# Patient Record
Sex: Male | Born: 1955 | Race: White | Hispanic: No | Marital: Married | Smoking: Current every day smoker
Health system: Southern US, Community
[De-identification: ages and names within clinical notes are randomized; demographics above are authoritative.]

## PROBLEM LIST (undated history)

## (undated) DIAGNOSIS — E298 Other testicular dysfunction: Secondary | ICD-10-CM

## (undated) DIAGNOSIS — R911 Solitary pulmonary nodule: Secondary | ICD-10-CM

## (undated) DIAGNOSIS — F32A Depression, unspecified: Secondary | ICD-10-CM

## (undated) DIAGNOSIS — G629 Polyneuropathy, unspecified: Secondary | ICD-10-CM

## (undated) DIAGNOSIS — H9319 Tinnitus, unspecified ear: Secondary | ICD-10-CM

## (undated) DIAGNOSIS — J439 Emphysema, unspecified: Secondary | ICD-10-CM

## (undated) DIAGNOSIS — J432 Centrilobular emphysema: Secondary | ICD-10-CM

## (undated) DIAGNOSIS — R0681 Apnea, not elsewhere classified: Secondary | ICD-10-CM

## (undated) DIAGNOSIS — M1990 Unspecified osteoarthritis, unspecified site: Secondary | ICD-10-CM

## (undated) DIAGNOSIS — R7301 Impaired fasting glucose: Secondary | ICD-10-CM

## (undated) DIAGNOSIS — M40209 Unspecified kyphosis, site unspecified: Secondary | ICD-10-CM

## (undated) DIAGNOSIS — K219 Gastro-esophageal reflux disease without esophagitis: Secondary | ICD-10-CM

## (undated) DIAGNOSIS — F329 Major depressive disorder, single episode, unspecified: Secondary | ICD-10-CM

## (undated) DIAGNOSIS — M419 Scoliosis, unspecified: Secondary | ICD-10-CM

## (undated) DIAGNOSIS — E7800 Pure hypercholesterolemia, unspecified: Secondary | ICD-10-CM

## (undated) DIAGNOSIS — G43909 Migraine, unspecified, not intractable, without status migrainosus: Secondary | ICD-10-CM

## (undated) DIAGNOSIS — C801 Malignant (primary) neoplasm, unspecified: Secondary | ICD-10-CM

## (undated) DIAGNOSIS — I10 Essential (primary) hypertension: Secondary | ICD-10-CM

## (undated) DIAGNOSIS — G4730 Sleep apnea, unspecified: Secondary | ICD-10-CM

## (undated) HISTORY — DX: Polyneuropathy, unspecified: G62.9

## (undated) HISTORY — DX: Migraine, unspecified, not intractable, without status migrainosus: G43.909

## (undated) HISTORY — DX: Unspecified osteoarthritis, unspecified site: M19.90

## (undated) HISTORY — DX: Pure hypercholesterolemia, unspecified: E78.00

## (undated) HISTORY — DX: Centrilobular emphysema: J43.2

## (undated) HISTORY — DX: Unspecified kyphosis, site unspecified: M40.209

## (undated) HISTORY — DX: Malignant (primary) neoplasm, unspecified: C80.1

## (undated) HISTORY — DX: Apnea, not elsewhere classified: R06.81

## (undated) HISTORY — DX: Emphysema, unspecified: J43.9

## (undated) HISTORY — DX: Impaired fasting glucose: R73.01

## (undated) HISTORY — PX: TONSILLECTOMY: SUR1361

## (undated) HISTORY — DX: Tinnitus, unspecified ear: H93.19

## (undated) HISTORY — DX: Sleep apnea, unspecified: G47.30

## (undated) HISTORY — DX: Other testicular dysfunction: E29.8

## (undated) HISTORY — DX: Depression, unspecified: F32.A

## (undated) HISTORY — DX: Solitary pulmonary nodule: R91.1

## (undated) HISTORY — DX: Scoliosis, unspecified: M41.9

## (undated) HISTORY — DX: Essential (primary) hypertension: I10

## (undated) HISTORY — DX: Gastro-esophageal reflux disease without esophagitis: K21.9

---
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
## 2013-10-23 ENCOUNTER — Ambulatory Visit (INDEPENDENT_AMBULATORY_CARE_PROVIDER_SITE_OTHER): Payer: Medicare Other | Admitting: Podiatrist

## 2013-10-23 ENCOUNTER — Encounter: Payer: Self-pay | Admitting: Podiatrist

## 2013-10-23 VITALS — BP 132/89 | HR 82 | Resp 18

## 2013-10-23 DIAGNOSIS — L600 Ingrowing nail: Secondary | ICD-10-CM

## 2013-10-23 DIAGNOSIS — M79609 Pain in unspecified limb: Secondary | ICD-10-CM

## 2013-10-23 DIAGNOSIS — B351 Tinea unguium: Secondary | ICD-10-CM

## 2013-10-23 NOTE — Progress Notes (Signed)
   Subjective:    Patient ID: Jacob Graves, male    DOB: [birth year], 58 y.o.   MRN: [id number]  HPI MY TOENAIL ON THE RIGHT BIG TOE HAS BEEN BOTHERING FOR ABOUT A WEEK AND A HALF NOW AND I USED TOENAIL CLIPPERS AND THERE IS NO BURNING OR THROBBING AND NO NUMBNESS OR TINGLING AND IT IS SORE AND TENDER AND THE NAIL IS THICK  AND I BROKE THAT RIGHT TOE 20 YEARS AGO AND IT FELL OFF AND GREW BACK    Review of Systems  Constitutional:       NIGHT SWEATS  HENT: Positive for hearing loss.   Musculoskeletal:       MUSCLE PAIN   Skin: Positive for rash.  Neurological: Positive for headaches.  All other systems reviewed and are negative.      Objective:   Physical Exam Patient is awake, alert, and oriented x 3.  In no acute distress.  Vascular status is intact with palpable pedal pulses at 2/4 DP and PT bilateral and capillary refill time within normal limits. Neurological sensation is also intact bilaterally via Semmes Weinstein monofilament at 5/5 sites. Light touch, vibratory sensation, Achilles tendon reflex is intact. Dermatological exam reveals skin color, turger and texture as normal. No open lesions present.  Musculature intact with dorsiflexion, plantarflexion, inversion, eversion.  Mild discomfort medial aspect of the right great toenail is present. Thickness, discoloration, dystrophy and laterally deviated hallux nail is present. No active sign of infection is noted. Thick on the right second toe is also present.     Assessment & Plan:  Ingrowing toenail, mycotic toenail  Plan: Recommended debridement with thinning of the toenail at today's visit. Discussed that this is not beneficial we'll consider removal part of the toenail. He'll be seen back as needed for followup or if today's visit did offer relief.

## 2013-10-23 NOTE — Patient Instructions (Signed)
Ingrown Toenail An ingrown toenail occurs when the sharp edge of your toenail grows into the skin. Causes of ingrown toenails include toenails clipped too far back or poorly fitting shoes. Activities involving sudden stops (basketball, tennis) causing "toe jamming" may lead to an ingrown nail. HOME CARE INSTRUCTIONS   Soak the whole foot in warm soapy water for 20 minutes, 3 times per day.  You may lift the edge of the nail away from the sore skin by wedging a small piece of cotton under the corner of the nail. Be careful not to dig (traumatize) and cause more injury to the area.  Wear shoes that fit well. While the ingrown nail is causing problems, sandals may be beneficial.  Trim your toenails regularly and carefully. Cut your toenails straight across, not in a curve. This will prevent injury to the skin at the corners of the toenail.  Keep your feet clean and dry.  Crutches may be helpful early in treatment if walking is painful.  Antibiotics, if prescribed, should be taken as directed.  Return for a wound check in 2 days or as directed.  Only take over-the-counter or prescription medicines for pain, discomfort, or fever as directed by your caregiver. SEEK IMMEDIATE MEDICAL CARE IF:   You have a fever.  You have increasing pain, redness, swelling, or heat at the wound site.  Your toe is not better in 7 days. If conservative treatment is not successful, surgical removal of a portion or all of the nail may be necessary. MAKE SURE YOU:   Understand these instructions.  Will watch your condition.  Will get help right away if you are not doing well or get worse. Document Released: 02/13/2000 Document Revised: 05/10/2011 Document Reviewed: 02/07/2008 Exit[employer] Patient Information 2015 [employer]. This information is not intended to replace advice given to you by your health care provider. Make sure you discuss any questions you have with your health care provider.  

## 2014-03-01 HISTORY — PX: COLONOSCOPY: SHX174

## 2014-05-31 LAB — HM COLONOSCOPY

## 2015-07-08 ENCOUNTER — Encounter (INDEPENDENT_AMBULATORY_CARE_PROVIDER_SITE_OTHER): Payer: Self-pay

## 2015-07-08 ENCOUNTER — Ambulatory Visit (INDEPENDENT_AMBULATORY_CARE_PROVIDER_SITE_OTHER): Payer: Medicare Other | Admitting: Internal Medicine

## 2015-07-08 ENCOUNTER — Encounter: Payer: Self-pay | Admitting: Internal Medicine

## 2015-07-08 VITALS — BP 128/76 | HR 101 | Ht 70.0 in | Wt 161.2 lb

## 2015-07-08 DIAGNOSIS — J432 Centrilobular emphysema: Secondary | ICD-10-CM

## 2015-07-08 DIAGNOSIS — R911 Solitary pulmonary nodule: Secondary | ICD-10-CM

## 2015-07-08 DIAGNOSIS — Z720 Tobacco use: Secondary | ICD-10-CM | POA: Diagnosis not present

## 2015-07-08 DIAGNOSIS — Z87891 Personal history of nicotine dependence: Secondary | ICD-10-CM | POA: Insufficient documentation

## 2015-07-08 HISTORY — DX: Centrilobular emphysema: J43.2

## 2015-07-08 NOTE — Addendum Note (Signed)
Addended by: Osa Craver on: 07/08/2015 04:38 PM   Modules accepted: Orders

## 2015-07-08 NOTE — Progress Notes (Signed)
Subjective:    Patient ID: Jacob Graves, male    DOB: 04/09/1955, 60 y.o.   MRN: [id number] PCP Rochel Brome, MD  HPI   IOV 07/08/2015  Chief Complaint  Patient presents with  . Pulmonary Consult    Pt self referred for left lower lung nodule. Pt c/o sinus drainage, DOE, prod cough with clear mucus. Pt denies CP/tightness.      60 year old 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 He is a chronic smoker. He has sleep apnea and uses CPAP. His chronic cough and mild chronic shortness of breath and he has never paid attention to this. Most recently with the spring season his cough got a little bit worse or excess primary care physician present resulted in a chest x-ray was then led to a CT scan of the chest a few weeks ago he did this was done locally. I personally visualized the film on a CT from. He shows centrilobular emphysema along with a 1 cm smooth left lower lobe nodule. Therefore he is been referred for follow-up. He took a sample of Brio for a few weeks with due to his cough and it did help him but his wife says that he has significant cough that day and night.  He is a smoker. He tried to quit in the past with Chantix. This helped reduce the amount of cigarettes. However he had depression and his primary care physician a month ago put him on Zoloft), and took him off Chantix. He is back up on his cigarettes now. Some point he is interested in quitting smoking.     has a past medical history of Arthritis; Cancer ([place]); Sleep apnea; and Emphysema of lung ([place]).   reports that he has been smoking Cigarettes.  He has a 40 pack-year smoking history. He has never used smokeless tobacco.  Past Surgical History  Procedure Laterality Date  . Tonsillectomy      Allergies  Allergen Reactions  . Codeine   . Penicillins     Immunization History  Administered  Date(s) Administered  . Influenza,inj,Quad PF,36+ Mos 09/30/2014    Family History  Problem Relation Age of Onset  . Heart disease Father   . Stroke Father   . Breast cancer Maternal Grandmother   . Leukemia Mother   . Skin cancer Mother   . Skin cancer Brother      Current outpatient prescriptions:  .  atorvastatin (LIPITOR) 40 MG tablet, Take 40 mg by mouth daily., Disp: , Rfl:  .  baclofen (LIORESAL) 10 MG tablet, Take 10 mg by mouth 3 (three) times daily as needed. , Disp: , Rfl:  .  cetirizine (ZYRTEC) 10 MG tablet, Take 10 mg by mouth daily., Disp: , Rfl:  .  fluticasone furoate-vilanterol (BREO ELLIPTA) 100-25 MCG/INH AEPB, Inhale 1 puff into the lungs daily., Disp: , Rfl:  .  omeprazole (PRILOSEC) 20 MG capsule, Take 20 mg by mouth daily., Disp: , Rfl:  .  oxyCODONE (ROXICODONE) 15 MG immediate release tablet, Take 20 mg by mouth every 4 (four) hours as needed for pain., Disp: , Rfl:  .  Pseudoephedrine HCl, Deter, (NEXAFED) 30 MG TABA, Take by mouth as needed., Disp: , Rfl:  .  sertraline (ZOLOFT) 100 MG tablet, Take 100 mg by mouth daily., Disp: , Rfl:      Review of Systems  Constitutional: Negative for fever and unexpected weight change.  HENT: Positive for congestion. Negative for dental problem, ear pain, nosebleeds, postnasal drip, rhinorrhea, sinus pressure, sneezing, sore throat and trouble swallowing.   Eyes: Negative for redness and itching.  Respiratory: Positive for cough. Negative for chest tightness, shortness of breath and wheezing.   Cardiovascular: Negative for palpitations and leg swelling.  Gastrointestinal: Negative for nausea and vomiting.  Genitourinary: Negative for dysuria.  Musculoskeletal: Negative for joint swelling.  Skin: Negative for rash.  Neurological: Negative for headaches.  Hematological: Does not bruise/bleed easily.  Psychiatric/Behavioral: Negative for dysphoric mood. The patient is not nervous/anxious.        Objective:    Physical Exam  Constitutional: He is oriented to person, place, and time. He appears well-developed and well-nourished. No distress.  HENT:  Head: Normocephalic and atraumatic.  Right Ear: External ear normal.  Left Ear: External ear normal.  Mouth/Throat: Oropharynx is clear and moist. No oropharyngeal exudate.  Eyes: Conjunctivae and EOM are normal. Pupils are equal, round, and reactive to light. Right eye exhibits no discharge. Left eye exhibits no discharge. No scleral icterus.  Neck: Normal range of motion. Neck supple. No JVD present. No tracheal deviation present. No thyromegaly present.  Cardiovascular: Normal rate, regular rhythm and intact distal pulses.  Exam reveals no gallop and no friction rub.   No murmur heard. Pulmonary/Chest: Effort normal and breath sounds normal. No respiratory distress. He has no wheezes. He has no rales. He exhibits no tenderness.  Coarse BS   Abdominal: Soft. Bowel sounds are normal. He exhibits no distension and no mass. There is no tenderness. There is no rebound and no guarding.  Musculoskeletal: Normal range of motion. He exhibits no edema or tenderness.  Mildly kyphotic  Lymphadenopathy:    He has no cervical adenopathy.  Neurological: He is alert and oriented to person, place, and time. He has normal reflexes. No cranial nerve deficit. Coordination normal.  Skin: Skin is warm and dry. No rash noted. He is not diaphoretic. No erythema. No pallor.  Psychiatric: He has a normal mood and affect. His behavior is normal. Judgment and thought content normal.  flast affect  Nursing note and vitals reviewed.   Filed Vitals:   07/08/15 1523  BP: 128/76  Pulse: 101  Height: 5\' 10"  (1.778 m)  Weight: 161 lb 3.2 oz (73.12 kg)  SpO2: 95%         Assessment & Plan:     ICD-9-CM ICD-10-CM   1. Nodule of left lung 793.11 R91.1   2. Smoking history V15.82 Z72.0   3. Centrilobular emphysema (HCC) 492.8 J43.2     #lung nodule  = age and smoking  history make the nodule concerning for early stage lung cancer - however, smooth shape and small size make it less concerning for lung cancer  - still we cannot be absolutely certain if this is lung cancer or not - do PET scan to help [place] with probability assessment  #Smoking history annd cough and emphysema seen on CT  - do full PFT - at [place] if you want - will discuss quit smoking at followup - consider alpha 1 at followp  #Followup =- return to see me or one my nurse practitioners next 10 days but after completing above    Dr. Brand Males, M.D., [facility].C.P Pulmonary and Critical Care Medicine Staff Physician [place] [facility] Pager: [phone], If no answer or between  15:00h -  7:00h: call [phone]  07/08/2015 4:03 PM

## 2015-07-08 NOTE — Patient Instructions (Addendum)
ICD-9-CM ICD-10-CM   1. Nodule of left lung 793.11 R91.1   2. Smoking history V15.82 Z72.0   3. Centrilobular emphysema (HCC) 492.8 J43.2    #lung nodule  = age and smoking history make the nodule concerning for early stage lung cancer - however, smooth shape and small size make it less concerning for lung cancer  - still we cannot be absolutely certain if this is lung cancer or not - do PET scan to help [place] with probability assessment  #Smoking history annd cough and emphysema seen on CT  - do full PFT - at [place] if you want will discuss quit smoking at followup - consider alpha 1 at followp    #Followup =- return to see me or one my nurse practitioners next 10 days but after completing above

## 2015-07-15 ENCOUNTER — Ambulatory Visit (HOSPITAL_COMMUNITY)
Admission: RE | Admit: 2015-07-15 | Discharge: 2015-07-15 | Disposition: A | Discharge status: Home / Self Care | Payer: Medicare Other | Source: Ambulatory Visit | Attending: Internal Medicine | Admitting: Internal Medicine

## 2015-07-15 DIAGNOSIS — Z720 Tobacco use: Secondary | ICD-10-CM | POA: Insufficient documentation

## 2015-07-15 DIAGNOSIS — J432 Centrilobular emphysema: Secondary | ICD-10-CM

## 2015-07-15 DIAGNOSIS — Z87891 Personal history of nicotine dependence: Secondary | ICD-10-CM

## 2015-07-15 DIAGNOSIS — R911 Solitary pulmonary nodule: Secondary | ICD-10-CM | POA: Diagnosis not present

## 2015-07-15 DIAGNOSIS — J449 Chronic obstructive pulmonary disease, unspecified: Secondary | ICD-10-CM | POA: Insufficient documentation

## 2015-07-15 LAB — PULMONARY FUNCTION TEST
DL/VA % pred: 112 %
DL/VA: 5.21 ml/min/mmHg/L
DLCO unc % pred: 71 %
DLCO unc: 22.97 ml/min/mmHg
FEF 25-75 Post: 0.95 L/sec
FEF 25-75 Pre: 1.28 L/sec
FEF2575-%Change-Post: -25 %
FEF2575-%Pred-Post: 30 %
FEF2575-%Pred-Pre: 41 %
FEV1-%CHANGE-POST: -20 %
FEV1-%PRED-POST: 48 %
FEV1-%PRED-PRE: 61 %
FEV1-PRE: 2.25 L
FEV1-Post: 1.78 L
FEV1FVC-%Change-Post: -16 %
FEV1FVC-%Pred-Pre: 84 %
FEV6-%Change-Post: -4 %
FEV6-%PRED-PRE: 74 %
FEV6-%Pred-Post: 71 %
FEV6-POST: 3.31 L
FEV6-Pre: 3.45 L
FEV6FVC-%Change-Post: 1 %
FEV6FVC-%PRED-POST: 103 %
FEV6FVC-%Pred-Pre: 102 %
FVC-%Change-Post: -5 %
FVC-%PRED-PRE: 72 %
FVC-%Pred-Post: 68 %
FVC-POST: 3.33 L
FVC-PRE: 3.51 L
POST FEV6/FVC RATIO: 99 %
PRE FEV1/FVC RATIO: 64 %
PRE FEV6/FVC RATIO: 98 %
Post FEV1/FVC ratio: 54 %
RV % pred: 218 %
RV: 4.81 L
TLC % PRED: 115 %
TLC: 8.04 L

## 2015-07-15 LAB — GLUCOSE, CAPILLARY: Glucose-Capillary: 131 mg/dL — ABNORMAL HIGH (ref 65–99)

## 2015-07-15 MED ORDER — FLUDEOXYGLUCOSE F - 18 (FDG) INJECTION
7.7000 | Freq: Once | INTRAVENOUS | Status: AC | PRN
  Administered 2015-07-15: 7.7 via INTRAVENOUS

## 2015-07-15 MED ORDER — ALBUTEROL SULFATE (2.5 MG/3ML) 0.083% IN NEBU
2.5000 mg | INHALATION_SOLUTION | Freq: Once | RESPIRATORY_TRACT | Status: AC
  Administered 2015-07-15: 2.5 mg via RESPIRATORY_TRACT

## 2015-07-16 ENCOUNTER — Telehealth: Payer: Self-pay | Admitting: Internal Medicine

## 2015-07-16 NOTE — Telephone Encounter (Signed)
Spoke with pt and gave results and recommendations. MR happened to have cancellation opening on schedule for tomorrow so pt is coming in 07/17/15 @ 9:30am.  Routed to MR as FYI.

## 2015-07-16 NOTE — Telephone Encounter (Addendum)
  Triage: Let Trude Mcburney know that PET scan has low probability (but not zero) for cancer. Also, I might have him see [place] or myself for fu to decide on watching v doing ENB. This is instead of TP 07/22/15. When is my first avail? When is RB first avail?   Also PFT - fev1 61% - modereate copd.   Nm Pet Image Initial (pi) Skull Base To Thigh  07/16/2015  CLINICAL DATA:  Initial initial treatment strategy for pulmonary nodule. EXAM: NUCLEAR MEDICINE PET SKULL BASE TO THIGH TECHNIQUE: 7.7 mCi F-18 FDG was injected intravenously. Full-ring PET imaging was performed from the skull base to thigh after the radiotracer. CT data was obtained and used for attenuation correction and anatomic localization. FASTING BLOOD GLUCOSE:  Value: 131 mg/dl COMPARISON:  CT 06/26/2015 FINDINGS: NECK No hypermetabolic lymph nodes in the neck. CHEST The nodule of concern in the LEFT lower lobe measures 10 mm (image 55, series 6) and has faint associated metabolic activity (SUV max equals 0.9). No additional suspicious pulmonary nodules. No hypermetabolic mediastinal lymph nodes. ABDOMEN/PELVIS No abnormal hypermetabolic activity within the liver, pancreas, adrenal glands, or spleen. No hypermetabolic lymph nodes in the abdomen or pelvis. SKELETON No focal hypermetabolic activity to suggest skeletal metastasis. Round sclerotic lesion in the RIGHT iliac bone measures 7 mm. This does not have associated metabolic activity and therefore benign. IMPRESSION: 1. Very low metabolic activity in the LEFT lower lobe pulmonary nodule suggest benign etiology. As some low-grade neoplasms can have low metabolic activity, recommend follow-up CT in 3 months to demonstrate volume stability. 2. No evidence of metastatic disease. Electronically Signed   By: Suzy Bouchard M.D.   On: 07/16/2015 10:39

## 2015-07-16 NOTE — Telephone Encounter (Signed)
Thanks

## 2015-07-17 ENCOUNTER — Other Ambulatory Visit: Payer: Medicare Other

## 2015-07-17 ENCOUNTER — Ambulatory Visit (INDEPENDENT_AMBULATORY_CARE_PROVIDER_SITE_OTHER): Payer: Medicare Other | Admitting: Internal Medicine

## 2015-07-17 ENCOUNTER — Encounter: Payer: Self-pay | Admitting: Internal Medicine

## 2015-07-17 VITALS — BP 128/78 | HR 91 | Ht 70.0 in | Wt 162.2 lb

## 2015-07-17 DIAGNOSIS — R911 Solitary pulmonary nodule: Secondary | ICD-10-CM | POA: Diagnosis not present

## 2015-07-17 DIAGNOSIS — Z720 Tobacco use: Secondary | ICD-10-CM

## 2015-07-17 DIAGNOSIS — Z23 Encounter for immunization: Secondary | ICD-10-CM | POA: Diagnosis not present

## 2015-07-17 DIAGNOSIS — Z87891 Personal history of nicotine dependence: Secondary | ICD-10-CM

## 2015-07-17 DIAGNOSIS — J449 Chronic obstructive pulmonary disease, unspecified: Secondary | ICD-10-CM

## 2015-07-17 NOTE — Progress Notes (Signed)
PCP Rochel Brome, MD  HPI   IOV 07/08/2015  Chief Complaint  Patient presents with  . Pulmonary Consult    Pt self referred for left lower lung nodule. Pt c/o sinus drainage, DOE, prod cough with clear mucus. Pt denies CP/tightness.      60 year old male [place]. 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 He is a chronic smoker. He has sleep apnea and uses CPAP. His chronic cough and mild chronic shortness of breath and he has never paid attention to this. Most recently with the spring season his cough got a little bit worse or excess primary care physician present resulted in a chest x-ray was then led to a CT scan of the chest a few weeks ago he did this was done locally. I personally visualized the film on a CT from. He shows centrilobular emphysema along with a 1 cm smooth left lower lobe nodule. Therefore he is been referred for follow-up. He took a sample of Brio for a few weeks with due to his cough and it did help him but his wife says that he has significant cough that day and night.  He is a smoker. He tried to quit in the past with Chantix. This helped reduce the amount of cigarettes. However he had depression and his primary care physician a month ago put him on Zoloft), and took him off Chantix. He is back up on his cigarettes now. Some point he is interested in quitting smoking.   OV 07/17/2015  Chief Complaint  Patient presents with  . Follow-up    PET scan review. Pt c/o occasional cough and sinus drainage, some SOB with exertion. Pt denies wheeze/CP/tightness. Pt has had continued weight loss >83mos.     Presents with his wife to review results and to discuss smoking  #Smoking: He continues to smoke. It is very difficult for him to quit. He is not taking Chantix because his primary care physician put him on Zoloft and did not want to add Chantix to this. He has tried  Wellbutrin in the past. He is willing to wean down to the extent possible his smoking and review this in 3 months at follow-up. He has no suicidal or homicidal behavior  #COPD: PFTs reviewed shows moderate COPD with low DLCO of 70%. He is only on albuterol as needed, He has not had an alpha 1 check  Immunization History  Administered Date(s) Administered  . Influenza,inj,Quad PF,36+ Mos 09/30/2014    #Lung nodule = PET scan as follows   Nm Pet Image Initial (pi) Skull Base To Thigh  07/16/2015  CLINICAL DATA:  Initial initial treatment strategy for pulmonary nodule. EXAM: NUCLEAR MEDICINE PET SKULL BASE TO THIGH TECHNIQUE: 7.7 mCi F-18 FDG was injected intravenously. Full-ring PET imaging was performed from the skull base to thigh after the radiotracer. CT data was obtained and used for attenuation correction and anatomic localization. FASTING BLOOD GLUCOSE:  Value: 131 mg/dl COMPARISON:  CT 06/26/2015 FINDINGS: NECK No hypermetabolic lymph nodes in the neck. CHEST The nodule of concern in the LEFT lower lobe measures 10 mm (image 55, series 6) and has faint associated metabolic activity (SUV max equals 0.9). No additional suspicious pulmonary nodules. No hypermetabolic mediastinal lymph nodes. ABDOMEN/PELVIS No abnormal hypermetabolic activity within the liver, pancreas, adrenal glands, or spleen. No hypermetabolic lymph nodes in the abdomen or pelvis. SKELETON No focal  hypermetabolic activity to suggest skeletal metastasis. Round sclerotic lesion in the RIGHT iliac bone measures 7 mm. This does not have associated metabolic activity and therefore benign. IMPRESSION: 1. Very low metabolic activity in the LEFT lower lobe pulmonary nodule suggest benign etiology. As some low-grade neoplasms can have low metabolic activity, recommend follow-up CT in 3 months to demonstrate volume stability. 2. No evidence of metastatic disease. Electronically Signed   By: Suzy Bouchard M.D.   On: 07/16/2015 10:39         Current outpatient prescriptions:  .  atorvastatin (LIPITOR) 40 MG tablet, Take 40 mg by mouth daily., Disp: , Rfl:  .  baclofen (LIORESAL) 10 MG tablet, Take 10 mg by mouth 3 (three) times daily as needed. , Disp: , Rfl:  .  cetirizine (ZYRTEC) 10 MG tablet, Take 10 mg by mouth daily., Disp: , Rfl:  .  fluticasone furoate-vilanterol (BREO ELLIPTA) 100-25 MCG/INH AEPB, Inhale 1 puff into the lungs daily., Disp: , Rfl:  .  levofloxacin (LEVAQUIN) 750 MG tablet, Take 750 mg by mouth daily., Disp: , Rfl:  .  omeprazole (PRILOSEC) 20 MG capsule, Take 20 mg by mouth daily., Disp: , Rfl:  .  Oxycodone HCl 20 MG TABS, Take 20 mg by mouth every 4 (four) hours as needed., Disp: , Rfl:  .  predniSONE (STERAPRED UNI-PAK 21 TAB) 10 MG (21) TBPK tablet, Take 10 mg by mouth daily., Disp: , Rfl:  .  Pseudoephedrine HCl, Deter, (NEXAFED) 30 MG TABA, Take by mouth as needed., Disp: , Rfl:  .  sertraline (ZOLOFT) 100 MG tablet, Take 100 mg by mouth daily., Disp: , Rfl:     Allergies  Allergen Reactions  . Codeine   . Penicillins       OVJECTIVE Filed Vitals:   07/17/15 0943  BP: 128/78  Pulse: 91  Height: 5\' 10"  (1.778 m)  Weight: 162 lb 3.2 oz (73.573 kg)  SpO2: 99%   Discussion only visit   ASSSESSMENT/PLAN    ICD-9-CM ICD-10-CM   1. Nodule of left lung 793.11 R91.1   2. Smoking history V15.82 Z72.0   3. COPD, moderate (HCC) 496 J44.9       #Left lung nodule - Based on PET scan low probability that this is cancer but we need to keep an eye - Repeat CT scan of chest tumor dimension protocol without contrast in 3 months  #Smoking history - Try cutting down on cigarettes by yourself - We will discuss more on this including adding Chantix to . Zoloft in 3 months  #Moderate COPD - Glad recent flareup is resolving - Continue albuterol as needed -Start aNORO daily - check alpha 1 - take prevnar vaccine 07/17/2015    #Follow-up - CT chest without contrast of her  dimension protocol in 3 months - Return to see me or my nurse practitioner in 3 months  - COPD cat score at follow-up   > 50% of this > 25 min visit spent in face to face counseling or coordination of care    Dr. Brand Males, M.D., [facility].C.P Pulmonary and Critical Care Medicine Staff Physician [place] [facility] Pager: [phone], If no answer or between  15:00h - 7:00h: call [phone]  07/17/2015 10:16 AM

## 2015-07-17 NOTE — Patient Instructions (Addendum)
ICD-9-CM ICD-10-CM   1. Nodule of left lung 793.11 R91.1   2. Smoking history V15.82 Z72.0   3. COPD, moderate (HCC) 496 J44.9     #Left lung nodule - Based on PET scan low probability that this is cancer but we need to keep an eye - Repeat CT scan of chest tumor dimension protocol without contrast in 3 months  #Smoking history - Try cutting down on cigarettes by yourself - We will discuss more on this including adding Chantix to . Zoloft in 3 months  #Moderate COPD - Glad recent flareup is resolving - Continue albuterol as needed -Start aNORO daily - 6Check alpha 1 - Prevnar vaccine today  #Follow-up - CT chest without contrast of her dimension protocol in 3 months - Return to see me or my nurse practitioner in 3 months  - COPD cat score at follow-up

## 2015-07-17 NOTE — Progress Notes (Signed)
Patient seen in the office today and instructed on use of Anoro.  Patient expressed understanding and demonstrated technique. 

## 2015-07-17 NOTE — Addendum Note (Signed)
Addended by: Wynn Banker H on: 07/17/2015 11:31 AM   Modules accepted: Orders

## 2015-07-22 ENCOUNTER — Ambulatory Visit: Payer: Medicare Other | Admitting: Adult Health

## 2015-07-22 LAB — ALPHA-1 ANTITRYPSIN PHENOTYPE: A-1 Antitrypsin: 137 mg/dL (ref 83–199)

## 2015-10-17 ENCOUNTER — Other Ambulatory Visit: Payer: Medicare Other

## 2015-10-20 ENCOUNTER — Ambulatory Visit: Payer: Medicare Other | Admitting: Internal Medicine

## 2016-01-29 DIAGNOSIS — M48061 Spinal stenosis, lumbar region without neurogenic claudication: Secondary | ICD-10-CM | POA: Diagnosis not present

## 2016-01-29 DIAGNOSIS — F1120 Opioid dependence, uncomplicated: Secondary | ICD-10-CM | POA: Diagnosis not present

## 2016-01-29 DIAGNOSIS — M5416 Radiculopathy, lumbar region: Secondary | ICD-10-CM | POA: Diagnosis not present

## 2016-01-29 DIAGNOSIS — M5136 Other intervertebral disc degeneration, lumbar region: Secondary | ICD-10-CM | POA: Diagnosis not present

## 2016-01-29 DIAGNOSIS — Z720 Tobacco use: Secondary | ICD-10-CM | POA: Diagnosis not present

## 2016-01-29 DIAGNOSIS — G894 Chronic pain syndrome: Secondary | ICD-10-CM | POA: Diagnosis not present

## 2016-03-04 DIAGNOSIS — R7301 Impaired fasting glucose: Secondary | ICD-10-CM | POA: Diagnosis not present

## 2016-03-04 DIAGNOSIS — F321 Major depressive disorder, single episode, moderate: Secondary | ICD-10-CM | POA: Diagnosis not present

## 2016-03-04 DIAGNOSIS — J410 Simple chronic bronchitis: Secondary | ICD-10-CM | POA: Diagnosis not present

## 2016-03-04 DIAGNOSIS — G4733 Obstructive sleep apnea (adult) (pediatric): Secondary | ICD-10-CM | POA: Diagnosis not present

## 2016-03-04 DIAGNOSIS — I10 Essential (primary) hypertension: Secondary | ICD-10-CM | POA: Diagnosis not present

## 2016-03-04 DIAGNOSIS — E782 Mixed hyperlipidemia: Secondary | ICD-10-CM | POA: Diagnosis not present

## 2016-03-04 DIAGNOSIS — M545 Low back pain: Secondary | ICD-10-CM | POA: Diagnosis not present

## 2016-03-04 DIAGNOSIS — R5383 Other fatigue: Secondary | ICD-10-CM | POA: Diagnosis not present

## 2016-03-15 DIAGNOSIS — M48061 Spinal stenosis, lumbar region without neurogenic claudication: Secondary | ICD-10-CM | POA: Diagnosis not present

## 2016-03-15 DIAGNOSIS — M5136 Other intervertebral disc degeneration, lumbar region: Secondary | ICD-10-CM | POA: Diagnosis not present

## 2016-03-15 DIAGNOSIS — Z720 Tobacco use: Secondary | ICD-10-CM | POA: Diagnosis not present

## 2016-03-15 DIAGNOSIS — M5416 Radiculopathy, lumbar region: Secondary | ICD-10-CM | POA: Diagnosis not present

## 2016-03-15 DIAGNOSIS — G894 Chronic pain syndrome: Secondary | ICD-10-CM | POA: Diagnosis not present

## 2016-03-15 DIAGNOSIS — F1120 Opioid dependence, uncomplicated: Secondary | ICD-10-CM | POA: Diagnosis not present

## 2016-04-12 DIAGNOSIS — Z720 Tobacco use: Secondary | ICD-10-CM | POA: Diagnosis not present

## 2016-04-12 DIAGNOSIS — F1120 Opioid dependence, uncomplicated: Secondary | ICD-10-CM | POA: Diagnosis not present

## 2016-04-12 DIAGNOSIS — M5136 Other intervertebral disc degeneration, lumbar region: Secondary | ICD-10-CM | POA: Diagnosis not present

## 2016-04-12 DIAGNOSIS — M5416 Radiculopathy, lumbar region: Secondary | ICD-10-CM | POA: Diagnosis not present

## 2016-04-12 DIAGNOSIS — G894 Chronic pain syndrome: Secondary | ICD-10-CM | POA: Diagnosis not present

## 2016-05-10 DIAGNOSIS — M5416 Radiculopathy, lumbar region: Secondary | ICD-10-CM | POA: Diagnosis not present

## 2016-05-10 DIAGNOSIS — F1120 Opioid dependence, uncomplicated: Secondary | ICD-10-CM | POA: Diagnosis not present

## 2016-05-10 DIAGNOSIS — Z720 Tobacco use: Secondary | ICD-10-CM | POA: Diagnosis not present

## 2016-05-10 DIAGNOSIS — G894 Chronic pain syndrome: Secondary | ICD-10-CM | POA: Diagnosis not present

## 2016-05-10 DIAGNOSIS — M5136 Other intervertebral disc degeneration, lumbar region: Secondary | ICD-10-CM | POA: Diagnosis not present

## 2016-06-07 DIAGNOSIS — Z720 Tobacco use: Secondary | ICD-10-CM | POA: Diagnosis not present

## 2016-06-07 DIAGNOSIS — M5136 Other intervertebral disc degeneration, lumbar region: Secondary | ICD-10-CM | POA: Diagnosis not present

## 2016-06-07 DIAGNOSIS — G894 Chronic pain syndrome: Secondary | ICD-10-CM | POA: Diagnosis not present

## 2016-06-07 DIAGNOSIS — F1120 Opioid dependence, uncomplicated: Secondary | ICD-10-CM | POA: Diagnosis not present

## 2016-06-07 DIAGNOSIS — M5416 Radiculopathy, lumbar region: Secondary | ICD-10-CM | POA: Diagnosis not present

## 2016-06-21 DIAGNOSIS — I10 Essential (primary) hypertension: Secondary | ICD-10-CM | POA: Diagnosis not present

## 2016-06-21 DIAGNOSIS — E782 Mixed hyperlipidemia: Secondary | ICD-10-CM | POA: Diagnosis not present

## 2016-06-21 DIAGNOSIS — R7301 Impaired fasting glucose: Secondary | ICD-10-CM | POA: Diagnosis not present

## 2016-07-05 DIAGNOSIS — F1120 Opioid dependence, uncomplicated: Secondary | ICD-10-CM | POA: Diagnosis not present

## 2016-07-05 DIAGNOSIS — Z79891 Long term (current) use of opiate analgesic: Secondary | ICD-10-CM | POA: Diagnosis not present

## 2016-07-05 DIAGNOSIS — M5416 Radiculopathy, lumbar region: Secondary | ICD-10-CM | POA: Diagnosis not present

## 2016-07-05 DIAGNOSIS — Z720 Tobacco use: Secondary | ICD-10-CM | POA: Diagnosis not present

## 2016-07-05 DIAGNOSIS — G894 Chronic pain syndrome: Secondary | ICD-10-CM | POA: Diagnosis not present

## 2016-07-05 DIAGNOSIS — M5136 Other intervertebral disc degeneration, lumbar region: Secondary | ICD-10-CM | POA: Diagnosis not present

## 2016-07-07 DIAGNOSIS — F17200 Nicotine dependence, unspecified, uncomplicated: Secondary | ICD-10-CM | POA: Diagnosis not present

## 2016-07-07 DIAGNOSIS — Z0000 Encounter for general adult medical examination without abnormal findings: Secondary | ICD-10-CM | POA: Diagnosis not present

## 2016-07-23 ENCOUNTER — Other Ambulatory Visit (HOSPITAL_COMMUNITY): Payer: Self-pay | Admitting: Otolaryngology

## 2016-07-23 DIAGNOSIS — H903 Sensorineural hearing loss, bilateral: Secondary | ICD-10-CM | POA: Diagnosis not present

## 2016-07-23 DIAGNOSIS — H9312 Tinnitus, left ear: Secondary | ICD-10-CM

## 2016-08-02 DIAGNOSIS — M5136 Other intervertebral disc degeneration, lumbar region: Secondary | ICD-10-CM | POA: Diagnosis not present

## 2016-08-02 DIAGNOSIS — Z720 Tobacco use: Secondary | ICD-10-CM | POA: Diagnosis not present

## 2016-08-02 DIAGNOSIS — Z79891 Long term (current) use of opiate analgesic: Secondary | ICD-10-CM | POA: Diagnosis not present

## 2016-08-02 DIAGNOSIS — F1120 Opioid dependence, uncomplicated: Secondary | ICD-10-CM | POA: Diagnosis not present

## 2016-08-02 DIAGNOSIS — M5416 Radiculopathy, lumbar region: Secondary | ICD-10-CM | POA: Diagnosis not present

## 2016-08-02 DIAGNOSIS — G894 Chronic pain syndrome: Secondary | ICD-10-CM | POA: Diagnosis not present

## 2016-08-04 ENCOUNTER — Ambulatory Visit (HOSPITAL_COMMUNITY)
Admission: RE | Admit: 2016-08-04 | Discharge: 2016-08-04 | Disposition: A | Discharge status: Home / Self Care | Payer: Medicare HMO | Source: Ambulatory Visit | Attending: Otolaryngology | Admitting: Otolaryngology

## 2016-08-04 DIAGNOSIS — H903 Sensorineural hearing loss, bilateral: Secondary | ICD-10-CM | POA: Diagnosis not present

## 2016-08-04 DIAGNOSIS — H9312 Tinnitus, left ear: Secondary | ICD-10-CM

## 2016-08-04 LAB — POCT I-STAT CREATININE: Creatinine, Ser: 1 mg/dL (ref 0.61–1.24)

## 2016-08-04 MED ORDER — GADOBENATE DIMEGLUMINE 529 MG/ML IV SOLN
20.0000 mL | Freq: Once | INTRAVENOUS | Status: AC
  Administered 2016-08-04: 15 mL via INTRAVENOUS

## 2016-08-05 ENCOUNTER — Other Ambulatory Visit: Payer: Medicare Other

## 2016-08-09 DIAGNOSIS — H9312 Tinnitus, left ear: Secondary | ICD-10-CM | POA: Diagnosis not present

## 2016-08-09 DIAGNOSIS — H903 Sensorineural hearing loss, bilateral: Secondary | ICD-10-CM | POA: Diagnosis not present

## 2016-08-18 ENCOUNTER — Encounter: Payer: Self-pay | Admitting: *Deleted

## 2016-08-20 ENCOUNTER — Encounter: Payer: Self-pay | Admitting: Neurology

## 2016-08-20 ENCOUNTER — Ambulatory Visit (INDEPENDENT_AMBULATORY_CARE_PROVIDER_SITE_OTHER): Payer: Medicare HMO | Admitting: Neurology

## 2016-08-20 VITALS — Ht 70.0 in | Wt 171.6 lb

## 2016-08-20 DIAGNOSIS — Z9114 Patient's other noncompliance with medication regimen: Secondary | ICD-10-CM

## 2016-08-20 DIAGNOSIS — H9313 Tinnitus, bilateral: Secondary | ICD-10-CM | POA: Diagnosis not present

## 2016-08-20 DIAGNOSIS — H933X2 Disorders of left acoustic nerve: Secondary | ICD-10-CM | POA: Diagnosis not present

## 2016-08-20 NOTE — Progress Notes (Signed)
[facility]    Provider:  Dr Jaynee Eagles Referring Provider: Rochel Brome, MD Primary Care Physician:  Rochel Brome, MD (Inactive)  CC:  Tinnitus  HPI:  Jacob Graves is a 61 y.o. male here as a referral from Dr. Tobie Poet for Tinnitus. PMHx tinnitus, sleep apnea, neuropathy, migraine, HTN. He has had the Tinnitus as long as he can remember. The tinnitus is in both ears and started 20 years ago and he listened to a lot of loud music led zeppelin and had some hearing changes and that is when the ringing started with hearing loss. Listened to very loud music since a teenager and concerts. It is a high pitched continuous tone. The left started getting worse about 2 months ago, progressively worsened. Used to live with it but now difficult to ignore. Affecting his hearing, he can;t hear. The hearing aids do not help. If wife sneezes loud it bothers husband. Nothing makes it worse or better. He has dizziness. He has a history of migraines and he gets then 2x a month bt he takes 2-3 goody powders a day. He takes 1000-1500mg  aspirin a day. Wife provides much information.  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 There appears to be some displacement of the cochlear nerve, but it is unclear if this contributes to tinnitus. Correlate clinically.  Normal for age cerebral volume with mild chronic microvascular ischemic change. No abnormal enhancement of the brain or meninges.  Review of Systems: Patient complains of symptoms per HPI as well as the following symptoms: hearing loss, tinnitus. Pertinent negatives and positives per HPI. All others negative.   Social History   Social History  . Marital status: Married    Spouse name: Baker Janus  . Number of children: 2  . Years of education: 12    Occupational History  . disabled    Social History Main Topics  . Smoking status: Current Every Day Smoker    Packs/day: 1.00    Years: 40.00    Types: Cigarettes  . Smokeless tobacco: Never Used     Comment: 08/20/16 10 cigs daily  . Alcohol use No  . Drug use: No  . Sexual activity: Not on file   Other Topics Concern  . Not on file   Social History Narrative   Lives with wife   Caffeine- coffee, 3 cups daily    Family History  Problem Relation Age of Onset  . Heart disease Father   . Stroke Father   . Breast cancer Maternal Grandmother   . Leukemia Mother   . Skin cancer Mother   . Diabetes Mother   . Stroke Mother   . Hypertension Mother   . Skin cancer Brother     Past Medical History:  Diagnosis Date  . Arthritis   . Arthritis    spinal arthritis  . Cancer (HCC)    melanoma, basal cell  . Centrilobular emphysema ([place]) 07/08/2015  . Emphysema of lung (HCC)    COPD  . Hypercholesterolemia   . Hypertension   . Kyphosis    mild  . Migraine   . Neuropathy   . Scoliosis   . Sleep apnea   . Tinnitus     Past Surgical History:  Procedure Laterality Date  . TONSILLECTOMY      Current Outpatient  Prescriptions  Medication Sig Dispense Refill  . atorvastatin (LIPITOR) 40 MG tablet Take 40 mg by mouth daily.    . cetirizine (ZYRTEC) 10 MG tablet Take 10 mg by mouth daily.    . Diphenhydramine-APAP, sleep, (GOODYS PM PO) Take by mouth. Two daily    . fluticasone furoate-vilanterol (BREO ELLIPTA) 100-25 MCG/INH AEPB Inhale 1 puff into the lungs daily.    Marland Kitchen omeprazole (PRILOSEC) 20 MG capsule Take 20 mg by mouth daily.    Marland Kitchen XTAMPZA ER 27 MG C12A 27 mg 2 (two) times daily.    . sertraline (ZOLOFT) 100 MG tablet Take 100 mg by mouth daily.     No current facility-administered medications for this visit.     Allergies as of 08/20/2016 - Review Complete 08/20/2016  Allergen Reaction Noted  . Codeine Nausea Only 10/23/2013  . Penicillins  10/23/2013     Vitals: Ht 5\' 10"  (1.778 m)   Wt 171 lb 9.6 oz (77.8 kg)   BMI 24.62 kg/m  Last Weight:  Wt Readings from Last 1 Encounters:  08/20/16 171 lb 9.6 oz (77.8 kg)   Last Height:   Ht Readings from Last 1 Encounters:  08/20/16 5\' 10"  (1.778 m)    Physical exam: Exam: Gen: NAD, conversant, well nourised, obese, well groomed                     CV: RRR, no MRG. No Carotid Bruits. No peripheral edema, warm, nontender Eyes: Conjunctivae clear without exudates or hemorrhage  Neuro: Detailed Neurologic Exam  Speech:    Speech is normal; fluent and spontaneous with normal comprehension.  Cognition:    The patient is oriented to person, place, and time;     recent and remote memory intact;     language fluent;     normal attention, concentration,     fund of knowledge Cranial Nerves:    The pupils are equal, round, and reactive to light. Attempted fundoscopic exam coul dnot visualize due to small pupils.  Visual fields are full to finger confrontation. Extraocular movements are intact. Trigeminal sensation is intact and the muscles of mastication are normal. The face is symmetric. The palate elevates in the midline. Hearing intact. Voice is normal. Shoulder shrug is normal. The tongue has normal motion without fasciculations.   Coordination:    No dysmetria   Gait:    Not ataxic  Motor Observation:    No asymmetry, no atrophy, and no involuntary movements noted. Tone:    Normal muscle tone.    Posture:    Posture is normal. normal erect    Strength:    Strength is V/V in the upper and lower limbs.      Sensation: intact to LT     Reflex Exam:  DTR's:    Deep tendon reflexes in the upper and lower extremities are symmetrical bilaterally.   Toes:    The toes are equivocal bilaterally.   Clonus:    Clonus is absent.      Assessment/Plan:  61 year old with chronic bilateral hearing loss and Tinnitus. Recent worsening in the left ear. 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  - He take 2-3 goody powders a day for years. I reviewed the ingredients which includes 500mg  ASA, patient has been taking 1000-1500mg  ASA daily for years which can cause ototoxicity, tinnitus,  hearing loss. Had a long discussion with patient and his wife regarding this and advised him to followup with their pain doctor.  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 It is uncertain if this is playing any role and he should stop his excessive use of ASA first.. They would still like a referral to neurosurgery.  Cc: dr Ernesto Rutherford  Sarina Ill, MD  [facility] [address] [place], [place] [address]  Phone [phone] Fax [phone]

## 2016-08-20 NOTE — Patient Instructions (Addendum)
Remember to drink plenty of fluid, eat healthy meals and do not skip any meals. Try to eat protein with a every meal and eat a healthy snack such as fruit or nuts in between meals. Try to keep a regular sleep-wake schedule and try to exercise daily, particularly in the form of walking, 20-30 minutes a day, if you can.   As far as your medications are concerned, I would like to suggest: Discussed risks of daily goody powders  As far as diagnostic testing: Will send to [place], [facility] at [facility]  I would like to see you back as needed, sooner if we need to. Please call us with any interim questions, concerns, problems, updates or refill requests.   Our phone number is [phone]. We also have an after hours call service for urgent matters and there is a physician on-call for urgent questions. For any emergencies you know to call 911 or go to the nearest emergency room  Tinnitus Tinnitus refers to hearing a sound when there is no actual source for that sound. This is often described as ringing in the ears. However, people with this condition may hear a variety of noises. A person may hear the sound in one ear or in both ears. The sounds of tinnitus can be soft, loud, or somewhere in between. Tinnitus can last for a few seconds or can be constant for days. It may go away without treatment and come back at various times. When tinnitus is constant or happens often, it can lead to other problems, such as trouble sleeping and trouble concentrating. Almost everyone experiences tinnitus at some point. Tinnitus that is long-lasting (chronic) or comes back often is a problem that may require medical attention. What are the causes? The cause of tinnitus is often not known. In some cases, it can result from other problems or conditions, including:  Exposure to loud noises from machinery, music, or other sources.  Hearing loss.  Ear or sinus infections.  Earwax buildup.  A foreign object in the  ear.  Use of certain medicines.  Use of alcohol and caffeine.  High blood pressure.  Heart diseases.  Anemia.  Allergies.  Meniere disease.  Thyroid problems.  Tumors.  An enlarged part of a weakened blood vessel (aneurysm).  What are the signs or symptoms? The main symptom of tinnitus is hearing a sound when there is no source for that sound. It may sound like:  Buzzing.  Roaring.  Ringing.  Blowing air, similar to the sound heard when you listen to a seashell.  Hissing.  Whistling.  Sizzling.  Humming.  Running water.  A sustained musical note.  How is this diagnosed? Tinnitus is diagnosed based on your symptoms. Your health care provider will do a physical exam. A comprehensive hearing exam (audiologic exam) will be done if your tinnitus:  Affects only one ear (unilateral).  Causes hearing difficulties.  Lasts 6 months or longer.  You may also need to see a health care provider who specializes in hearing disorders (audiologist). You may be asked to complete a questionnaire to determine the severity of your tinnitus. Tests may be done to help determine the cause and to rule out other conditions. These can include:  Imaging studies of your head and brain, such as: ? A CT scan. ? An MRI.  An imaging study of your blood vessels (angiogram).  How is this treated? Treating an underlying medical condition can sometimes make tinnitus go away. If your tinnitus continues, other  treatments may include:  Medicines, such as certain antidepressants or sleeping aids.  Sound generators to mask the tinnitus. These include: ? Tabletop sound machines that play relaxing sounds to help you fall asleep. ? Wearable devices that fit in your ear and play sounds or music. ? A small device that uses headphones to deliver a signal embedded in music (acoustic neural stimulation). In time, this may change the pathways of your brain and make you less sensitive to tinnitus.  This device is used for very severe cases when no other treatment is working.  Therapy and counseling to help you manage the stress of living with tinnitus.  Using hearing aids or cochlear implants, if your tinnitus is related to hearing loss.  Follow these instructions at home:  When possible, avoid being in loud places and being exposed to loud sounds.  Wear hearing protection, such as earplugs, when you are exposed to loud noises.  Do not take stimulants, such as nicotine, alcohol, or caffeine.  Practice techniques for reducing stress, such as meditation, yoga, or deep breathing.  Use a Ferrell noise machine, a humidifier, or other devices to mask the sound of tinnitus.  Sleep with your head slightly raised. This may reduce the impact of tinnitus.  Try to get plenty of rest each night. Contact a health care provider if:  You have tinnitus in just one ear.  Your tinnitus continues for 3 weeks or longer without stopping.  Home care measures are not helping.  You have tinnitus after a head injury.  You have tinnitus along with any of the following: ? Dizziness. ? Loss of balance. ? Nausea and vomiting. This information is not intended to replace advice given to you by your health care provider. Make sure you discuss any questions you have with your health care provider. Document Released: 02/15/2005 Document Revised: 10/19/2015 Document Reviewed: 07/18/2013 Elsevier Interactive Patient Education  2018 [employer].

## 2016-08-23 ENCOUNTER — Telehealth: Payer: Self-pay | Admitting: Neurology

## 2016-08-23 DIAGNOSIS — H9319 Tinnitus, unspecified ear: Secondary | ICD-10-CM | POA: Insufficient documentation

## 2016-08-23 NOTE — Telephone Encounter (Signed)
Jacob Graves, patient would like to be referred to the [facility] Tinnitus clinic but I am not sure how to order. Can you help? thanks

## 2016-08-24 NOTE — Addendum Note (Signed)
Addended by: Monte Fantasia on: 08/24/2016 02:33 PM   Modules accepted: Orders

## 2016-08-24 NOTE — Telephone Encounter (Signed)
Anderson Malta will you place a order for Dr. Jaynee Eagles please For [facility] . Telephone [phone] - fax [phone] . Thanks Hinton Dyer

## 2016-08-24 NOTE — Telephone Encounter (Signed)
Order for audiology referral placed.

## 2016-08-24 NOTE — Telephone Encounter (Signed)
Thanks [facility] referral faxed.

## 2016-08-25 ENCOUNTER — Encounter: Payer: Self-pay | Admitting: Neurology

## 2016-08-26 ENCOUNTER — Encounter: Payer: Self-pay | Admitting: Neurology

## 2016-08-27 ENCOUNTER — Encounter: Payer: Self-pay | Admitting: Neurology

## 2016-08-30 DIAGNOSIS — Z720 Tobacco use: Secondary | ICD-10-CM | POA: Diagnosis not present

## 2016-08-30 DIAGNOSIS — Z79891 Long term (current) use of opiate analgesic: Secondary | ICD-10-CM | POA: Diagnosis not present

## 2016-08-30 DIAGNOSIS — M5136 Other intervertebral disc degeneration, lumbar region: Secondary | ICD-10-CM | POA: Diagnosis not present

## 2016-08-30 DIAGNOSIS — M5416 Radiculopathy, lumbar region: Secondary | ICD-10-CM | POA: Diagnosis not present

## 2016-08-30 DIAGNOSIS — F1120 Opioid dependence, uncomplicated: Secondary | ICD-10-CM | POA: Diagnosis not present

## 2016-08-30 DIAGNOSIS — G894 Chronic pain syndrome: Secondary | ICD-10-CM | POA: Diagnosis not present

## 2016-08-31 DIAGNOSIS — L570 Actinic keratosis: Secondary | ICD-10-CM | POA: Diagnosis not present

## 2016-08-31 DIAGNOSIS — D485 Neoplasm of uncertain behavior of skin: Secondary | ICD-10-CM | POA: Diagnosis not present

## 2016-08-31 DIAGNOSIS — L300 Nummular dermatitis: Secondary | ICD-10-CM | POA: Diagnosis not present

## 2016-09-03 DIAGNOSIS — H9313 Tinnitus, bilateral: Secondary | ICD-10-CM | POA: Diagnosis not present

## 2016-09-06 ENCOUNTER — Other Ambulatory Visit: Payer: Self-pay | Admitting: Neurology

## 2016-09-06 DIAGNOSIS — Q283 Other malformations of cerebral vessels: Secondary | ICD-10-CM

## 2016-09-07 ENCOUNTER — Encounter: Payer: Self-pay | Admitting: Neurology

## 2016-09-07 ENCOUNTER — Telehealth: Payer: Self-pay | Admitting: Neurology

## 2016-09-07 NOTE — Telephone Encounter (Signed)
Patient is going to see Dr. Leone Payor Neurosurgery . Referral has been faxed.

## 2016-09-07 NOTE — Telephone Encounter (Signed)
Caryl Pina with Dr. Ronnald Ramp' office is calling regarding a referral.

## 2016-09-14 ENCOUNTER — Encounter: Payer: Self-pay | Admitting: Neurology

## 2016-09-17 DIAGNOSIS — Z720 Tobacco use: Secondary | ICD-10-CM | POA: Diagnosis not present

## 2016-09-17 DIAGNOSIS — F1120 Opioid dependence, uncomplicated: Secondary | ICD-10-CM | POA: Diagnosis not present

## 2016-09-17 DIAGNOSIS — M5416 Radiculopathy, lumbar region: Secondary | ICD-10-CM | POA: Diagnosis not present

## 2016-09-17 DIAGNOSIS — Z79891 Long term (current) use of opiate analgesic: Secondary | ICD-10-CM | POA: Diagnosis not present

## 2016-09-17 DIAGNOSIS — G894 Chronic pain syndrome: Secondary | ICD-10-CM | POA: Diagnosis not present

## 2016-09-17 DIAGNOSIS — M5136 Other intervertebral disc degeneration, lumbar region: Secondary | ICD-10-CM | POA: Diagnosis not present

## 2016-09-22 DIAGNOSIS — R930 Abnormal findings on diagnostic imaging of skull and head, not elsewhere classified: Secondary | ICD-10-CM | POA: Diagnosis not present

## 2016-09-22 DIAGNOSIS — H9313 Tinnitus, bilateral: Secondary | ICD-10-CM | POA: Diagnosis not present

## 2016-09-24 DIAGNOSIS — I10 Essential (primary) hypertension: Secondary | ICD-10-CM | POA: Diagnosis not present

## 2016-09-24 DIAGNOSIS — R7301 Impaired fasting glucose: Secondary | ICD-10-CM | POA: Diagnosis not present

## 2016-09-24 DIAGNOSIS — G894 Chronic pain syndrome: Secondary | ICD-10-CM | POA: Diagnosis not present

## 2016-09-24 DIAGNOSIS — H9312 Tinnitus, left ear: Secondary | ICD-10-CM | POA: Diagnosis not present

## 2016-09-24 DIAGNOSIS — K5903 Drug induced constipation: Secondary | ICD-10-CM | POA: Diagnosis not present

## 2016-09-24 DIAGNOSIS — J410 Simple chronic bronchitis: Secondary | ICD-10-CM | POA: Diagnosis not present

## 2016-09-24 DIAGNOSIS — R3911 Hesitancy of micturition: Secondary | ICD-10-CM | POA: Diagnosis not present

## 2016-09-24 DIAGNOSIS — E782 Mixed hyperlipidemia: Secondary | ICD-10-CM | POA: Diagnosis not present

## 2016-09-28 ENCOUNTER — Other Ambulatory Visit: Payer: Self-pay | Admitting: Neurology

## 2016-09-28 ENCOUNTER — Telehealth: Payer: Self-pay | Admitting: Neurology

## 2016-09-28 ENCOUNTER — Encounter: Payer: Self-pay | Admitting: Neurology

## 2016-09-28 DIAGNOSIS — H9319 Tinnitus, unspecified ear: Secondary | ICD-10-CM

## 2016-09-28 DIAGNOSIS — H933X9 Disorders of unspecified acoustic nerve: Secondary | ICD-10-CM

## 2016-09-28 NOTE — Telephone Encounter (Signed)
Jacob Graves, patient wants to be referred to the following place, can you take care of this? I am not sure how to order a consult for a neuro-otologist? Dr. Fenton Malling at [facility] who specializes in [place], [place] (Otolaryngology) and Otology & Neurotology.Let me know, thanks

## 2016-09-29 NOTE — Addendum Note (Signed)
Addended by: Monte Fantasia on: 09/29/2016 03:46 PM   Modules accepted: Orders

## 2016-09-29 NOTE — Telephone Encounter (Signed)
Anderson Malta , Will you order for patient and I will call and get him scheduled.

## 2016-09-29 NOTE — Telephone Encounter (Signed)
Referral order entered

## 2016-09-30 NOTE — Telephone Encounter (Signed)
Called Dr. Waynard Edwards office and per there request  I need to fax hearing test done buy Dr. Ernesto Rutherford to attention [facility] Caffey telephone [phone] - fax [phone] . I have spoke to patient' s  wife and she will e-mail results today and I will send to [facility] for Dr. Waynard Edwards review.

## 2016-09-30 NOTE — Telephone Encounter (Signed)
Thanks! He was also seen at [facility] who recommended seeing this doctor, we can fax those notes if they are in [place]? Otherwise tell patient to get copies and bring with her

## 2016-10-04 NOTE — Telephone Encounter (Signed)
Auto test has been sent.

## 2016-10-14 DIAGNOSIS — M5416 Radiculopathy, lumbar region: Secondary | ICD-10-CM | POA: Diagnosis not present

## 2016-10-14 DIAGNOSIS — M5136 Other intervertebral disc degeneration, lumbar region: Secondary | ICD-10-CM | POA: Diagnosis not present

## 2016-10-14 DIAGNOSIS — G894 Chronic pain syndrome: Secondary | ICD-10-CM | POA: Diagnosis not present

## 2016-10-14 DIAGNOSIS — F1120 Opioid dependence, uncomplicated: Secondary | ICD-10-CM | POA: Diagnosis not present

## 2016-10-14 DIAGNOSIS — Z720 Tobacco use: Secondary | ICD-10-CM | POA: Diagnosis not present

## 2016-10-14 DIAGNOSIS — Z79891 Long term (current) use of opiate analgesic: Secondary | ICD-10-CM | POA: Diagnosis not present

## 2016-11-09 DIAGNOSIS — F1120 Opioid dependence, uncomplicated: Secondary | ICD-10-CM | POA: Diagnosis not present

## 2016-11-09 DIAGNOSIS — M5416 Radiculopathy, lumbar region: Secondary | ICD-10-CM | POA: Diagnosis not present

## 2016-11-09 DIAGNOSIS — Z79891 Long term (current) use of opiate analgesic: Secondary | ICD-10-CM | POA: Diagnosis not present

## 2016-11-09 DIAGNOSIS — Z720 Tobacco use: Secondary | ICD-10-CM | POA: Diagnosis not present

## 2016-11-09 DIAGNOSIS — M5136 Other intervertebral disc degeneration, lumbar region: Secondary | ICD-10-CM | POA: Diagnosis not present

## 2016-11-09 DIAGNOSIS — G894 Chronic pain syndrome: Secondary | ICD-10-CM | POA: Diagnosis not present

## 2016-12-07 DIAGNOSIS — F1120 Opioid dependence, uncomplicated: Secondary | ICD-10-CM | POA: Diagnosis not present

## 2016-12-07 DIAGNOSIS — G894 Chronic pain syndrome: Secondary | ICD-10-CM | POA: Diagnosis not present

## 2016-12-07 DIAGNOSIS — M5416 Radiculopathy, lumbar region: Secondary | ICD-10-CM | POA: Diagnosis not present

## 2016-12-07 DIAGNOSIS — Z79891 Long term (current) use of opiate analgesic: Secondary | ICD-10-CM | POA: Diagnosis not present

## 2016-12-07 DIAGNOSIS — Z720 Tobacco use: Secondary | ICD-10-CM | POA: Diagnosis not present

## 2016-12-07 DIAGNOSIS — M5136 Other intervertebral disc degeneration, lumbar region: Secondary | ICD-10-CM | POA: Diagnosis not present

## 2017-01-04 DIAGNOSIS — M5416 Radiculopathy, lumbar region: Secondary | ICD-10-CM | POA: Diagnosis not present

## 2017-01-04 DIAGNOSIS — G894 Chronic pain syndrome: Secondary | ICD-10-CM | POA: Diagnosis not present

## 2017-01-04 DIAGNOSIS — F1120 Opioid dependence, uncomplicated: Secondary | ICD-10-CM | POA: Diagnosis not present

## 2017-01-04 DIAGNOSIS — Z79891 Long term (current) use of opiate analgesic: Secondary | ICD-10-CM | POA: Diagnosis not present

## 2017-01-04 DIAGNOSIS — M5136 Other intervertebral disc degeneration, lumbar region: Secondary | ICD-10-CM | POA: Diagnosis not present

## 2017-01-04 DIAGNOSIS — Z720 Tobacco use: Secondary | ICD-10-CM | POA: Diagnosis not present

## 2017-01-05 DIAGNOSIS — Z85820 Personal history of malignant melanoma of skin: Secondary | ICD-10-CM | POA: Diagnosis not present

## 2017-01-05 DIAGNOSIS — D485 Neoplasm of uncertain behavior of skin: Secondary | ICD-10-CM | POA: Diagnosis not present

## 2017-01-05 DIAGNOSIS — K130 Diseases of lips: Secondary | ICD-10-CM | POA: Diagnosis not present

## 2017-01-05 DIAGNOSIS — L918 Other hypertrophic disorders of the skin: Secondary | ICD-10-CM | POA: Diagnosis not present

## 2017-01-10 DIAGNOSIS — M5136 Other intervertebral disc degeneration, lumbar region: Secondary | ICD-10-CM | POA: Diagnosis not present

## 2017-01-10 DIAGNOSIS — M545 Low back pain: Secondary | ICD-10-CM | POA: Diagnosis not present

## 2017-01-10 DIAGNOSIS — M62561 Muscle wasting and atrophy, not elsewhere classified, right lower leg: Secondary | ICD-10-CM | POA: Diagnosis not present

## 2017-01-10 DIAGNOSIS — R2689 Other abnormalities of gait and mobility: Secondary | ICD-10-CM | POA: Diagnosis not present

## 2017-01-14 DIAGNOSIS — M545 Low back pain: Secondary | ICD-10-CM | POA: Diagnosis not present

## 2017-01-14 DIAGNOSIS — R2689 Other abnormalities of gait and mobility: Secondary | ICD-10-CM | POA: Diagnosis not present

## 2017-01-14 DIAGNOSIS — M62561 Muscle wasting and atrophy, not elsewhere classified, right lower leg: Secondary | ICD-10-CM | POA: Diagnosis not present

## 2017-01-14 DIAGNOSIS — M5136 Other intervertebral disc degeneration, lumbar region: Secondary | ICD-10-CM | POA: Diagnosis not present

## 2017-01-25 DIAGNOSIS — R2689 Other abnormalities of gait and mobility: Secondary | ICD-10-CM | POA: Diagnosis not present

## 2017-01-25 DIAGNOSIS — M545 Low back pain: Secondary | ICD-10-CM | POA: Diagnosis not present

## 2017-01-25 DIAGNOSIS — M5136 Other intervertebral disc degeneration, lumbar region: Secondary | ICD-10-CM | POA: Diagnosis not present

## 2017-01-25 DIAGNOSIS — M62561 Muscle wasting and atrophy, not elsewhere classified, right lower leg: Secondary | ICD-10-CM | POA: Diagnosis not present

## 2017-01-27 DIAGNOSIS — M62561 Muscle wasting and atrophy, not elsewhere classified, right lower leg: Secondary | ICD-10-CM | POA: Diagnosis not present

## 2017-01-27 DIAGNOSIS — R2689 Other abnormalities of gait and mobility: Secondary | ICD-10-CM | POA: Diagnosis not present

## 2017-01-27 DIAGNOSIS — M545 Low back pain: Secondary | ICD-10-CM | POA: Diagnosis not present

## 2017-01-27 DIAGNOSIS — M5136 Other intervertebral disc degeneration, lumbar region: Secondary | ICD-10-CM | POA: Diagnosis not present

## 2017-01-31 DIAGNOSIS — M62561 Muscle wasting and atrophy, not elsewhere classified, right lower leg: Secondary | ICD-10-CM | POA: Diagnosis not present

## 2017-01-31 DIAGNOSIS — R2689 Other abnormalities of gait and mobility: Secondary | ICD-10-CM | POA: Diagnosis not present

## 2017-01-31 DIAGNOSIS — M5136 Other intervertebral disc degeneration, lumbar region: Secondary | ICD-10-CM | POA: Diagnosis not present

## 2017-01-31 DIAGNOSIS — M545 Low back pain: Secondary | ICD-10-CM | POA: Diagnosis not present

## 2017-02-01 DIAGNOSIS — M5416 Radiculopathy, lumbar region: Secondary | ICD-10-CM | POA: Diagnosis not present

## 2017-02-01 DIAGNOSIS — M5136 Other intervertebral disc degeneration, lumbar region: Secondary | ICD-10-CM | POA: Diagnosis not present

## 2017-02-01 DIAGNOSIS — F1120 Opioid dependence, uncomplicated: Secondary | ICD-10-CM | POA: Diagnosis not present

## 2017-02-01 DIAGNOSIS — Z79891 Long term (current) use of opiate analgesic: Secondary | ICD-10-CM | POA: Diagnosis not present

## 2017-02-01 DIAGNOSIS — Z720 Tobacco use: Secondary | ICD-10-CM | POA: Diagnosis not present

## 2017-02-01 DIAGNOSIS — G894 Chronic pain syndrome: Secondary | ICD-10-CM | POA: Diagnosis not present

## 2017-06-10 ENCOUNTER — Encounter: Payer: Self-pay | Admitting: Gastroenterology

## 2017-07-15 ENCOUNTER — Ambulatory Visit (INDEPENDENT_AMBULATORY_CARE_PROVIDER_SITE_OTHER): Payer: Medicare PPO | Admitting: Gastroenterology

## 2017-07-15 ENCOUNTER — Other Ambulatory Visit (INDEPENDENT_AMBULATORY_CARE_PROVIDER_SITE_OTHER): Payer: Medicare PPO

## 2017-07-15 ENCOUNTER — Encounter: Payer: Self-pay | Admitting: Gastroenterology

## 2017-07-15 VITALS — BP 122/82 | HR 66 | Ht 70.0 in | Wt 173.5 lb

## 2017-07-15 DIAGNOSIS — Z8719 Personal history of other diseases of the digestive system: Secondary | ICD-10-CM

## 2017-07-15 DIAGNOSIS — K5900 Constipation, unspecified: Secondary | ICD-10-CM

## 2017-07-15 DIAGNOSIS — Z86010 Personal history of colonic polyps: Secondary | ICD-10-CM

## 2017-07-15 LAB — COMPREHENSIVE METABOLIC PANEL
ALBUMIN: 4.1 g/dL (ref 3.5–5.2)
ALK PHOS: 108 U/L (ref 39–117)
ALT: 14 U/L (ref 0–53)
AST: 7 U/L (ref 0–37)
BUN: 14 mg/dL (ref 6–23)
CALCIUM: 8.7 mg/dL (ref 8.4–10.5)
CHLORIDE: 102 meq/L (ref 96–112)
CO2: 32 mEq/L (ref 19–32)
Creatinine, Ser: 0.87 mg/dL (ref 0.40–1.50)
GFR: 95.22 mL/min (ref >60.00)
Glucose, Bld: 89 mg/dL (ref 70–99)
POTASSIUM: 4.7 meq/L (ref 3.5–5.1)
Sodium: 140 mEq/L (ref 135–145)
TOTAL PROTEIN: 6.5 g/dL (ref 6.0–8.3)
Total Bilirubin: 0.4 mg/dL (ref 0.2–1.2)

## 2017-07-15 LAB — CBC WITH DIFFERENTIAL/PLATELET
Basophils Absolute: 0.1 10*3/uL (ref 0.0–0.1)
Basophils Relative: 1.3 % (ref 0.0–3.0)
EOS ABS: 0.3 10*3/uL (ref 0.0–0.7)
EOS PCT: 4 % (ref 0.0–5.0)
HCT: 41.7 % (ref 39.0–52.0)
HEMOGLOBIN: 14.3 g/dL (ref 13.0–17.0)
LYMPHS ABS: 3 10*3/uL (ref 0.7–4.0)
LYMPHS PCT: 44.7 % (ref 12.0–46.0)
MCHC: 34.4 g/dL (ref 30.0–36.0)
MCV: 91.1 fl (ref 78.0–100.0)
MONO ABS: 0.5 10*3/uL (ref 0.1–1.0)
Monocytes Relative: 7.4 % (ref 3.0–12.0)
NEUTROS PCT: 42.6 % — AB (ref 43.0–77.0)
Neutro Abs: 2.8 10*3/uL (ref 1.4–7.7)
Platelets: 199 10*3/uL (ref 150.0–400.0)
RBC: 4.57 Mil/uL (ref 4.22–5.81)
RDW: 13 % (ref 11.5–15.5)
WBC: 6.6 10*3/uL (ref 4.0–10.5)

## 2017-07-15 MED ORDER — SOD PICOSULFATE-MAG OX-CIT ACD 10-3.5-12 MG-GM -GM/160ML PO SOLN
1.0000 | Freq: Once | ORAL | 0 refills | Status: AC
Start: 2017-07-15 — End: 2017-07-15

## 2017-07-15 NOTE — Patient Instructions (Signed)
If you are age 60 or older, your body mass index should be between 23-30. Your Body mass index is 24.89 kg/m. If this is out of the aforementioned range listed, please consider follow up with your Primary Care Provider.  If you are age 76 or younger, your body mass index should be between 19-25. Your Body mass index is 24.89 kg/m. If this is out of the aformentioned range listed, please consider follow up with your Primary Care Provider.   You have been scheduled for a colonoscopy. Please follow written instructions given to you at your visit today.  Please pick up your prep supplies at the pharmacy within the next 1-3 days. If you use inhalers (even only as needed), please bring them with you on the day of your procedure. Your physician has requested that you go to www.startemmi.com and enter the access code given to you at your visit today. This web site gives a general overview about your procedure. However, you should still follow specific instructions given to you by our office regarding your preparation for the procedure.  We have sent the following medications to your pharmacy for you to pick up at your convenience: Clenpiq  Two days before your procedure: Mix 3 packs (or capfuls) of Miralax in 48 ounces of clear liquid and drink at 6pm.  Please stop at the lab before you leave the office today.   Thank you,  Dr. Jackquline Denmark

## 2017-07-15 NOTE — Progress Notes (Signed)
Chief Complaint: constipation  Referring Provider:  Rochel Brome, MD      ASSESSMENT AND PLAN;   #1. H/O advanced polyps - colon 05/2014-colonic polyps (tubular adenomas), mild sigmoid diverticulosis, highly redundant colon. #2. H/O Melena (intermittently) - CBC, CMP today. - EGD at the time of colonoscopy. - Proceed with colonoscopy for further evaluation.  He is due for repeat colonoscopy anyway. He will be given a 2-day preparation. - Continue nexium 40mg  po qd. - Stop NSAIds including goody powders. #3. Chronic constipation, pt on Xtampza (oxycodone). - Continue miralax 17g TIW. - Minimize pain medications - No previous office visits available (await de-conversion)   HPI:    NIHAR KLUS is a 62 y.o. male  With chronic constipation, better with MiraLAX 3 times a week. Had questionable dark stools Has been on Nexium but also has been taking Goody powders occasionally. No dizziness or any significant abdominal pain Dark stools have resolved completely -he only had 2-3 episodes over the last 6 months. Had colonoscopy with colonic polyps and is due for repeat colonoscopy. Has been on chronic pain medications No weight loss No nausea vomiting heartburn (as long as he takes Nexium).   Past Medical History:  Diagnosis Date  . Arthritis   . Arthritis    spinal arthritis  . Cancer (HCC)    melanoma, basal cell  . Centrilobular emphysema ([place]) 07/08/2015  . Emphysema of lung (HCC)    COPD  . Hypercholesterolemia   . Hypertension   . Kyphosis    mild  . Migraine   . Neuropathy   . Scoliosis   . Sleep apnea   . Tinnitus     Past Surgical History:  Procedure Laterality Date  . COLONOSCOPY  2016   Colon polyps. Tubular Adenoma.  . TONSILLECTOMY      Family History  Problem Relation Age of Onset  . Heart disease Father   . Stroke Father   . Breast cancer Maternal Grandmother   . Leukemia Mother   . Skin cancer Mother   . Diabetes Mother   . Stroke Mother    . Hypertension Mother   . Skin cancer Brother   . Colon cancer Neg Hx     Social History   Tobacco Use  . Smoking status: Current Every Day Smoker    Packs/day: 0.75    Years: 40.00    Pack years: 30.00    Types: Cigarettes  . Smokeless tobacco: Never Used  . Tobacco comment: 08/20/16 10 cigs daily  Substance Use Topics  . Alcohol use: No    Alcohol/week: 0.0 oz  . Drug use: No    Current Outpatient Medications  Medication Sig Dispense Refill  . albuterol (PROVENTIL HFA;VENTOLIN HFA) 108 (90 Base) MCG/ACT inhaler Inhale 2 puffs into the lungs every 6 (six) hours as needed for wheezing or shortness of breath.    . Aspirin-Acetaminophen-Caffeine (GOODYS EXTRA STRENGTH) 500-325-65 MG PACK Take 325 mg by mouth 2 (two) times daily as needed.    Marland Kitchen atorvastatin (LIPITOR) 40 MG tablet Take 40 mg by mouth daily.    Marland Kitchen esomeprazole (NEXIUM) 40 MG capsule Take 40 mg by mouth daily at 12 noon.    . loratadine (CLARITIN) 10 MG tablet Take 10 mg by mouth daily.    . propranolol (INDERAL) 60 MG tablet Take 60 mg by mouth 2 (two) times daily.    . tamsulosin (FLOMAX) 0.4 MG CAPS capsule Take 0.4 mg by mouth daily after breakfast.    .  XTAMPZA ER 27 MG C12A 27 mg 2 (two) times daily.     No current facility-administered medications for this visit.     Allergies  Allergen Reactions  . Codeine Nausea Only  . Penicillins     As child    Review of Systems:  Constitutional: Denies fever, chills, diaphoresis, appetite change and fatigue.  HEENT: Denies photophobia, eye pain, redness, hearing loss, ear pain, congestion, sore throat, rhinorrhea, sneezing, mouth sores, neck pain, neck stiffness and tinnitus.   Respiratory: Has COPD with occasional shortness of breath. Cardiovascular: Denies chest pain, palpitations and leg swelling.  Genitourinary: Denies dysuria, urgency, frequency, hematuria, flank pain and difficulty urinating.  Musculoskeletal: Has myalgias, back pain, joint swelling,  arthralgias and gait problem.  Skin: No rash. Has chronic back pain. Neurological: Denies dizziness, seizures, syncope, weakness, light-headedness, numbness and headaches.  Hematological: Denies adenopathy. Easy bruising, personal or family bleeding history  Psychiatric/Behavioral: No anxiety or depression     Physical Exam:    BP 122/82   Pulse 66   Ht 5\' 10"  (1.778 m)   Wt 173 lb 8 oz (78.7 kg)   BMI 24.89 kg/m  Filed Weights   07/15/17 1008  Weight: 173 lb 8 oz (78.7 kg)   Constitutional:  Well-developed, in no acute distress. Psychiatric: Normal mood and affect. Behavior is normal. HEENT: Pupils normal.  Conjunctivae are normal. No scleral icterus. Neck supple.  Cardiovascular: Bilateral decreased breath sounds. Pulmonary/chest: Effort normal and breath sounds normal. No wheezing, rales or rhonchi. Abdominal: Soft, nondistended. Nontender. Bowel sounds active throughout. There are no masses palpable. No hepatomegaly. Rectal:  defered Neurological: Alert and oriented to person place and time. Skin: Skin is warm and dry. No rashes noted.  No flowsheet data found.  CMP Latest Ref Rng & Units 08/04/2016  Creatinine 0.61 - 1.24 mg/dL 1.00      Carmell Austria, MD 07/15/2017, 10:23 AM  Cc: Rochel Brome, MD

## 2017-07-19 ENCOUNTER — Telehealth: Payer: Self-pay

## 2017-07-19 NOTE — Telephone Encounter (Signed)
I called to give him the lab results and spoke with his wife.  She relates that she talked with their insurance company and at this time they cannot afford the [place].  She will call to reschedule when they have the funds for the procedure.

## 2017-07-25 NOTE — Telephone Encounter (Signed)
Please inform the PCP

## 2017-07-26 NOTE — Telephone Encounter (Signed)
I sent a message to his PCP.

## 2017-08-10 ENCOUNTER — Encounter: Payer: Medicare PPO | Admitting: Gastroenterology

## 2017-09-06 ENCOUNTER — Encounter: Payer: Self-pay | Admitting: Podiatry

## 2017-09-06 ENCOUNTER — Ambulatory Visit: Payer: Medicare PPO | Admitting: Podiatry

## 2017-09-06 DIAGNOSIS — L600 Ingrowing nail: Secondary | ICD-10-CM

## 2017-09-06 NOTE — Progress Notes (Signed)
Subjective:  Patient ID: Jacob Graves, male    DOB: Feb 13, 1956,  MRN: [id number]  Chief Complaint  Patient presents with  . Ingrown Toenail    possible ingrown toe nail on right great toe medial side    62 y.o. male presents with and ingrown toenail to the right toe. History of trauma from dropping a marble slab on the toe. Reports pain in the toe. No drainage. Past Medical History:  Diagnosis Date  . Arthritis   . Arthritis    spinal arthritis  . Cancer (HCC)    melanoma, basal cell  . Centrilobular emphysema ([place]) 07/08/2015  . Emphysema of lung (HCC)    COPD  . Hypercholesterolemia   . Hypertension   . Kyphosis    mild  . Migraine   . Neuropathy   . Scoliosis   . Sleep apnea   . Tinnitus    Past Surgical History:  Procedure Laterality Date  . COLONOSCOPY  2016   Colon polyps. Tubular Adenoma.  . TONSILLECTOMY      Current Outpatient Medications:  .  albuterol (PROVENTIL HFA;VENTOLIN HFA) 108 (90 Base) MCG/ACT inhaler, Inhale 2 puffs into the lungs every 6 (six) hours as needed for wheezing or shortness of breath., Disp: , Rfl:  .  Aspirin-Acetaminophen-Caffeine (GOODYS EXTRA STRENGTH) 500-325-65 MG PACK, Take 325 mg by mouth 2 (two) times daily as needed., Disp: , Rfl:  .  atorvastatin (LIPITOR) 40 MG tablet, Take 40 mg by mouth daily., Disp: , Rfl:  .  esomeprazole (NEXIUM) 40 MG capsule, Take 40 mg by mouth daily at 12 noon., Disp: , Rfl:  .  loratadine (CLARITIN) 10 MG tablet, Take 10 mg by mouth daily., Disp: , Rfl:  .  oxyCODONE (ROXICODONE) 15 MG immediate release tablet, , Disp: , Rfl:  .  propranolol (INDERAL) 60 MG tablet, Take 60 mg by mouth 2 (two) times daily., Disp: , Rfl:  .  tamsulosin (FLOMAX) 0.4 MG CAPS capsule, Take 0.4 mg by mouth daily after breakfast., Disp: , Rfl:  .  XTAMPZA ER 27 MG C12A, 27 mg 2 (two) times daily., Disp: , Rfl:   Allergies  Allergen Reactions  . Codeine Nausea Only and Nausea And Vomiting  . Penicillins Itching and Other  (See Comments)    As child    Review of Systems Objective:   Vitals:   09/06/17 1459  BP: 128/84  Pulse: 75   General AA&O x3. Normal mood and affect.  Vascular Dorsalis pedis and posterior tibial pulses  present 2+ bilaterally  Capillary refill normal to all digits. Pedal hair growth normal.  Neurologic Epicritic sensation grossly present.  Dermatologic No open lesions. Interspaces clear of maceration. Nails well groomed and normal in appearance. Painful ingrowing nail at medial nail borders of the hallux nail right.  Orthopedic: MMT 5/5 in dorsiflexion, plantarflexion, inversion, and eversion. Normal joint ROM without pain or crepitus. Pain to palpation about the ingrown nail.   Assessment & Plan:  Patient was evaluated and treated and all questions answered.  Ingrown Nail, right -Patient elects to proceed with ingrown toenail removal today -Ingrown nail excised. See procedure note. -Educated on post-procedure care including soaking. Written instructions provided. -Patient to follow up in 2 weeks for nail check.  Procedure: Excision of Ingrown Toenail Location: Right 1st toe medial nail borders. Anesthesia: Lidocaine 1% plain; 1.5 mL and Marcaine 0.5% plain; 1.5 mL, digital block. Skin Prep: Betadine. Dressing: Silvadene; telfa; dry, sterile, compression dressing. Technique: Following skin prep, the  toe was exsanguinated and a tourniquet was secured at the base of the toe. The affected nail border was freed, split with a nail splitter, and excised. Chemical matrixectomy was then performed with phenol and irrigated out with alcohol. The tourniquet was then removed and sterile dressing applied. Disposition: Patient tolerated procedure well. Patient to return in 2 weeks for follow-up.   Return in about 2 weeks (around 09/20/2017) for Nail Check.

## 2017-09-06 NOTE — Patient Instructions (Signed)
Soak Instructions    THE DAY AFTER THE PROCEDURE  Place 1/4 cup of epsom salts in a quart of warm tap water.  Submerge your foot or feet with outer bandage intact for the initial soak; this will allow the bandage to become moist and wet for easy lift off.  Once you remove your bandage, continue to soak in the solution for 20 minutes.  This soak should be done twice a day.  Next, remove your foot or feet from solution, blot dry the affected area and cover.  You may use a band aid large enough to cover the area or use gauze and tape.  Apply other medications to the area as directed by the doctor such as polysporin neosporin.  IF YOUR SKIN BECOMES IRRITATED WHILE USING THESE INSTRUCTIONS, IT IS OKAY TO SWITCH TO  [place] VINEGAR AND WATER. Or you may use antibacterial soap and water to keep the toe clean  Monitor for any signs/symptoms of infection. Call the office immediately if any occur or go directly to the emergency room. Call with any questions/concerns.    Long Term Care Instructions-Post Nail Surgery  You have had your ingrown toenail and root treated with a chemical.  This chemical causes a burn that will drain and ooze like a blister.  This can drain for 6-8 weeks or longer.  It is important to keep this area clean, covered, and follow the soaking instructions dispensed at the time of your surgery.  This area will eventually dry and form a scab.  Once the scab forms you no longer need to soak or apply a dressing.  If at any time you experience an increase in pain, redness, swelling, or drainage, you should contact the office as soon as possible.  

## 2017-09-09 ENCOUNTER — Telehealth: Payer: Self-pay | Admitting: Podiatry

## 2017-09-09 NOTE — Telephone Encounter (Signed)
Dr. March Rummage states okay, must keep dressed and perform after care immediately after mowing.

## 2017-09-09 NOTE — Telephone Encounter (Signed)
Pt was wondering if he would be able to cut the grass.

## 2017-09-16 ENCOUNTER — Ambulatory Visit: Payer: Medicare PPO | Admitting: Sports Medicine

## 2017-09-19 ENCOUNTER — Ambulatory Visit: Payer: Medicare PPO | Admitting: Podiatry

## 2017-09-20 ENCOUNTER — Ambulatory Visit: Payer: Medicare PPO | Admitting: Podiatry

## 2018-03-24 DIAGNOSIS — I10 Essential (primary) hypertension: Secondary | ICD-10-CM | POA: Diagnosis not present

## 2018-03-24 DIAGNOSIS — E782 Mixed hyperlipidemia: Secondary | ICD-10-CM | POA: Diagnosis not present

## 2018-03-24 DIAGNOSIS — J410 Simple chronic bronchitis: Secondary | ICD-10-CM | POA: Diagnosis not present

## 2018-03-24 DIAGNOSIS — G894 Chronic pain syndrome: Secondary | ICD-10-CM | POA: Diagnosis not present

## 2018-03-24 DIAGNOSIS — R7301 Impaired fasting glucose: Secondary | ICD-10-CM | POA: Diagnosis not present

## 2018-03-28 DIAGNOSIS — G894 Chronic pain syndrome: Secondary | ICD-10-CM | POA: Diagnosis not present

## 2018-03-28 DIAGNOSIS — M47816 Spondylosis without myelopathy or radiculopathy, lumbar region: Secondary | ICD-10-CM | POA: Diagnosis not present

## 2018-03-28 DIAGNOSIS — M4316 Spondylolisthesis, lumbar region: Secondary | ICD-10-CM | POA: Diagnosis not present

## 2018-03-28 DIAGNOSIS — M5136 Other intervertebral disc degeneration, lumbar region: Secondary | ICD-10-CM | POA: Diagnosis not present

## 2018-04-04 DIAGNOSIS — M542 Cervicalgia: Secondary | ICD-10-CM | POA: Diagnosis not present

## 2018-04-04 DIAGNOSIS — R51 Headache: Secondary | ICD-10-CM | POA: Diagnosis not present

## 2018-04-24 DIAGNOSIS — M47816 Spondylosis without myelopathy or radiculopathy, lumbar region: Secondary | ICD-10-CM | POA: Diagnosis not present

## 2018-04-24 DIAGNOSIS — M4316 Spondylolisthesis, lumbar region: Secondary | ICD-10-CM | POA: Diagnosis not present

## 2018-04-24 DIAGNOSIS — M5136 Other intervertebral disc degeneration, lumbar region: Secondary | ICD-10-CM | POA: Diagnosis not present

## 2018-04-25 DIAGNOSIS — M47816 Spondylosis without myelopathy or radiculopathy, lumbar region: Secondary | ICD-10-CM | POA: Diagnosis not present

## 2018-04-25 DIAGNOSIS — M419 Scoliosis, unspecified: Secondary | ICD-10-CM | POA: Diagnosis not present

## 2018-04-25 DIAGNOSIS — M545 Low back pain: Secondary | ICD-10-CM | POA: Diagnosis not present

## 2018-04-25 DIAGNOSIS — M4316 Spondylolisthesis, lumbar region: Secondary | ICD-10-CM | POA: Diagnosis not present

## 2018-05-04 DIAGNOSIS — R6889 Other general symptoms and signs: Secondary | ICD-10-CM | POA: Diagnosis not present

## 2018-05-18 DIAGNOSIS — G894 Chronic pain syndrome: Secondary | ICD-10-CM | POA: Diagnosis not present

## 2018-05-18 DIAGNOSIS — Z79891 Long term (current) use of opiate analgesic: Secondary | ICD-10-CM | POA: Diagnosis not present

## 2018-05-18 DIAGNOSIS — M419 Scoliosis, unspecified: Secondary | ICD-10-CM | POA: Diagnosis not present

## 2018-05-18 DIAGNOSIS — M47816 Spondylosis without myelopathy or radiculopathy, lumbar region: Secondary | ICD-10-CM | POA: Diagnosis not present

## 2018-05-18 DIAGNOSIS — M4316 Spondylolisthesis, lumbar region: Secondary | ICD-10-CM | POA: Diagnosis not present

## 2018-06-01 DIAGNOSIS — M47896 Other spondylosis, lumbar region: Secondary | ICD-10-CM | POA: Diagnosis not present

## 2018-06-01 DIAGNOSIS — M419 Scoliosis, unspecified: Secondary | ICD-10-CM | POA: Diagnosis not present

## 2018-06-01 DIAGNOSIS — M5136 Other intervertebral disc degeneration, lumbar region: Secondary | ICD-10-CM | POA: Diagnosis not present

## 2018-06-13 DIAGNOSIS — M545 Low back pain: Secondary | ICD-10-CM | POA: Diagnosis not present

## 2018-06-13 DIAGNOSIS — M48061 Spinal stenosis, lumbar region without neurogenic claudication: Secondary | ICD-10-CM | POA: Diagnosis not present

## 2018-06-15 DIAGNOSIS — M4316 Spondylolisthesis, lumbar region: Secondary | ICD-10-CM | POA: Diagnosis not present

## 2018-06-15 DIAGNOSIS — M9983 Other biomechanical lesions of lumbar region: Secondary | ICD-10-CM | POA: Diagnosis not present

## 2018-06-15 DIAGNOSIS — G894 Chronic pain syndrome: Secondary | ICD-10-CM | POA: Diagnosis not present

## 2018-06-29 DIAGNOSIS — M5136 Other intervertebral disc degeneration, lumbar region: Secondary | ICD-10-CM | POA: Diagnosis not present

## 2018-06-29 DIAGNOSIS — M542 Cervicalgia: Secondary | ICD-10-CM | POA: Diagnosis not present

## 2018-06-29 DIAGNOSIS — M419 Scoliosis, unspecified: Secondary | ICD-10-CM | POA: Diagnosis not present

## 2018-06-29 DIAGNOSIS — I10 Essential (primary) hypertension: Secondary | ICD-10-CM | POA: Diagnosis not present

## 2018-07-04 DIAGNOSIS — M5416 Radiculopathy, lumbar region: Secondary | ICD-10-CM | POA: Diagnosis not present

## 2018-07-11 DIAGNOSIS — M9983 Other biomechanical lesions of lumbar region: Secondary | ICD-10-CM | POA: Diagnosis not present

## 2018-07-11 DIAGNOSIS — M419 Scoliosis, unspecified: Secondary | ICD-10-CM | POA: Diagnosis not present

## 2018-07-11 DIAGNOSIS — M48061 Spinal stenosis, lumbar region without neurogenic claudication: Secondary | ICD-10-CM | POA: Diagnosis not present

## 2018-07-11 DIAGNOSIS — M4316 Spondylolisthesis, lumbar region: Secondary | ICD-10-CM | POA: Diagnosis not present

## 2018-07-18 DIAGNOSIS — M5136 Other intervertebral disc degeneration, lumbar region: Secondary | ICD-10-CM | POA: Diagnosis not present

## 2018-07-18 DIAGNOSIS — M5030 Other cervical disc degeneration, unspecified cervical region: Secondary | ICD-10-CM | POA: Diagnosis not present

## 2018-07-18 DIAGNOSIS — M542 Cervicalgia: Secondary | ICD-10-CM | POA: Diagnosis not present

## 2018-07-25 DIAGNOSIS — M542 Cervicalgia: Secondary | ICD-10-CM | POA: Diagnosis not present

## 2018-07-27 DIAGNOSIS — M5030 Other cervical disc degeneration, unspecified cervical region: Secondary | ICD-10-CM | POA: Diagnosis not present

## 2018-07-27 DIAGNOSIS — M47892 Other spondylosis, cervical region: Secondary | ICD-10-CM | POA: Diagnosis not present

## 2018-07-27 DIAGNOSIS — M5136 Other intervertebral disc degeneration, lumbar region: Secondary | ICD-10-CM | POA: Diagnosis not present

## 2018-07-27 DIAGNOSIS — M419 Scoliosis, unspecified: Secondary | ICD-10-CM | POA: Diagnosis not present

## 2018-07-31 DIAGNOSIS — M542 Cervicalgia: Secondary | ICD-10-CM | POA: Diagnosis not present

## 2018-08-08 DIAGNOSIS — M47816 Spondylosis without myelopathy or radiculopathy, lumbar region: Secondary | ICD-10-CM | POA: Diagnosis not present

## 2018-08-08 DIAGNOSIS — M48061 Spinal stenosis, lumbar region without neurogenic claudication: Secondary | ICD-10-CM | POA: Diagnosis not present

## 2018-08-08 DIAGNOSIS — G894 Chronic pain syndrome: Secondary | ICD-10-CM | POA: Diagnosis not present

## 2018-08-08 DIAGNOSIS — M419 Scoliosis, unspecified: Secondary | ICD-10-CM | POA: Diagnosis not present

## 2018-08-15 DIAGNOSIS — M419 Scoliosis, unspecified: Secondary | ICD-10-CM | POA: Diagnosis not present

## 2018-08-15 DIAGNOSIS — M5136 Other intervertebral disc degeneration, lumbar region: Secondary | ICD-10-CM | POA: Diagnosis not present

## 2018-08-15 DIAGNOSIS — M47892 Other spondylosis, cervical region: Secondary | ICD-10-CM | POA: Diagnosis not present

## 2018-08-30 IMAGING — MR MR HEAD WO/W CM
10 of 13 series · 24 of 48 positions shown · IV contrast (multihance)
Comparison: None.

CLINICAL DATA: Ringing in ears for 10 years. Recent increase in
severity in the LEFT ear.

EXAM:
MRI HEAD WITHOUT AND WITH CONTRAST
TECHNIQUE: Multiplanar, multiecho pulse sequences of the brain and surrounding
structures were obtained without and with intravenous contrast.
CONTRAST:  15mL MULTIHANCE GADOBENATE DIMEGLUMINE 529 MG/ML IV SOLN

[Series 3: T1 · sagittal · 5.0mm · 0.47mm/px · 4 of 25 slices shown (1 of 3)]
[im 1/25]
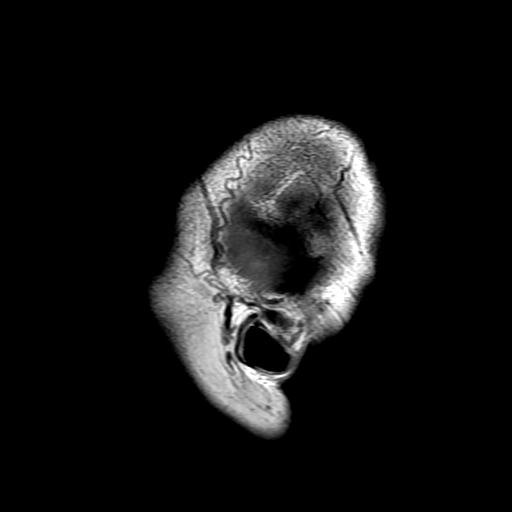
[im 9/25]
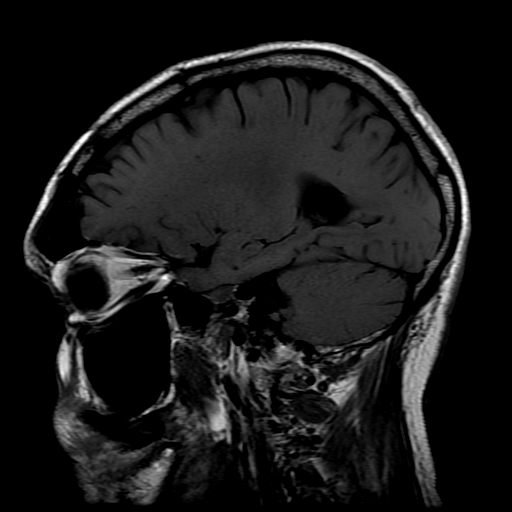
[im 17/25]
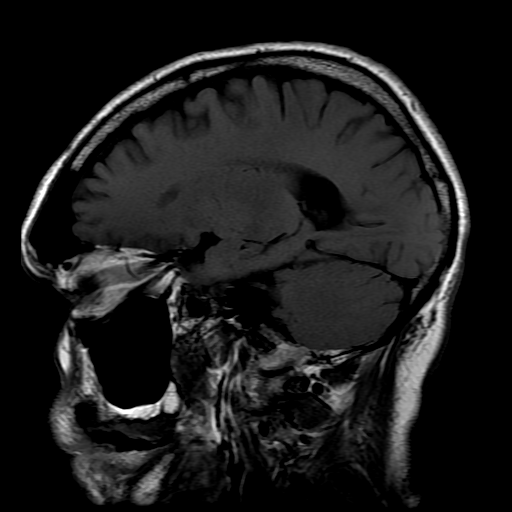
[im 25/25]
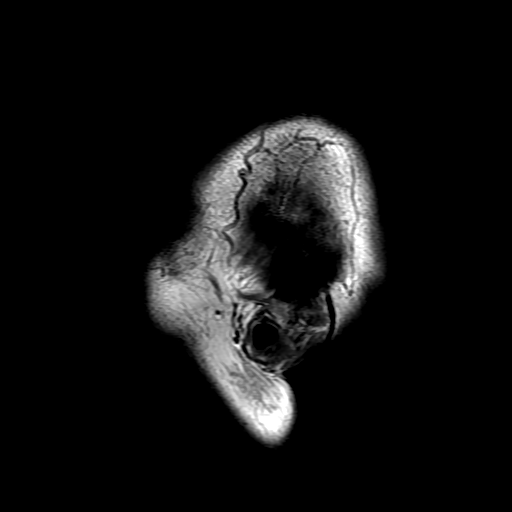

[Series 4: DWI · axial · 5.0mm · 1.09mm/px · z∈[-77,+71]mm · 7 of 62 slices shown]
[im 1/62]
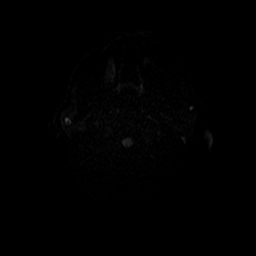
[im 11/62]
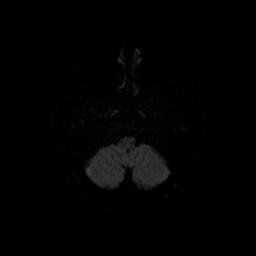
[im 21/62]
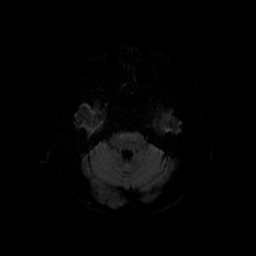
[im 31/62]
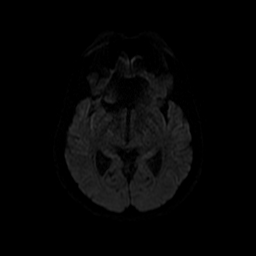
[im 41/62]
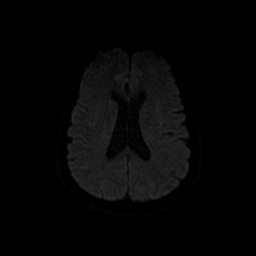
[im 51/62]
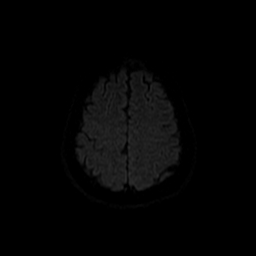
[im 62/62]
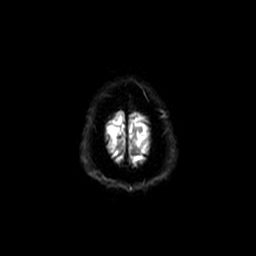

[Series 5: T2 · axial · 5.0mm · 0.47mm/px · z∈[-82,+77]mm · 2 of 24 slices shown (1 of 2)]
[im 1/24]
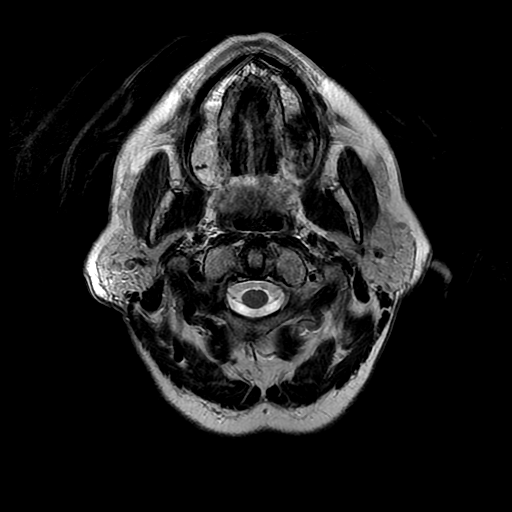
[im 24/24]
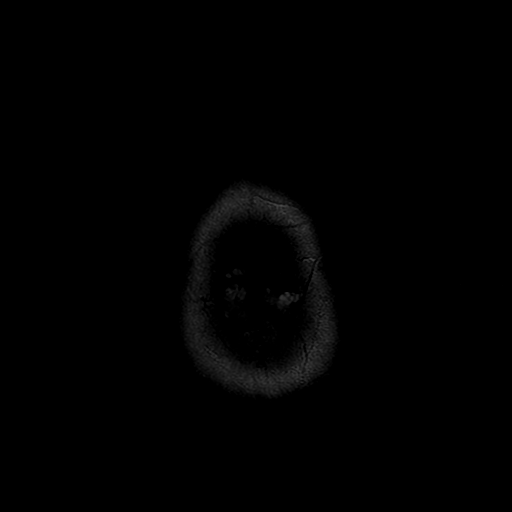

[Series 6: FLAIR · axial · 5.0mm · 0.47mm/px · z∈[-82,+77]mm · 2 of 24 slices shown]
[im 1/24]
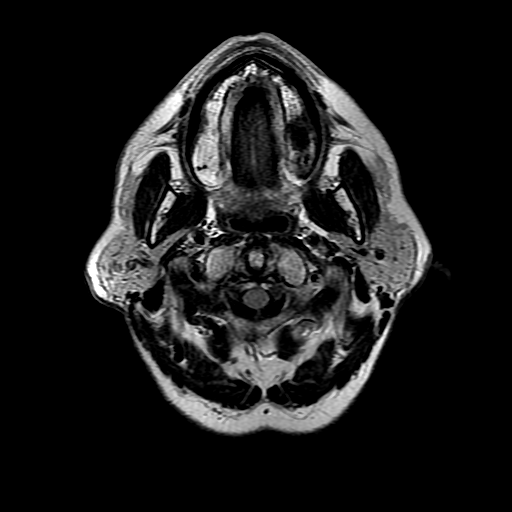
[im 24/24]
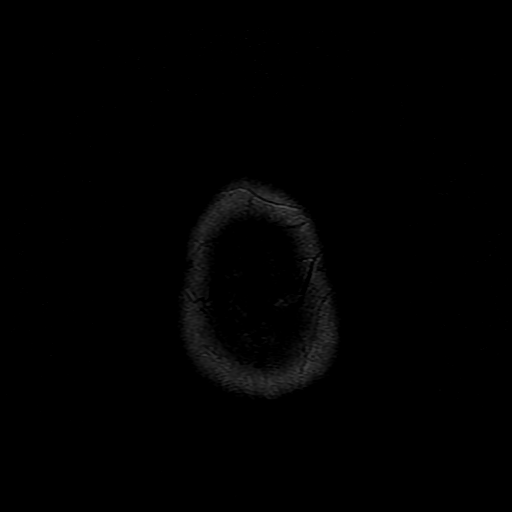

[Series 7: T2 · coronal · 5.0mm · 0.43mm/px · 3 of 28 slices shown (2 of 2)]
[im 1/28]
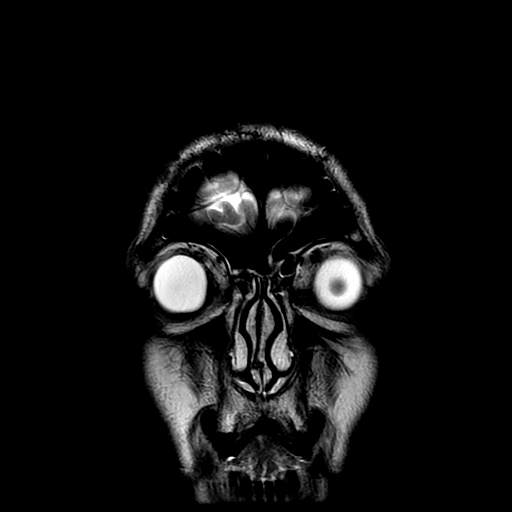
[im 14/28]
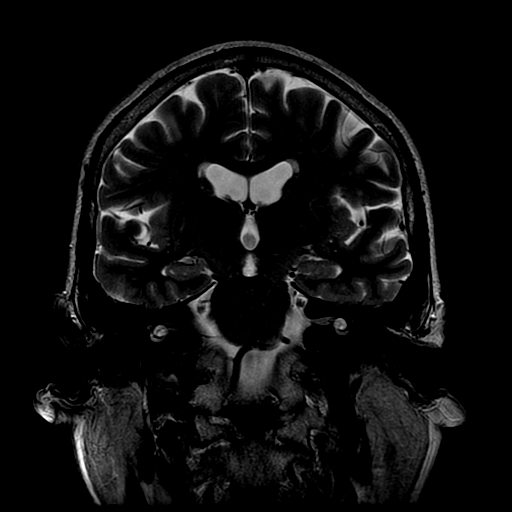
[im 28/28]
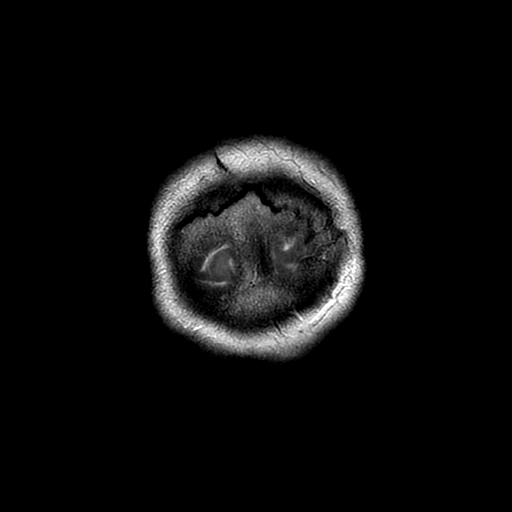

[Series 10: ax mpgr · axial · 5.0mm · 0.47mm/px · z∈[-81,+78]mm · 2 of 24 slices shown]
[im 1/24]
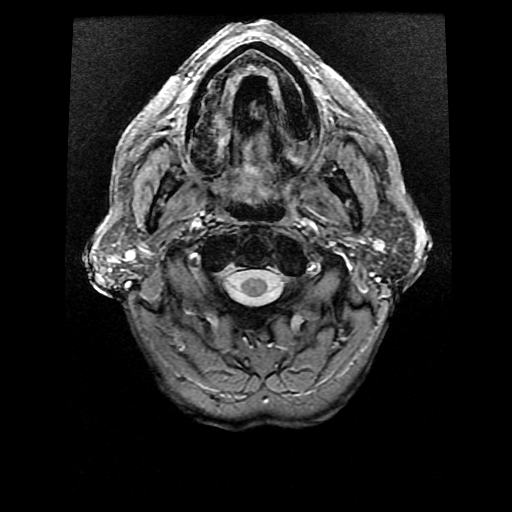
[im 24/24]
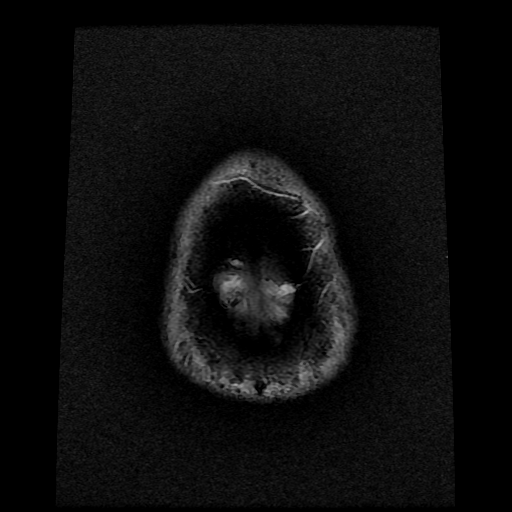

[Series 12: T1 · axial · 3.0mm · 0.35mm/px · 1 of 12 slices shown (2 of 3)]
[im 1/12]
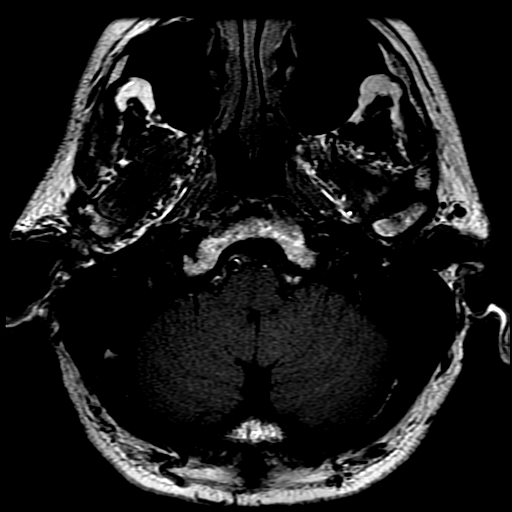

[Series 13: T1 · coronal · 3.0mm · 0.35mm/px · 1 of 13 slices shown (3 of 3)]
[im 1/13]
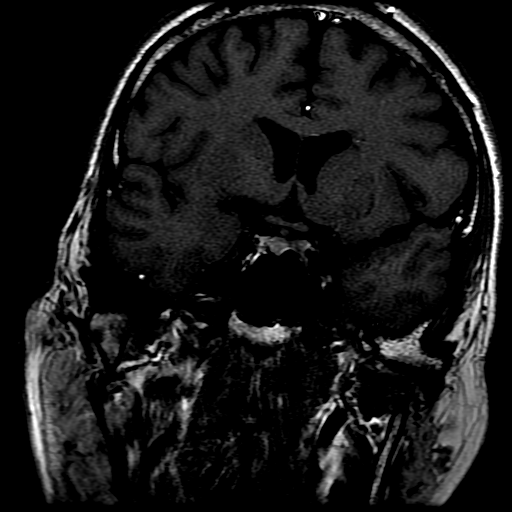

[Series 15: T1 post-contrast · coronal · 3.0mm · 0.35mm/px · 1 of 13 slices shown (1 of 2)]
[im 1/13]
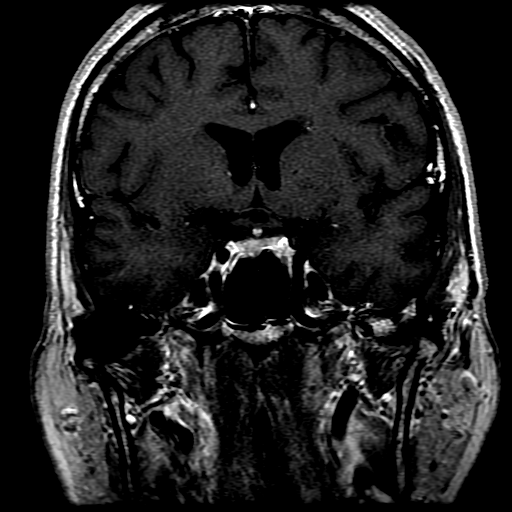

[Series 16: T1 post-contrast · axial · 3.0mm · 0.35mm/px · 1 of 12 slices shown (2 of 2)]
[im 1/12]
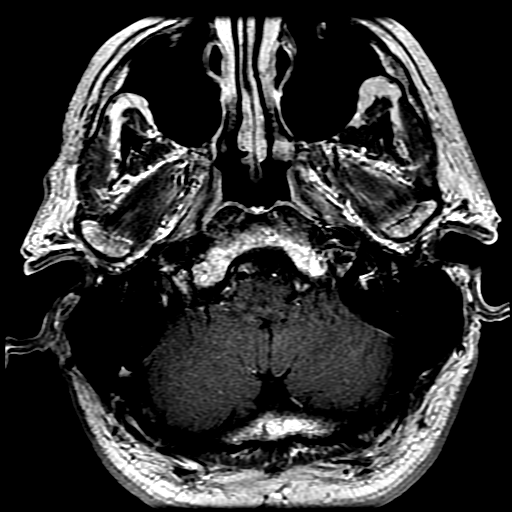

[24 of 48 positions shown; findings below may reference images not displayed]

FINDINGS: Brain: No evidence for acute infarction, hemorrhage, mass lesion,
hydrocephalus, or extra-axial fluid. Normal for age cerebral volume.
Mild subcortical and periventricular T2 and FLAIR hyperintensities,
likely chronic microvascular ischemic change.

Thin-section imaging through the posterior fossa was performed pre
and postcontrast. No vestibular schwannoma or posterior fossa mass.
No temporal bone inflammatory process or effusion.

A prominent loop of the LEFT anterior inferior cerebellar artery
projects into the IAC. There appears to be some displacement of the
cochlear nerve posteriorly. Significance uncertain.

Post infusion, no abnormal enhancement of the brain or meninges. No
labyrinthine enhancement is seen.

Vascular: Normal flow voids.

Skull and upper cervical spine: Normal marrow signal.

Sinuses/Orbits: Negative.

Other: None.
IMPRESSION: A prominent loop of the LEFT anterior inferior cerebellar artery
projects into the LEFT IAC. There appears to be some displacement of
the cochlear nerve, but it is unclear if this contributes to
tinnitus. Correlate clinically.

Normal for age cerebral volume with mild chronic microvascular
ischemic change. No abnormal enhancement of the brain or meninges.

## 2018-09-04 DIAGNOSIS — M47816 Spondylosis without myelopathy or radiculopathy, lumbar region: Secondary | ICD-10-CM | POA: Diagnosis not present

## 2018-09-05 DIAGNOSIS — Z1389 Encounter for screening for other disorder: Secondary | ICD-10-CM | POA: Diagnosis not present

## 2018-09-05 DIAGNOSIS — M5136 Other intervertebral disc degeneration, lumbar region: Secondary | ICD-10-CM | POA: Diagnosis not present

## 2018-09-05 DIAGNOSIS — G894 Chronic pain syndrome: Secondary | ICD-10-CM | POA: Diagnosis not present

## 2018-09-05 DIAGNOSIS — M47816 Spondylosis without myelopathy or radiculopathy, lumbar region: Secondary | ICD-10-CM | POA: Diagnosis not present

## 2018-09-05 DIAGNOSIS — M4316 Spondylolisthesis, lumbar region: Secondary | ICD-10-CM | POA: Diagnosis not present

## 2018-09-05 DIAGNOSIS — M48061 Spinal stenosis, lumbar region without neurogenic claudication: Secondary | ICD-10-CM | POA: Diagnosis not present

## 2018-09-05 DIAGNOSIS — M419 Scoliosis, unspecified: Secondary | ICD-10-CM | POA: Diagnosis not present

## 2018-09-11 DIAGNOSIS — E782 Mixed hyperlipidemia: Secondary | ICD-10-CM | POA: Diagnosis not present

## 2018-09-11 DIAGNOSIS — R7301 Impaired fasting glucose: Secondary | ICD-10-CM | POA: Diagnosis not present

## 2018-09-11 DIAGNOSIS — I10 Essential (primary) hypertension: Secondary | ICD-10-CM | POA: Diagnosis not present

## 2018-09-25 DIAGNOSIS — M47816 Spondylosis without myelopathy or radiculopathy, lumbar region: Secondary | ICD-10-CM | POA: Diagnosis not present

## 2018-09-26 DIAGNOSIS — M5136 Other intervertebral disc degeneration, lumbar region: Secondary | ICD-10-CM | POA: Diagnosis not present

## 2018-09-26 DIAGNOSIS — M419 Scoliosis, unspecified: Secondary | ICD-10-CM | POA: Diagnosis not present

## 2018-09-26 DIAGNOSIS — M47816 Spondylosis without myelopathy or radiculopathy, lumbar region: Secondary | ICD-10-CM | POA: Diagnosis not present

## 2018-10-03 DIAGNOSIS — M47816 Spondylosis without myelopathy or radiculopathy, lumbar region: Secondary | ICD-10-CM | POA: Diagnosis not present

## 2018-10-31 DIAGNOSIS — M47816 Spondylosis without myelopathy or radiculopathy, lumbar region: Secondary | ICD-10-CM | POA: Diagnosis not present

## 2018-10-31 DIAGNOSIS — I10 Essential (primary) hypertension: Secondary | ICD-10-CM | POA: Diagnosis not present

## 2018-11-09 DIAGNOSIS — Z0000 Encounter for general adult medical examination without abnormal findings: Secondary | ICD-10-CM | POA: Diagnosis not present

## 2018-11-14 DIAGNOSIS — M545 Low back pain: Secondary | ICD-10-CM | POA: Diagnosis not present

## 2018-11-14 DIAGNOSIS — Z79899 Other long term (current) drug therapy: Secondary | ICD-10-CM | POA: Diagnosis not present

## 2018-11-14 DIAGNOSIS — M546 Pain in thoracic spine: Secondary | ICD-10-CM | POA: Diagnosis not present

## 2018-11-15 DIAGNOSIS — K5903 Drug induced constipation: Secondary | ICD-10-CM | POA: Diagnosis not present

## 2018-12-04 DIAGNOSIS — M545 Low back pain: Secondary | ICD-10-CM | POA: Diagnosis not present

## 2018-12-04 DIAGNOSIS — Z1159 Encounter for screening for other viral diseases: Secondary | ICD-10-CM | POA: Diagnosis not present

## 2018-12-04 DIAGNOSIS — Z79899 Other long term (current) drug therapy: Secondary | ICD-10-CM | POA: Diagnosis not present

## 2018-12-04 DIAGNOSIS — M546 Pain in thoracic spine: Secondary | ICD-10-CM | POA: Diagnosis not present

## 2018-12-04 DIAGNOSIS — R0602 Shortness of breath: Secondary | ICD-10-CM | POA: Diagnosis not present

## 2018-12-09 DIAGNOSIS — Z79899 Other long term (current) drug therapy: Secondary | ICD-10-CM | POA: Diagnosis not present

## 2018-12-09 DIAGNOSIS — R0602 Shortness of breath: Secondary | ICD-10-CM | POA: Diagnosis not present

## 2018-12-09 DIAGNOSIS — Z7189 Other specified counseling: Secondary | ICD-10-CM | POA: Diagnosis not present

## 2018-12-13 DIAGNOSIS — M546 Pain in thoracic spine: Secondary | ICD-10-CM | POA: Diagnosis not present

## 2018-12-13 DIAGNOSIS — M545 Low back pain: Secondary | ICD-10-CM | POA: Diagnosis not present

## 2018-12-13 DIAGNOSIS — R0602 Shortness of breath: Secondary | ICD-10-CM | POA: Diagnosis not present

## 2018-12-13 DIAGNOSIS — Z79899 Other long term (current) drug therapy: Secondary | ICD-10-CM | POA: Diagnosis not present

## 2018-12-15 DIAGNOSIS — M545 Low back pain: Secondary | ICD-10-CM | POA: Diagnosis not present

## 2018-12-15 DIAGNOSIS — K5903 Drug induced constipation: Secondary | ICD-10-CM | POA: Diagnosis not present

## 2018-12-18 DIAGNOSIS — R0602 Shortness of breath: Secondary | ICD-10-CM | POA: Diagnosis not present

## 2018-12-18 DIAGNOSIS — Z79899 Other long term (current) drug therapy: Secondary | ICD-10-CM | POA: Diagnosis not present

## 2018-12-18 DIAGNOSIS — M545 Low back pain: Secondary | ICD-10-CM | POA: Diagnosis not present

## 2018-12-18 DIAGNOSIS — M546 Pain in thoracic spine: Secondary | ICD-10-CM | POA: Diagnosis not present

## 2018-12-18 DIAGNOSIS — Z1159 Encounter for screening for other viral diseases: Secondary | ICD-10-CM | POA: Diagnosis not present

## 2018-12-23 DIAGNOSIS — Z79891 Long term (current) use of opiate analgesic: Secondary | ICD-10-CM | POA: Diagnosis not present

## 2018-12-23 DIAGNOSIS — Z79899 Other long term (current) drug therapy: Secondary | ICD-10-CM | POA: Diagnosis not present

## 2018-12-23 DIAGNOSIS — Z1159 Encounter for screening for other viral diseases: Secondary | ICD-10-CM | POA: Diagnosis not present

## 2018-12-28 DIAGNOSIS — H9193 Unspecified hearing loss, bilateral: Secondary | ICD-10-CM | POA: Diagnosis not present

## 2018-12-29 DIAGNOSIS — Z87891 Personal history of nicotine dependence: Secondary | ICD-10-CM | POA: Diagnosis not present

## 2018-12-29 DIAGNOSIS — M545 Low back pain: Secondary | ICD-10-CM | POA: Diagnosis not present

## 2018-12-29 DIAGNOSIS — Z122 Encounter for screening for malignant neoplasm of respiratory organs: Secondary | ICD-10-CM | POA: Diagnosis not present

## 2018-12-29 DIAGNOSIS — Z79899 Other long term (current) drug therapy: Secondary | ICD-10-CM | POA: Diagnosis not present

## 2018-12-29 DIAGNOSIS — R0602 Shortness of breath: Secondary | ICD-10-CM | POA: Diagnosis not present

## 2018-12-29 DIAGNOSIS — M546 Pain in thoracic spine: Secondary | ICD-10-CM | POA: Diagnosis not present

## 2018-12-29 DIAGNOSIS — Z1159 Encounter for screening for other viral diseases: Secondary | ICD-10-CM | POA: Diagnosis not present

## 2019-01-12 DIAGNOSIS — Z79899 Other long term (current) drug therapy: Secondary | ICD-10-CM | POA: Diagnosis not present

## 2019-01-12 DIAGNOSIS — M546 Pain in thoracic spine: Secondary | ICD-10-CM | POA: Diagnosis not present

## 2019-01-12 DIAGNOSIS — Z1159 Encounter for screening for other viral diseases: Secondary | ICD-10-CM | POA: Diagnosis not present

## 2019-01-12 DIAGNOSIS — M545 Low back pain: Secondary | ICD-10-CM | POA: Diagnosis not present

## 2019-01-12 DIAGNOSIS — R0602 Shortness of breath: Secondary | ICD-10-CM | POA: Diagnosis not present

## 2019-01-19 DIAGNOSIS — R918 Other nonspecific abnormal finding of lung field: Secondary | ICD-10-CM | POA: Diagnosis not present

## 2019-02-03 DIAGNOSIS — M545 Low back pain: Secondary | ICD-10-CM | POA: Diagnosis not present

## 2019-02-03 DIAGNOSIS — M546 Pain in thoracic spine: Secondary | ICD-10-CM | POA: Diagnosis not present

## 2019-02-03 DIAGNOSIS — R0602 Shortness of breath: Secondary | ICD-10-CM | POA: Diagnosis not present

## 2019-02-03 DIAGNOSIS — Z79899 Other long term (current) drug therapy: Secondary | ICD-10-CM | POA: Diagnosis not present

## 2019-03-05 DIAGNOSIS — Z1159 Encounter for screening for other viral diseases: Secondary | ICD-10-CM | POA: Diagnosis not present

## 2019-03-05 DIAGNOSIS — R0602 Shortness of breath: Secondary | ICD-10-CM | POA: Diagnosis not present

## 2019-03-05 DIAGNOSIS — M546 Pain in thoracic spine: Secondary | ICD-10-CM | POA: Diagnosis not present

## 2019-03-05 DIAGNOSIS — Z79899 Other long term (current) drug therapy: Secondary | ICD-10-CM | POA: Diagnosis not present

## 2019-03-05 DIAGNOSIS — M545 Low back pain: Secondary | ICD-10-CM | POA: Diagnosis not present

## 2019-03-05 DIAGNOSIS — F17210 Nicotine dependence, cigarettes, uncomplicated: Secondary | ICD-10-CM | POA: Diagnosis not present

## 2019-03-07 ENCOUNTER — Institutional Professional Consult (permissible substitution): Payer: Medicare PPO | Admitting: Internal Medicine

## 2019-04-02 ENCOUNTER — Ambulatory Visit (INDEPENDENT_AMBULATORY_CARE_PROVIDER_SITE_OTHER): Payer: Medicare Other | Admitting: Internal Medicine

## 2019-04-02 ENCOUNTER — Other Ambulatory Visit: Payer: Self-pay | Admitting: *Deleted

## 2019-04-02 ENCOUNTER — Encounter: Payer: Self-pay | Admitting: Internal Medicine

## 2019-04-02 ENCOUNTER — Other Ambulatory Visit: Payer: Self-pay

## 2019-04-02 VITALS — BP 122/64 | HR 77 | Ht 70.0 in | Wt 179.2 lb

## 2019-04-02 DIAGNOSIS — R918 Other nonspecific abnormal finding of lung field: Secondary | ICD-10-CM | POA: Diagnosis not present

## 2019-04-02 DIAGNOSIS — R911 Solitary pulmonary nodule: Secondary | ICD-10-CM

## 2019-04-02 DIAGNOSIS — Q782 Osteopetrosis: Secondary | ICD-10-CM | POA: Diagnosis not present

## 2019-04-02 DIAGNOSIS — J449 Chronic obstructive pulmonary disease, unspecified: Secondary | ICD-10-CM

## 2019-04-02 DIAGNOSIS — Z87891 Personal history of nicotine dependence: Secondary | ICD-10-CM

## 2019-04-02 MED ORDER — SPIRIVA RESPIMAT 2.5 MCG/ACT IN AERS: 2.0000 | INHALATION_SPRAY | Freq: Every day | RESPIRATORY_TRACT | 5 refills | Status: DC

## 2019-04-02 NOTE — Progress Notes (Signed)
IOV 07/08/2015  Chief Complaint  Patient presents with  . Pulmonary Consult    Pt self referred for left lower lung nodule. Pt c/o sinus drainage, DOE, prod cough with clear mucus. Pt denies CP/tightness.      64 year old male 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 He is a chronic smoker. He has sleep apnea and uses CPAP. His chronic cough and mild chronic shortness of breath and he has never paid attention to this. Most recently with the spring season his cough got a little bit worse or excess primary care physician present resulted in a chest x-ray was then led to a CT scan of the chest a few weeks ago he did this was done locally. I personally visualized the film on a CT from. He shows centrilobular emphysema along with a 1 cm smooth left lower lobe nodule. Therefore he is been referred for follow-up. He took a sample of Brio for a few weeks with due to his cough and it did help him but his wife says that he has significant cough that day and night.  He is a smoker. He tried to quit in the past with Chantix. This helped reduce the amount of cigarettes. However he had depression and his primary care physician a month ago put him on Zoloft), and took him off Chantix. He is back up on his cigarettes now. Some point he is interested in quitting smoking.   OV 07/17/2015  Chief Complaint  Patient presents with  . Follow-up    PET scan review. Pt c/o occasional cough and sinus drainage, some SOB with exertion. Pt denies wheeze/CP/tightness. Pt has had continued weight loss >33mos.     Presents with his wife to review results and to discuss smoking  #Smoking: He continues to smoke. It is very difficult for him to quit. He is not taking Chantix because his primary care physician put him on Zoloft and did not want to add Chantix to this. He has tried Wellbutrin in the past.  He is willing to wean down to the extent possible his smoking and review this in 3 months at follow-up. He has no suicidal or homicidal behavior  #COPD: PFTs reviewed shows moderate COPD with low DLCO of 70%. He is only on albuterol as needed, He has not had an alpha 1 check  Immunization History  Administered Date(s) Administered  . Influenza,inj,Quad PF,36+ Mos 09/30/2014    #Lung nodule = PET scan as follows   Nm Pet Image Initial (pi) Skull Base To Thigh  07/16/2015  CLINICAL DATA:  Initial initial treatment strategy for pulmonary nodule. EXAM: NUCLEAR MEDICINE PET SKULL BASE TO THIGH TECHNIQUE: 7.7 mCi F-18 FDG was injected intravenously. Full-ring PET imaging was performed from the skull base to thigh after the radiotracer. CT data was obtained and used for attenuation correction and anatomic localization. FASTING BLOOD GLUCOSE:  Value: 131 mg/dl COMPARISON:  CT 06/26/2015 FINDINGS: NECK No hypermetabolic lymph nodes in the neck. CHEST The nodule of concern in the LEFT lower lobe measures 10 mm (image 55, series 6) and has faint associated metabolic activity (SUV max equals 0.9). No additional suspicious pulmonary nodules. No hypermetabolic mediastinal lymph nodes. ABDOMEN/PELVIS No abnormal hypermetabolic activity within the liver, pancreas, adrenal glands, or spleen. No hypermetabolic lymph nodes in the abdomen or pelvis. SKELETON No focal hypermetabolic activity to suggest  skeletal metastasis. Round sclerotic lesion in the RIGHT iliac bone measures 7 mm. This does not have associated metabolic activity and therefore benign. IMPRESSION: 1. Very low metabolic activity in the LEFT lower lobe pulmonary nodule suggest benign etiology. As some low-grade neoplasms can have low metabolic activity, recommend follow-up CT in 3 months to demonstrate volume stability. 2. No evidence of metastatic disease. Electronically Signed   By: Suzy Bouchard M.D.   On: 07/16/2015 10:39  OV 04/02/2019  Subjective:    Patient ID: Jacob Graves, male , DOB: [birth year] , age 47 y.o. , MRN: [id number] , ADDRESS: [address] [facility] [id number]   04/02/2019 -   Chief Complaint  Patient presents with  . Consult    Last seen 07/17/15.  Pt states he has problems with SOB that can happen at any time.   New consult because it has been more than 3 years almost 4 years since I saw the patient.  Background issues were #Moderate COPD alpha-1 MM #Left lower lobe lung nodule 1 cm smooth with negative PET scan/low-grade uptake PET scan in May 2017 associated with right iliac bone sclerotic lesion -felt to follow-up #Ongoing smoking history #Baseline sleep apnea on CPAP    HPI SAAHIL KUREK 64 y.o. -presents with his wife.  He has baseline sleep apnea.  I personally not seen him in coming up for years.  In the interim he continues to be disabled.  He continues to use CPAP.  He has been seeing a physician at [facility] for his chronic pain.  He is now on Suboxone and he says this worked the best for him.  In terms of his COPD he is only on ProAir.  Is not taking any other inhalers.  He is not sure why.  In terms of his symptoms COPD CAT score is 20 and stable.  He feels overall he stable in the last 4 years.  In terms of his lung nodule: His primary physician who takes care of his pain did a CT scan of the chest.  I only have the report with me.  I do not have the images.  Based on the description I think it is old left lower lobe nodule that is still present it is smooth in contour.  No new changes reported.  There is no mention about the right iliac crest sclerotic lesion that was seen in the PET scan.  In terms of his smoking: He continues to smoke.  In terms of overall preventative health: He says he does not believe in the Covid vaccine and is does not want to take it.  I reviewed the old chart to get this data.  I also reviewed the referring records.  He says he had a spirometry but I do not have those  results.   CAT COPD Symptom & Quality of Life Score (GSK trademark) 0 is no burden. 5 is highest burden 04/02/2019   Never Cough -> Cough all the time 3  No phlegm in chest -> Chest is full of phlegm 3  No chest tightness -> Chest feels very tight 1  No dyspnea for 1 flight stairs/hill -> Very dyspneic for 1 flight of stairs 3  No limitations for ADL at home -> Very limited with ADL at home 3  Confident leaving home -> Not at all confident leaving home 1  Sleep soundly -> Do not sleep soundly because of lung condition 2  Lots of Energy -> No energy  at all 4  TOTAL Score (max 40)  20      ROS - per HPI     has a past medical history of Arthritis, Arthritis, Cancer ([place]), Centrilobular emphysema ([place]) (07/08/2015), Emphysema of lung ([place]), Hypercholesterolemia, Hypertension, Kyphosis, Migraine, Neuropathy, Scoliosis, Sleep apnea, and Tinnitus.   reports that he has been smoking cigarettes. He started smoking about 47 years ago. He has been smoking about 1.50 packs per day. He has never used smokeless tobacco.  Past Surgical History:  Procedure Laterality Date  . COLONOSCOPY  2016   Colon polyps. Tubular Adenoma.  . TONSILLECTOMY      Allergies  Allergen Reactions  . Codeine Nausea Only and Nausea And Vomiting  . Penicillins Itching and Other (See Comments)    As child     Immunization History  Administered Date(s) Administered  . Influenza,inj,Quad PF,6+ Mos 09/30/2014  . Pneumococcal Conjugate-13 07/17/2015    Family History  Problem Relation Age of Onset  . Heart disease Father   . Stroke Father   . Breast cancer Maternal Grandmother   . Leukemia Mother   . Skin cancer Mother   . Diabetes Mother   . Stroke Mother   . Hypertension Mother   . Skin cancer Brother   . Colon cancer Neg Hx      Current Outpatient Medications:  .  albuterol (PROVENTIL HFA;VENTOLIN HFA) 108 (90 Base) MCG/ACT inhaler, Inhale 2 puffs into the lungs every 6 (six) hours as needed for  wheezing or shortness of breath., Disp: , Rfl:  .  Aspirin-Acetaminophen-Caffeine (GOODYS EXTRA STRENGTH) 500-325-65 MG PACK, Take 325 mg by mouth 2 (two) times daily as needed., Disp: , Rfl:  .  atorvastatin (LIPITOR) 40 MG tablet, Take 40 mg by mouth daily., Disp: , Rfl:  .  Buprenorphine HCl-Naloxone HCl 8-2 MG FILM, , Disp: , Rfl:  .  loratadine (CLARITIN) 10 MG tablet, Take 10 mg by mouth daily., Disp: , Rfl:  .  omeprazole (PRILOSEC) 20 MG capsule, Take by mouth., Disp: , Rfl:  .  propranolol (INDERAL) 60 MG tablet, Take 60 mg by mouth 2 (two) times daily., Disp: , Rfl:  .  tamsulosin (FLOMAX) 0.4 MG CAPS capsule, Take 0.4 mg by mouth daily after breakfast., Disp: , Rfl:  .  gabapentin (NEURONTIN) 300 MG capsule, Take 300 mg by mouth 3 (three) times daily., Disp: , Rfl:       Objective:   Vitals:   04/02/19 1140  BP: 122/64  Pulse: 77  SpO2: 93%  Weight: 179 lb 3.2 oz (81.3 kg)  Height: 5\' 10"  (1.778 m)    Estimated body mass index is 25.71 kg/m as calculated from the following:   Height as of this encounter: 5\' 10"  (1.778 m).   Weight as of this encounter: 179 lb 3.2 oz (81.3 kg).  @WEIGHTCHANGE @  [employer]   04/02/19 1140  Weight: 179 lb 3.2 oz (81.3 kg)     Physical Exam  General Appearance:    Alert, cooperative, no distress, appears stated age - yes , Deconditioned looking - no , OBESE  - no, Sitting on Wheelchair -  no  Head:    Normocephalic, without obvious abnormality, atraumatic  Eyes:    PERRL, conjunctiva/corneas clear,  Ears:    Normal TM's and external ear canals, both ears  Nose:   Nares normal, septum midline, mucosa normal, no drainage    or sinus tenderness. OXYGEN ON  - no . Patient is @ ra  Throat:   Lips, mucosa, and tongue normal; teeth and gums normal. Cyanosis on lips - no  Neck:   Supple, symmetrical, trachea midline, no adenopathy;    thyroid:  no enlargement/tenderness/nodules; no carotid   bruit or JVD  Back:     Symmetric, no  curvature, ROM normal, no CVA tenderness  Lungs:     Distress - no , Wheeze no, Barrell Chest - no, Purse lip breathing - no, Crackles - no   Chest Wall:    No tenderness or deformity.    Heart:    Regular rate and rhythm, S1 and S2 normal, no rub   or gallop, Murmur - no  Breast Exam:    NOT DONE  Abdomen:     Soft, non-tender, bowel sounds active all four quadrants,    no masses, no organomegaly. Visceral obesity - no  Genitalia:   NOT DONE  Rectal:   NOT DONE  Extremities:   Extremities - normal, Has Cane - no, Clubbing - no, Edema - no  Pulses:   2+ and symmetric all extremities  Skin:   Stigmata of Connective Tissue Disease - no  Lymph nodes:   Cervical, supraclavicular, and axillary nodes normal  Psychiatric:  Neurologic:   Pleasant - yes, Anxious - no, Flat affect - yes  CAm-ICU - neg, Alert and Oriented x 3 - yes, Moves all 4s - yes, Speech - normal, Cognition - intact           Assessment:       ICD-10-CM   1. Nodule of left lung  R91.1 NM PET Image Initial (PI) Skull Base To Thigh  2. Iliac crest osteosclerosis  Q78.2   3. COPD, moderate ([place])  J44.9 Pulmonary function test  4. Smoking history  Z87.891 NM PET Image Initial (PI) Skull Base To Thigh  5. Abnormal findings on diagnostic imaging of lung  R91.8 NM PET Image Initial (PI) Skull Base To Thigh       Plan:     Patient Instructions     ICD-9-CM ICD-10-CM   1. Nodule of left lung 793.11 R91.1   2. Smoking history V15.82 Z72.0   3. COPD, moderate ([place]) 496 J44.9     #Left lung nodule - 1cm smooth  With low pet activity in May 2017. And repeat CT Nov [facility] - similar findings. THe nodule is likely benign though smoking history  #Rt iliac bone sclerotic lesion  plan -Do PET scan for above issues  #Smoking history - Try cutting down on cigarettes by yourself  #Moderate COPD   Stable diseae  plan - do spirometry pre and bost BD first available - Continue albuterol as  needed -Start spiriva respimat  Daily scheduled   - price it and expensive let us know - respect decision to refuse covid vaccine  #Follow-up - 4 weeks with APP to review PET scan result and symptom response to spiriva  - no need to wait for PFT     SIGNATURE    Dr. Brand Males, M.D., F.C.C.P,  Pulmonary and Critical Care Medicine Staff Physician, [place] Director - Interstitial Lung Disease  Program  Pulmonary [place] at [place], [place], [address]  Pager: [phone], If no answer or between  15:00h - 7:00h: call [phone] Telephone: [phone]  12:26 PM 04/02/2019

## 2019-04-02 NOTE — Patient Instructions (Addendum)
ICD-9-CM ICD-10-CM   1. Nodule of left lung 793.11 R91.1   2. Smoking history V15.82 Z72.0   3. COPD, moderate ([place]) 496 J44.9     #Left lung nodule - 1cm smooth  With low pet activity in May 2017. And repeat CT Nov 2020 at [facility] - similar findings. THe nodule is likely benign though smoking history  #Rt iliac bone sclerotic lesion  plan -Do PET scan for above issues  #Smoking history - Try cutting down on cigarettes by yourself  #Moderate COPD   Stable diseae  plan - do spirometry pre and bost BD first available - Continue albuterol as needed -Start spiriva respimat  Daily scheduled   - price it and expensive let us know - respect decision to refuse covid vaccine  #Follow-up - 4 weeks with APP to review PET scan result and symptom response to spiriva  - no need to wait for PFT

## 2019-04-04 DIAGNOSIS — M545 Low back pain: Secondary | ICD-10-CM | POA: Diagnosis not present

## 2019-04-04 DIAGNOSIS — F17210 Nicotine dependence, cigarettes, uncomplicated: Secondary | ICD-10-CM | POA: Diagnosis not present

## 2019-04-04 DIAGNOSIS — M546 Pain in thoracic spine: Secondary | ICD-10-CM | POA: Diagnosis not present

## 2019-04-04 DIAGNOSIS — Z79899 Other long term (current) drug therapy: Secondary | ICD-10-CM | POA: Diagnosis not present

## 2019-04-04 DIAGNOSIS — Z79891 Long term (current) use of opiate analgesic: Secondary | ICD-10-CM | POA: Diagnosis not present

## 2019-04-09 ENCOUNTER — Encounter: Payer: Self-pay | Admitting: Family Medicine

## 2019-04-09 ENCOUNTER — Telehealth (INDEPENDENT_AMBULATORY_CARE_PROVIDER_SITE_OTHER): Payer: Medicare Other | Admitting: Family Medicine

## 2019-04-09 VITALS — BP 162/98 | HR 78 | Temp 98.7°F | Wt 178.0 lb

## 2019-04-09 DIAGNOSIS — J018 Other acute sinusitis: Secondary | ICD-10-CM | POA: Diagnosis not present

## 2019-04-09 DIAGNOSIS — R911 Solitary pulmonary nodule: Secondary | ICD-10-CM

## 2019-04-09 DIAGNOSIS — J019 Acute sinusitis, unspecified: Secondary | ICD-10-CM | POA: Insufficient documentation

## 2019-04-09 DIAGNOSIS — I10 Essential (primary) hypertension: Secondary | ICD-10-CM | POA: Insufficient documentation

## 2019-04-09 MED ORDER — AZITHROMYCIN 250 MG PO TABS: ORAL_TABLET | ORAL | 0 refills | Status: DC

## 2019-04-09 NOTE — Progress Notes (Signed)
Virtual Visit via Telephone Note   This visit type was conducted due to national recommendations for restrictions regarding the COVID-19 Pandemic (e.g. social distancing) in an effort to limit this patient's exposure and mitigate transmission in our community.  Due to his co-morbid illnesses, this patient is at least at moderate risk for complications without adequate follow up.  This format is felt to be most appropriate for this patient at this time.  The patient did not have access to video technology/had technical difficulties with video requiring transitioning to audio format only (telephone).  All issues noted in this document were discussed and addressed.  No physical exam could be performed with this format.  Patient verbally consented to a telehealth visit.   Date:  04/09/2019   ID:  Jacob Graves, DOB 1955-04-22, MRN [id number]  Patient Location: Home Provider Location: Office  PCP:  Rochel Brome, MD   Evaluation Performed:  Acute visit  Chief Complaint:  Sinus congestion  History of Present Illness:    Jacob Graves is a 64 y.o. male with sinus congestion.  The patient denies fever, chills, sweats or coughing. He does have nasal congestion and some shortness of breath. Pulmonary started pt on spiriva 2 inhalation daily and he has been using ventolin 2 puffs twice a day. He has had increasing symptoms for 3 weeks. His neighbor has covid 41, but he has not been around him.    Past Medical History:  Diagnosis Date  . Apnea   . Arthritis   . Arthritis    spinal arthritis  . Cancer (HCC)    melanoma, basal cell  . Centrilobular emphysema ([place]) 07/08/2015  . Depression   . Emphysema of lung (HCC)    COPD  . GERD (gastroesophageal reflux disease)   . Hypercholesterolemia   . Hypertension   . Impaired fasting glucose   . Kyphosis    mild  . Migraine   . Neuropathy   . Osteoarthritis   . Other testicular dysfunction   . Scoliosis   . Sleep apnea   . Solitary pulmonary  nodule   . Tinnitus    Past Surgical History:  Procedure Laterality Date  . COLONOSCOPY  2016   Colon polyps. Tubular Adenoma.  . TONSILLECTOMY       No outpatient medications have been marked as taking for the 04/09/19 encounter (Video Visit) with Rochel Brome, MD.     Allergies:   Codeine, Duloxetine, Fentanyl, Fluoxetine, Ipratropium-albuterol, Penicillins, and Zoloft [sertraline]   Social History   Tobacco Use  . Smoking status: Current Every Day Smoker    Packs/day: 1.50    Types: Cigarettes    Start date: 29  . Smokeless tobacco: Never Used  . Tobacco comment: currently smoking 1ppd  Substance Use Topics  . Alcohol use: No    Alcohol/week: 0.0 standard drinks  . Drug use: No     Family Hx: The patient's family history includes Breast cancer in his maternal grandmother; Cancer in his brother; Diabetes in his mother; Heart disease in his father; Hypertension in his brother and mother; Leukemia in his mother; Skin cancer in his brother and mother; Stroke in his father and mother. There is no history of Colon cancer.  ROS:  Review of Systems - ENT ROS: positive for - headaches, nasal congestion, sinus pain and sneezing   Labs/Other Tests and Data Reviewed:    Recent Labs: No results found for requested labs within last 8760 hours.   Recent Lipid Panel  No results found for: CHOL, TRIG, HDL, CHOLHDL, LDLCALC, LDLDIRECT  Wt Readings from Last 3 Encounters:  04/09/19 178 lb (80.7 kg)  04/02/19 179 lb 3.2 oz (81.3 kg)  07/15/17 173 lb 8 oz (78.7 kg)     Objective:    Vital Signs:  BP (!) 162/98   Pulse 78   Temp 98.7 F (37.1 C)   Wt 178 lb (80.7 kg)   SpO2 95%   BMI 25.54 kg/m    VITAL SIGNS:  reviewed GEN:  no acute distress  ASSESSMENT & PLAN:    1. Other acute sinusitis - zpack sent to pharmacy. Continue ventolin 2 puffs qid prn and continue spiriva.  2. Hypertension - monitor bp daily and if persistently elevated. Call back. Consider change in his  medicines.  3. Pulmonary nodule  - Pulmonary has ordered a PET scan to evaluate nodule and "spot on hip" per patient report.   COVID-19 Education: The signs and symptoms of COVID-19 were discussed with the patient and how to seek care for testing (follow up with PCP or arrange E-visit). The importance of social distancing was discussed today.  Time:   Today, I have spent 2-10 minutes with the patient with telehealth technology discussing the above problems.     Medication Adjustments/Labs and Tests Ordered: Current medicines are reviewed at length with the patient today.  Concerns regarding medicines are outlined above.   Tests Ordered: Orders Placed This Encounter  Procedures  . HM COLONOSCOPY    Medication Changes: Meds ordered this encounter  Medications  . azithromycin (ZITHROMAX) 250 MG tablet    Sig: 2 on day 1, then one daily x 4 days.    Dispense:  6 tablet    Refill:  0    Follow Up:  Either In Person or Virtual prn  Signed, Rochel Brome, MD  04/09/2019 2:27 PM    [place]

## 2019-04-11 ENCOUNTER — Other Ambulatory Visit: Payer: Self-pay | Admitting: Family Medicine

## 2019-04-13 ENCOUNTER — Other Ambulatory Visit: Payer: Self-pay

## 2019-04-13 ENCOUNTER — Encounter (HOSPITAL_COMMUNITY)
Admission: RE | Admit: 2019-04-13 | Discharge: 2019-04-13 | Disposition: A | Discharge status: Home / Self Care | Payer: Medicare Other | Source: Ambulatory Visit | Attending: Internal Medicine | Admitting: Internal Medicine

## 2019-04-13 ENCOUNTER — Telehealth: Payer: Self-pay | Admitting: Internal Medicine

## 2019-04-13 DIAGNOSIS — Z79899 Other long term (current) drug therapy: Secondary | ICD-10-CM | POA: Diagnosis not present

## 2019-04-13 DIAGNOSIS — Z87891 Personal history of nicotine dependence: Secondary | ICD-10-CM

## 2019-04-13 DIAGNOSIS — J449 Chronic obstructive pulmonary disease, unspecified: Secondary | ICD-10-CM | POA: Insufficient documentation

## 2019-04-13 DIAGNOSIS — R918 Other nonspecific abnormal finding of lung field: Secondary | ICD-10-CM

## 2019-04-13 DIAGNOSIS — R911 Solitary pulmonary nodule: Secondary | ICD-10-CM | POA: Diagnosis not present

## 2019-04-13 LAB — GLUCOSE, CAPILLARY: Glucose-Capillary: 91 mg/dL (ref 70–99)

## 2019-04-13 MED ORDER — FLUDEOXYGLUCOSE F - 18 (FDG) INJECTION
8.8000 | Freq: Once | INTRAVENOUS | Status: AC
  Administered 2019-04-13: 10:00:00 8.8 via INTRAVENOUS

## 2019-04-13 NOTE — Telephone Encounter (Signed)
Received call report from Opal Sidles with [facility] on patient's PET scan done on 04/13/19. Beth please review the result/impression copied below:  IMPRESSION: 1. Stable well-circumscribed benign-appearing nodule in the left lower lobe. 2. Interval development of a tubular nodular appearing area in the lingula perhaps inspissated material within lingular bronchial. 3. Focal area of hypermetabolic change in the left upper lobe with non nodular morphology, more bandlike and associated with scattered ground-glass, findings may represent sequela of ongoing infection. Suggest 3 month follow-up chest CT for follow-up of this finding and the finding in impression 2, given the degraded nature of current lung evaluation. 4. Stable nodular focus in the right middle lobe likely post infectious. 5. Images from the outside facility are not available for review.  These results will be called to the ordering clinician or representative by the Radiologist Assistant, and communication documented in the PACS or zVision Dashboard.   Please advise, thank you.

## 2019-04-13 NOTE — Telephone Encounter (Signed)
Called patient to review PET scan results. No answer. Stable nodule left lower lobe, considered benign appearance. Focal area that was hypermetabolic (took up dye) in left upper lobe may represent ongoing infection. Needs 3 month follow-up CT chest wo contrast. Please order.

## 2019-04-13 NOTE — Telephone Encounter (Signed)
Order has been placed for the CT. Will keep encounter open until pt has been made aware of the PET scan results.

## 2019-04-16 NOTE — Telephone Encounter (Signed)
MR - please advise on PET scan results.

## 2019-04-17 ENCOUNTER — Telehealth: Payer: Self-pay

## 2019-04-17 ENCOUNTER — Other Ambulatory Visit: Payer: Self-pay | Admitting: Family Medicine

## 2019-04-17 MED ORDER — AZITHROMYCIN 250 MG PO TABS: ORAL_TABLET | ORAL | 0 refills | Status: DC

## 2019-04-17 NOTE — Telephone Encounter (Signed)
Degan called to report that he completed his antibiotic and he continues to have cough and some wheezing.  She has improved a great deal but has increased symptoms with increased activity.  Refill on z-pak sent to pharmacy per Dr. Tobie Poet.  He was instructed to recheck in the office if symptoms do not completely resolve after completion of the medication

## 2019-04-18 NOTE — Telephone Encounter (Signed)
Dr. Chase Caller,  Can you please break down the results in a simplified format the patent will be able to understand please? Thank you.

## 2019-04-18 NOTE — Telephone Encounter (Signed)
      IMPRESSION: 1. Stable well-circumscribed benign-appearing nodule in the left lower lobe. - this was the nodule on CT and why we got PET -> benign o PET  2. Interval development of a tubular nodular appearing area in the lingula perhaps inspissated material within lingular bronchial. - new  3. Focal area of hypermetabolic change in the left upper lobe with non nodular morphology, more bandlike and associated with scattered ground-glass, findings may represent sequela of ongoing infection.- new  PLAN   3 month follow-up chest CT (can do this without contrast) for follow-up of this finding and the finding in impression 2, given the degraded nature of current lung evaluation.

## 2019-04-19 NOTE — Telephone Encounter (Signed)
pls make video or telephone visit with me or APP next week. Is easier that way - to go over results

## 2019-04-23 ENCOUNTER — Telehealth (INDEPENDENT_AMBULATORY_CARE_PROVIDER_SITE_OTHER): Payer: Medicare Other | Admitting: Adult Health

## 2019-04-23 ENCOUNTER — Encounter: Payer: Self-pay | Admitting: Adult Health

## 2019-04-23 DIAGNOSIS — R918 Other nonspecific abnormal finding of lung field: Secondary | ICD-10-CM | POA: Diagnosis not present

## 2019-04-23 DIAGNOSIS — Z87891 Personal history of nicotine dependence: Secondary | ICD-10-CM

## 2019-04-23 DIAGNOSIS — R911 Solitary pulmonary nodule: Secondary | ICD-10-CM

## 2019-04-23 DIAGNOSIS — J449 Chronic obstructive pulmonary disease, unspecified: Secondary | ICD-10-CM

## 2019-04-23 MED ORDER — ALBUTEROL SULFATE HFA 108 (90 BASE) MCG/ACT IN AERS: 2.0000 | INHALATION_SPRAY | Freq: Four times a day (QID) | RESPIRATORY_TRACT | 5 refills | Status: DC | PRN

## 2019-04-23 MED ORDER — SPIRIVA RESPIMAT 2.5 MCG/ACT IN AERS: 2.0000 | INHALATION_SPRAY | Freq: Every day | RESPIRATORY_TRACT | 5 refills | Status: DC

## 2019-04-23 NOTE — Telephone Encounter (Signed)
Spoke with the pt and scheduled video visit with TP today at 11 am

## 2019-04-23 NOTE — Progress Notes (Signed)
Virtual Visit via Video Note  I connected with Jacob Graves on 04/23/19 at  2:30 PM EST by a video enabled telemedicine application and verified that I am speaking with the correct person using two identifiers.  Location: Patient: Home  Provider: Office    I discussed the limitations of evaluation and management by telemedicine and the availability of in person appointments. The patient expressed understanding and agreed to proceed.  History of Present Illness: 64 year old male active smoker followed for moderate COPD, left lower lung nodule, obstructive sleep apnea on nocturnal CPAP  Medical history significant for chronic pain  Today's video visit is a 3-week follow-up for COPD and left lower lung nodule.  Patient has a known 1 cm left lower lung nodule noted since 2017 with low PET activity in May 2017.  CT chest November 2020 done at [facility] showed similar findings.  Also noted a right iliac bone sclerotic lesion.  Patient underwent a PET scan on 6 April 13, 2019 that showed unchanged left lower lobe nodule 10 x 7.8 mm nodule (SUV max 0.3) interval development of a tubular nodule in the lingula perhaps material within the lingular nodule with recommendations for follow-up CT chest in 3 months.  Focal area of hypermetabolic changes in the left upper lobe with nonnodular morphology, more bandlike and associated with scattered groundglass possible sequela of infection stable nodular focus in the right middle lobe likely postinfectious.  No focal hypermetabolic activities to suggest skeletal metastasis. We reviewed his PET scan results.  Patient has underlying COPD.  He is on Spiriva daily.  Says overall breathing is doing better. Was treated for sinus infection last couple of weeks. Took ZPack last week. Feeling better with less cough and congestion.  Not very active. . Still smoking , discussed smoking cessation.        Observations/Objective: 04/23/2019 -speaks in full  sentences with no obvious distress.  PET 04/13/19 - Stable well-circumscribed benign-appearing nodule in the left lower lobe. 2. Interval development of a tubular nodular appearing area in the lingula perhaps inspissated material within lingular bronchial. 3. Focal area of hypermetabolic change in the left upper lobe with non nodular morphology, more bandlike and associated with scattered ground-glass, findings may represent sequela of ongoing infection. Suggest 3 month follow-up chest CT for follow-up of this finding and the finding in impression 2, given the degraded nature of current lung evaluation. 4. Stable nodular focus in the right middle lobe likely post infectious. 5. Images from the outside facility are not available for review.    Assessment and Plan: 1 COPD-recent flare with sinusitis now resolving after antibiotics. Encouraged on smoking cessation. Check PFTs on return  2. Left lower lobe lung nodule, abnormal CT chest and PET scan. Recent PET showed no hypermetabolic activity in the left lower lobe.  However did show some new areas of possible concern in the lingula and left upper lobe.  A follow-up CT chest has been planned for 3 months.  Discussed in detail with patient and wife his PET scan.  And plan for close follow-up with serial CT   3. Smoking abuse -smoking cessation discussed  Plan  Patient Instructions  Continue on Spiriva daily Activity as tolerated Work on not smoking CT chest without contrast in 3 months Follow-up in 1 month for PFT and office visit as planned.  Please contact office for sooner follow up if symptoms do not improve or worsen or seek emergency care    Copy of request PET  scan to [facility] Dr. Hubbard Robinson.      Follow Up Instructions:  Follow-up in 1 month and as needed    I discussed the assessment and treatment plan with the patient. The patient was provided an opportunity to ask questions and all were answered. The  patient agreed with the plan and demonstrated an understanding of the instructions.   The patient was advised to call back or seek an in-person evaluation if the symptoms worsen or if the condition fails to improve as anticipated.  I provided 31 minutes of non-face-to-face time during this encounter.   Rexene Edison, NP

## 2019-04-23 NOTE — Patient Instructions (Addendum)
Continue on Spiriva daily Activity as tolerated Work on not smoking CT chest without contrast in 3 months Follow-up in 1 month for PFT and office visit as planned.  Please contact office for sooner follow up if symptoms do not improve or worsen or seek emergency care    Copy of request PET scan to [facility] Dr. Hubbard Robinson.

## 2019-04-23 NOTE — Addendum Note (Signed)
Addended by: Parke Poisson E on: 04/23/2019 04:43 PM   Modules accepted: Orders

## 2019-04-26 ENCOUNTER — Other Ambulatory Visit: Payer: Self-pay | Admitting: Family Medicine

## 2019-04-26 ENCOUNTER — Ambulatory Visit (INDEPENDENT_AMBULATORY_CARE_PROVIDER_SITE_OTHER): Payer: Medicare Other | Admitting: Family Medicine

## 2019-04-26 ENCOUNTER — Other Ambulatory Visit: Payer: Self-pay

## 2019-04-26 ENCOUNTER — Encounter: Payer: Self-pay | Admitting: Family Medicine

## 2019-04-26 VITALS — BP 130/78 | HR 76 | Temp 98.4°F | Resp 18 | Ht 68.0 in | Wt 174.8 lb

## 2019-04-26 DIAGNOSIS — J301 Allergic rhinitis due to pollen: Secondary | ICD-10-CM | POA: Diagnosis not present

## 2019-04-26 MED ORDER — MONTELUKAST SODIUM 10 MG PO TABS: 10.0000 mg | ORAL_TABLET | Freq: Every day | ORAL | 0 refills | Status: DC

## 2019-04-26 NOTE — Progress Notes (Signed)
Acute Office Visit  Subjective:    Patient ID: AUSITN MANY, male    DOB: 1955/11/15, 64 y.o.   MRN: [id number]  Chief Complaint  Patient presents with  . Sinus Problem    HPI Patient is in today for persistent sinus symptoms.  Patient was seen via televisit on April 09, 2019.  At the time he was treated with a Z-Pak.  He is continue to have sinus drainage, cough, shortness of breath and congestion for 2 months.  Denies fevers, chills.  Denies aching.    Past Medical History:  Diagnosis Date  . Apnea   . Arthritis   . Arthritis    spinal arthritis  . Cancer (HCC)    melanoma, basal cell  . Centrilobular emphysema ([place]) 07/08/2015  . Depression   . Emphysema of lung (HCC)    COPD  . GERD (gastroesophageal reflux disease)   . Hypercholesterolemia   . Hypertension   . Impaired fasting glucose   . Kyphosis    mild  . Migraine   . Neuropathy   . Osteoarthritis   . Other testicular dysfunction   . Scoliosis   . Sleep apnea   . Solitary pulmonary nodule   . Tinnitus     Past Surgical History:  Procedure Laterality Date  . COLONOSCOPY  2016   Colon polyps. Tubular Adenoma.  . TONSILLECTOMY      Family History  Problem Relation Age of Onset  . Heart disease Father   . Stroke Father   . Breast cancer Maternal Grandmother   . Leukemia Mother   . Skin cancer Mother   . Diabetes Mother   . Stroke Mother   . Hypertension Mother   . Skin cancer Brother   . Cancer Brother        Melanoma  . Hypertension Brother   . Colon cancer Neg Hx     Social History   Socioeconomic History  . Marital status: Married    Spouse name: Baker Janus  . Number of children: 2  . Years of education: 50  . Highest education level: Not on file  Occupational History  . Occupation: disabled  Tobacco Use  . Smoking status: Current Every Day Smoker    Packs/day: 1.50    Types: Cigarettes    Start date: 80  . Smokeless tobacco: Never Used  . Tobacco comment: currently smoking 1ppd   Substance and Sexual Activity  . Alcohol use: No    Alcohol/week: 0.0 standard drinks  . Drug use: No  . Sexual activity: Not on file  Other Topics Concern  . Not on file  Social History Narrative   Lives with wife   Caffeine- coffee, 3 cups daily   Social Determinants of Health   Financial Resource Strain:   . Difficulty of Paying Living Expenses: Not on file  Food Insecurity:   . Worried About Charity fundraiser in the Last Year: Not on file  . Ran Out of Food in the Last Year: Not on file  Transportation Needs:   . Lack of Transportation (Medical): Not on file  . Lack of Transportation (Non-Medical): Not on file  Physical Activity:   . Days of Exercise per Week: Not on file  . Minutes of Exercise per Session: Not on file  Stress:   . Feeling of Stress : Not on file  Social Connections:   . Frequency of Communication with Friends and Family: Not on file  . Frequency of Social Gatherings  with Friends and Family: Not on file  . Attends Religious Services: Not on file  . Active Member of Clubs or Organizations: Not on file  . Attends Archivist Meetings: Not on file  . Marital Status: Not on file  Intimate Partner Violence:   . Fear of Current or Ex-Partner: Not on file  . Emotionally Abused: Not on file  . Physically Abused: Not on file  . Sexually Abused: Not on file    Outpatient Medications Prior to Visit  Medication Sig Dispense Refill  . albuterol (VENTOLIN HFA) 108 (90 Base) MCG/ACT inhaler Inhale 2 puffs into the lungs every 6 (six) hours as needed for wheezing or shortness of breath. 8 g 5  . atorvastatin (LIPITOR) 80 MG tablet Take 80 mg by mouth daily.    . Buprenorphine HCl-Naloxone HCl 8-2 MG FILM     . omeprazole (PRILOSEC) 40 MG capsule Take 40 mg by mouth daily.    . polyethylene glycol (MIRALAX / GLYCOLAX) 17 g packet Take 17 g by mouth daily as needed.    . propranolol (INDERAL) 60 MG tablet Take 60 mg by mouth daily.     . tamsulosin  (FLOMAX) 0.4 MG CAPS capsule Take 0.4 mg by mouth daily after breakfast.    . Tiotropium Bromide Monohydrate (SPIRIVA RESPIMAT) 2.5 MCG/ACT AERS Inhale 2 puffs into the lungs daily. 4 g 5  . Aspirin-Acetaminophen-Caffeine (GOODYS EXTRA STRENGTH) 500-325-65 MG PACK Take 325 mg by mouth 2 (two) times daily as needed.    . gabapentin (NEURONTIN) 300 MG capsule Take 300 mg by mouth 3 (three) times daily.    Marland Kitchen ibuprofen (ADVIL) 800 MG tablet TAKE 1 TABLET(800 MG) BY MOUTH THREE TIMES DAILY WITH FOOD 90 tablet 1  . atorvastatin (LIPITOR) 40 MG tablet Take 40 mg by mouth daily.    Marland Kitchen loratadine (CLARITIN) 10 MG tablet Take 10 mg by mouth daily.    Marland Kitchen omeprazole (PRILOSEC) 20 MG capsule Take by mouth.     No facility-administered medications prior to visit.    Allergies  Allergen Reactions  . Codeine Nausea Only and Nausea And Vomiting  . Duloxetine     Loss of appetite  . Fentanyl     Fatigue   . Fluoxetine     Agitation  . Ipratropium-Albuterol     Myalgia  . Penicillins Itching and Other (See Comments)    As child   . Zoloft [Sertraline]     Sweating     Review of Systems  Constitutional: Negative for chills and fever.  HENT: Positive for congestion, sinus pressure and sinus pain. Negative for ear pain and sore throat.   Respiratory: Positive for cough and shortness of breath.   Cardiovascular: Negative for chest pain.  Gastrointestinal: Negative for abdominal pain, constipation, nausea and vomiting.       Objective:    Physical Exam Constitutional:      Appearance: Normal appearance.  HENT:     Right Ear: Tympanic membrane and ear canal normal.     Left Ear: Tympanic membrane and ear canal normal.     Nose: Congestion and rhinorrhea present.     Mouth/Throat:     Mouth: Mucous membranes are moist.     Pharynx: No oropharyngeal exudate or posterior oropharyngeal erythema.  Cardiovascular:     Rate and Rhythm: Normal rate and regular rhythm.     Heart sounds: Normal  heart sounds.  Pulmonary:     Effort: Pulmonary effort is normal. No  respiratory distress.     Breath sounds: Normal breath sounds. No wheezing, rhonchi or rales.  Lymphadenopathy:     Cervical: No cervical adenopathy.  Neurological:     Mental Status: He is alert.     BP 130/78   Pulse 76   Temp 98.4 F (36.9 C)   Resp 18   Ht 5\' 8"  (1.727 m)   Wt 174 lb 12.8 oz (79.3 kg)   BMI 26.58 kg/m  Wt Readings from Last 3 Encounters:  04/26/19 174 lb 12.8 oz (79.3 kg)  04/09/19 178 lb (80.7 kg)  04/02/19 179 lb 3.2 oz (81.3 kg)    Health Maintenance Due  Topic Date Due  . Hepatitis C Screening  03[birth year]  . HIV Screening  10/22/1972  . TETANUS/TDAP  10/22/1976    There are no preventive care reminders to display for this patient.   No results found for: TSH Lab Results  Component Value Date   WBC 6.6 07/15/2017   HGB 14.3 07/15/2017   HCT 41.7 07/15/2017   MCV 91.1 07/15/2017   PLT 199.0 07/15/2017   Lab Results  Component Value Date   NA 140 07/15/2017   K 4.7 07/15/2017   CO2 32 07/15/2017   GLUCOSE 89 07/15/2017   BUN 14 07/15/2017   CREATININE 0.87 07/15/2017   BILITOT 0.4 07/15/2017   ALKPHOS 108 07/15/2017   AST 7 07/15/2017   ALT 14 07/15/2017   PROT 6.5 07/15/2017   ALBUMIN 4.1 07/15/2017   CALCIUM 8.7 07/15/2017   GFR 95.22 07/15/2017   No results found for: CHOL No results found for: HDL No results found for: LDLCALC No results found for: TRIG No results found for: CHOLHDL No results found for: HGBA1C     Assessment & Plan:   Problem List Items Addressed This Visit      Respiratory   Non-seasonal allergic rhinitis due to pollen - Primary    START ON SINGULAIR 10 MG ONCE DAILY. PT NEEDS TO STOP AFRIN NASAL SPRAY. SEND FLONASE INSTEAD.      Relevant Medications   fluticasone (FLONASE) 50 MCG/ACT nasal spray       Meds ordered this encounter  Medications  . DISCONTD: montelukast (SINGULAIR) 10 MG tablet    Sig: Take 1 tablet (10  mg total) by mouth at bedtime.    Dispense:  30 tablet    Refill:  0  I THOUGHT THIS WAS SENT FOR PATIENT. I AM CONFUSED WHY IT SAYS RX WAS CANCELLED. WILL CHECK WITH PATIENT ON NEXT BUSINESS DAY.   . fluticasone (FLONASE) 50 MCG/ACT nasal spray    Sig: Place 1 spray into both nostrils in the morning and at bedtime.    Dispense:  18.2 mL    Refill:  2     Rochel Brome, MD

## 2019-05-02 DIAGNOSIS — E7800 Pure hypercholesterolemia, unspecified: Secondary | ICD-10-CM | POA: Diagnosis not present

## 2019-05-02 DIAGNOSIS — Z03818 Encounter for observation for suspected exposure to other biological agents ruled out: Secondary | ICD-10-CM | POA: Diagnosis not present

## 2019-05-02 DIAGNOSIS — M545 Low back pain: Secondary | ICD-10-CM | POA: Diagnosis not present

## 2019-05-02 DIAGNOSIS — Z79899 Other long term (current) drug therapy: Secondary | ICD-10-CM | POA: Diagnosis not present

## 2019-05-02 DIAGNOSIS — F17210 Nicotine dependence, cigarettes, uncomplicated: Secondary | ICD-10-CM | POA: Diagnosis not present

## 2019-05-02 DIAGNOSIS — M546 Pain in thoracic spine: Secondary | ICD-10-CM | POA: Diagnosis not present

## 2019-05-02 DIAGNOSIS — Z79891 Long term (current) use of opiate analgesic: Secondary | ICD-10-CM | POA: Diagnosis not present

## 2019-05-06 ENCOUNTER — Encounter: Payer: Self-pay | Admitting: Family Medicine

## 2019-05-06 DIAGNOSIS — J301 Allergic rhinitis due to pollen: Secondary | ICD-10-CM | POA: Insufficient documentation

## 2019-05-06 DIAGNOSIS — H903 Sensorineural hearing loss, bilateral: Secondary | ICD-10-CM | POA: Insufficient documentation

## 2019-05-06 MED ORDER — FLUTICASONE PROPIONATE 50 MCG/ACT NA SUSP: 1.0000 | Freq: Two times a day (BID) | NASAL | 2 refills | Status: DC

## 2019-05-06 NOTE — Assessment & Plan Note (Signed)
START ON SINGULAIR 10 MG ONCE DAILY. PT NEEDS TO STOP AFRIN NASAL SPRAY. SEND FLONASE INSTEAD.

## 2019-05-06 NOTE — Patient Instructions (Signed)
START ON SINGULAIR 10 MG ONCE DAILY. PT NEEDS TO STOP AFRIN NASAL SPRAY. SEND FLONASE INSTEAD.

## 2019-05-10 ENCOUNTER — Telehealth: Payer: Self-pay

## 2019-05-10 NOTE — Telephone Encounter (Signed)
Patient was left a message to stop afrin and begin flonase and singulair.

## 2019-05-26 ENCOUNTER — Other Ambulatory Visit (HOSPITAL_COMMUNITY): Payer: Medicare Other

## 2019-05-29 DIAGNOSIS — F17210 Nicotine dependence, cigarettes, uncomplicated: Secondary | ICD-10-CM | POA: Diagnosis not present

## 2019-05-29 DIAGNOSIS — Z03818 Encounter for observation for suspected exposure to other biological agents ruled out: Secondary | ICD-10-CM | POA: Diagnosis not present

## 2019-05-29 DIAGNOSIS — R0602 Shortness of breath: Secondary | ICD-10-CM | POA: Diagnosis not present

## 2019-05-29 DIAGNOSIS — Z79899 Other long term (current) drug therapy: Secondary | ICD-10-CM | POA: Diagnosis not present

## 2019-05-29 DIAGNOSIS — Z79891 Long term (current) use of opiate analgesic: Secondary | ICD-10-CM | POA: Diagnosis not present

## 2019-05-29 DIAGNOSIS — M545 Low back pain: Secondary | ICD-10-CM | POA: Diagnosis not present

## 2019-05-29 DIAGNOSIS — D539 Nutritional anemia, unspecified: Secondary | ICD-10-CM | POA: Diagnosis not present

## 2019-05-29 DIAGNOSIS — M546 Pain in thoracic spine: Secondary | ICD-10-CM | POA: Diagnosis not present

## 2019-05-30 ENCOUNTER — Ambulatory Visit: Payer: Medicare Other | Admitting: Pulmonary Disease

## 2019-05-30 ENCOUNTER — Ambulatory Visit: Payer: Medicare Other | Admitting: Primary Care

## 2019-05-30 ENCOUNTER — Ambulatory Visit: Payer: Medicare Other | Admitting: Adult Health

## 2019-06-12 ENCOUNTER — Encounter: Payer: Self-pay | Admitting: Family Medicine

## 2019-06-12 NOTE — Progress Notes (Addendum)
TELEHEALTH VISIT  This visit type was conducted due to national recommendations for restrictions regarding the COVID-19 Pandemic (e.g. social distancing) in an effort to limit this patient's exposure and mitigate transmission in our community.  Due to his co-morbid illnesses, this patient is at least at moderate risk for complications without adequate follow up.  This format is felt to be most appropriate for this patient at this time.  The patient did not have access to video technology/had technical difficulties with video requiring transitioning to audio format only (telephone).  All issues noted in this document were discussed and addressed.  No physical exam could be performed with this format.  Patient verbally consented to a telehealth visit.  Location patient: home Location provider: office  Here for acute.      Established Patient Office Visit  Subjective:  Patient ID: DELVONTE Graves, male    DOB: 1956/02/09  Age: 64 y.o. MRN: [id number]  CC:  Chief Complaint  Patient presents with  . chronic pain syndrome    Wants referral to [facility]    HPI Jacob Graves presents for follow up of pain. He sees [facility]. He is concerned his NP or PA Jacob Graves) is ordering a lot of blood work and tests. He is discussing smoking cessation, vascular studies. The patient's insurance is not covering all this other tests. It is not drug screens. He would like to switch back to [facility]. He changed because his orthopedic spine specialist referred him to the [facility] C/o low back pain (scoliosis/kyphosis) and no leg pain, but feels legs are weak after walking.  He is currently on suboxone and gabapentin.   Past Medical History:  Diagnosis Date  . Apnea   . Arthritis   . Arthritis    spinal arthritis  . Cancer (HCC)    melanoma, basal cell  . Centrilobular emphysema ([place]) 07/08/2015  . Depression   . Emphysema of lung (HCC)    COPD  . GERD  (gastroesophageal reflux disease)   . Hypercholesterolemia   . Hypertension   . Impaired fasting glucose   . Kyphosis    mild  . Migraine   . Neuropathy   . Osteoarthritis   . Other testicular dysfunction   . Scoliosis   . Sleep apnea   . Solitary pulmonary nodule   . Tinnitus     Past Surgical History:  Procedure Laterality Date  . COLONOSCOPY  2016   Colon polyps. Tubular Adenoma.  . TONSILLECTOMY      Family History  Problem Relation Age of Onset  . Heart disease Father   . Stroke Father   . Breast cancer Maternal Grandmother   . Leukemia Mother   . Skin cancer Mother   . Diabetes Mother   . Stroke Mother   . Hypertension Mother   . Skin cancer Brother   . Cancer Brother        Melanoma  . Hypertension Brother   . Colon cancer Neg Hx     Social History   Socioeconomic History  . Marital status: Married    Spouse name: Jacob Graves  . Number of children: 2  . Years of education: 7  . Highest education level: Not on file  Occupational History  . Occupation: disabled  Tobacco Use  . Smoking status: Current Every Day Smoker    Packs/day: 1.50    Types: Cigarettes    Start date: 73  . Smokeless tobacco: Never Used  .  Tobacco comment: currently smoking 1ppd  Substance and Sexual Activity  . Alcohol use: No    Alcohol/week: 0.0 standard drinks  . Drug use: No  . Sexual activity: Not on file  Other Topics Concern  . Not on file  Social History Narrative   Lives with wife   Caffeine- coffee, 3 cups daily   Social Determinants of Health   Financial Resource Strain:   . Difficulty of Paying Living Expenses:   Food Insecurity:   . Worried About Charity fundraiser in the Last Year:   . Arboriculturist in the Last Year:   Transportation Needs:   . Film/video editor (Medical):   Marland Kitchen Lack of Transportation (Non-Medical):   Physical Activity:   . Days of Exercise per Week:   . Minutes of Exercise per Session:   Stress:   . Feeling of Stress :     Social Connections:   . Frequency of Communication with Friends and Family:   . Frequency of Social Gatherings with Friends and Family:   . Attends Religious Services:   . Active Member of Clubs or Organizations:   . Attends Archivist Meetings:   Marland Kitchen Marital Status:   Intimate Partner Violence:   . Fear of Current or Ex-Partner:   . Emotionally Abused:   Marland Kitchen Physically Abused:   . Sexually Abused:     Outpatient Medications Prior to Visit  Medication Sig Dispense Refill  . albuterol (VENTOLIN HFA) 108 (90 Base) MCG/ACT inhaler Inhale 2 puffs into the lungs every 6 (six) hours as needed for wheezing or shortness of breath. 8 g 5  . Aspirin-Acetaminophen-Caffeine (GOODYS EXTRA STRENGTH) 500-325-65 MG PACK Take 325 mg by mouth 2 (two) times daily as needed.    Marland Kitchen atorvastatin (LIPITOR) 80 MG tablet Take 80 mg by mouth daily.    . fluticasone (FLONASE) 50 MCG/ACT nasal spray Place 1 spray into both nostrils in the morning and at bedtime. 18.2 mL 2  . gabapentin (NEURONTIN) 300 MG capsule Take 400 mg by mouth 3 (three) times daily.     Marland Kitchen omeprazole (PRILOSEC) 40 MG capsule Take 40 mg by mouth daily.    . polyethylene glycol (MIRALAX / GLYCOLAX) 17 g packet Take 17 g by mouth daily as needed.    . propranolol (INDERAL) 60 MG tablet Take 60 mg by mouth daily.     . tamsulosin (FLOMAX) 0.4 MG CAPS capsule Take 0.4 mg by mouth daily after breakfast.    . Tiotropium Bromide Monohydrate (SPIRIVA RESPIMAT) 2.5 MCG/ACT AERS Inhale 2 puffs into the lungs daily. 4 g 5  . Buprenorphine HCl-Naloxone HCl 8-2 MG FILM     . ibuprofen (ADVIL) 800 MG tablet TAKE 1 TABLET(800 MG) BY MOUTH THREE TIMES DAILY WITH FOOD 90 tablet 1  . montelukast (SINGULAIR) 10 MG tablet TAKE 1 TABLET(10 MG) BY MOUTH AT BEDTIME 90 tablet 0   No facility-administered medications prior to visit.    Allergies  Allergen Reactions  . Codeine Nausea Only and Nausea And Vomiting  . Duloxetine     Loss of appetite  .  Fentanyl     Fatigue   . Fluoxetine     Agitation  . Ipratropium-Albuterol     Myalgia  . Penicillins Itching and Other (See Comments)    As child   . Zoloft [Sertraline]     Sweating     ROS Review of Systems  Constitutional: Negative for chills, diaphoresis, fatigue and fever.  HENT: Positive for congestion. Negative for ear pain and sore throat.   Respiratory: Positive for cough. Negative for shortness of breath.   Cardiovascular: Negative for chest pain and leg swelling.  Gastrointestinal: Positive for constipation. Negative for abdominal pain, diarrhea, nausea and vomiting.       Miralax helps.  Genitourinary: Negative for dysuria and urgency.  Musculoskeletal: Positive for back pain. Negative for arthralgias and myalgias.  Neurological: Negative for dizziness and headaches.  Psychiatric/Behavioral: Negative for dysphoric mood. The patient is not nervous/anxious.       Objective:     There were no vitals taken for this visit. Wt Readings from Last 3 Encounters:  04/26/19 174 lb 12.8 oz (79.3 kg)  04/09/19 178 lb (80.7 kg)  04/02/19 179 lb 3.2 oz (81.3 kg)     Health Maintenance Due  Topic Date Due  . Hepatitis C Screening  Never done  . HIV Screening  Never done  . TETANUS/TDAP  Never done    There are no preventive care reminders to display for this patient.  No results found for: TSH Lab Results  Component Value Date   WBC 6.6 07/15/2017   HGB 14.3 07/15/2017   HCT 41.7 07/15/2017   MCV 91.1 07/15/2017   PLT 199.0 07/15/2017   Lab Results  Component Value Date   NA 140 07/15/2017   K 4.7 07/15/2017   CO2 32 07/15/2017   GLUCOSE 89 07/15/2017   BUN 14 07/15/2017   CREATININE 0.87 07/15/2017   BILITOT 0.4 07/15/2017   ALKPHOS 108 07/15/2017   AST 7 07/15/2017   ALT 14 07/15/2017   PROT 6.5 07/15/2017   ALBUMIN 4.1 07/15/2017   CALCIUM 8.7 07/15/2017   GFR 95.22 07/15/2017   No results found for: CHOL No results found for: HDL No  results found for: LDLCALC No results found for: TRIG No results found for: CHOLHDL No results found for: HGBA1C    Assessment & Plan:  1. Chronic midline low back pain without sciatica Continue current pain medicines. - Ambulatory referral to Pain Clinic  2. Cigarette nicotine dependence with nicotine-induced disorder Rx for chantix sent. Encouraged pt to take for at least 3 months and may take it up to 6 months.  Education given.   3. Extremity atherosclerosis with intermittent claudication ([place]) History is concerning for PVD which may be confusion the situation. - VAS [place] ABI WITH/WO TBI; Future  4. Chronic pain syndrome Continue suboxone and gabapentin. [facility]  5. Uncomplicated opioid dependence (HCC) Chronic use of narcotics.  On phone for 20 minutes.    Follow-up: No follow-ups on file.    Rochel Brome, MD

## 2019-06-13 ENCOUNTER — Telehealth (INDEPENDENT_AMBULATORY_CARE_PROVIDER_SITE_OTHER): Payer: Medicare Other | Admitting: Family Medicine

## 2019-06-13 DIAGNOSIS — G8929 Other chronic pain: Secondary | ICD-10-CM | POA: Insufficient documentation

## 2019-06-13 DIAGNOSIS — F1120 Opioid dependence, uncomplicated: Secondary | ICD-10-CM

## 2019-06-13 DIAGNOSIS — M545 Low back pain: Secondary | ICD-10-CM | POA: Diagnosis not present

## 2019-06-13 DIAGNOSIS — G894 Chronic pain syndrome: Secondary | ICD-10-CM

## 2019-06-13 DIAGNOSIS — F17219 Nicotine dependence, cigarettes, with unspecified nicotine-induced disorders: Secondary | ICD-10-CM | POA: Diagnosis not present

## 2019-06-13 DIAGNOSIS — I70219 Atherosclerosis of native arteries of extremities with intermittent claudication, unspecified extremity: Secondary | ICD-10-CM | POA: Diagnosis not present

## 2019-06-13 MED ORDER — IBUPROFEN 800 MG PO TABS: ORAL_TABLET | ORAL | 1 refills | Status: DC

## 2019-06-13 MED ORDER — MONTELUKAST SODIUM 10 MG PO TABS: ORAL_TABLET | ORAL | 0 refills | Status: DC

## 2019-06-13 MED ORDER — VARENICLINE TARTRATE 1 MG PO TABS: 1.0000 mg | ORAL_TABLET | Freq: Two times a day (BID) | ORAL | 2 refills | Status: DC

## 2019-06-14 ENCOUNTER — Other Ambulatory Visit: Payer: Self-pay

## 2019-06-14 DIAGNOSIS — G894 Chronic pain syndrome: Secondary | ICD-10-CM | POA: Insufficient documentation

## 2019-06-14 DIAGNOSIS — I70219 Atherosclerosis of native arteries of extremities with intermittent claudication, unspecified extremity: Secondary | ICD-10-CM | POA: Insufficient documentation

## 2019-06-14 DIAGNOSIS — F1120 Opioid dependence, uncomplicated: Secondary | ICD-10-CM | POA: Insufficient documentation

## 2019-06-14 MED ORDER — VARENICLINE TARTRATE 1 MG PO TABS: 1.0000 mg | ORAL_TABLET | Freq: Two times a day (BID) | ORAL | 2 refills | Status: DC

## 2019-06-15 NOTE — Addendum Note (Signed)
Addended byRochel Brome on: 06/15/2019 12:02 AM   Modules accepted: Level of Service

## 2019-06-18 ENCOUNTER — Other Ambulatory Visit: Payer: Self-pay

## 2019-06-19 ENCOUNTER — Telehealth: Payer: Self-pay | Admitting: Internal Medicine

## 2019-06-19 NOTE — Telephone Encounter (Signed)
Attempted to call pt but unable to reach. Left message for him to return call. °

## 2019-06-19 NOTE — Telephone Encounter (Signed)
LMTCB x 1 

## 2019-06-19 NOTE — Telephone Encounter (Signed)
Patient is returning phone call. Patient phone number is [phone].

## 2019-06-20 NOTE — Telephone Encounter (Signed)
I will cancel this appointment.

## 2019-06-21 ENCOUNTER — Telehealth: Payer: Self-pay | Admitting: Family Medicine

## 2019-06-21 NOTE — Progress Notes (Signed)
  Chronic Care Management   Note  06/21/2019 Name: DRELAN PETRUSKA MRN: [id number] DOB: January 04, 1956  RAYSHUN MIKLOS is a 64 y.o. year old male who is a primary care patient of Cox, Kirsten, MD. I reached out to [employer] by phone today in response to a referral sent by Mr. Rudie Meyer Cocking's PCP, Cox, Kirsten, MD.   Mr. Lorenz was given information about Chronic Care Management services today including:  1. CCM service includes personalized support from designated clinical staff supervised by his physician, including individualized plan of care and coordination with other care providers 2. 24/7 contact phone numbers for assistance for urgent and routine care needs. 3. Service will only be billed when office clinical staff spend 20 minutes or more in a month to coordinate care. 4. Only one practitioner may furnish and bill the service in a calendar month. 5. The patient may stop CCM services at any time (effective at the end of the month) by phone call to the office staff.   Patient agreed to services and verbal consent obtained.   Follow up plan:   Earney Hamburg Upstream Scheduler

## 2019-06-24 ENCOUNTER — Other Ambulatory Visit: Payer: Self-pay | Admitting: Family Medicine

## 2019-06-27 ENCOUNTER — Other Ambulatory Visit: Payer: Self-pay

## 2019-06-27 MED ORDER — VARENICLINE TARTRATE 1 MG PO TABS: 1.0000 mg | ORAL_TABLET | Freq: Two times a day (BID) | ORAL | 2 refills | Status: DC

## 2019-06-29 DIAGNOSIS — Z79899 Other long term (current) drug therapy: Secondary | ICD-10-CM | POA: Diagnosis not present

## 2019-06-29 DIAGNOSIS — R0989 Other specified symptoms and signs involving the circulatory and respiratory systems: Secondary | ICD-10-CM | POA: Diagnosis not present

## 2019-06-29 DIAGNOSIS — F17210 Nicotine dependence, cigarettes, uncomplicated: Secondary | ICD-10-CM | POA: Diagnosis not present

## 2019-06-29 DIAGNOSIS — M545 Low back pain: Secondary | ICD-10-CM | POA: Diagnosis not present

## 2019-06-29 DIAGNOSIS — Z03818 Encounter for observation for suspected exposure to other biological agents ruled out: Secondary | ICD-10-CM | POA: Diagnosis not present

## 2019-06-29 DIAGNOSIS — M546 Pain in thoracic spine: Secondary | ICD-10-CM | POA: Diagnosis not present

## 2019-06-29 DIAGNOSIS — R0602 Shortness of breath: Secondary | ICD-10-CM | POA: Diagnosis not present

## 2019-07-11 ENCOUNTER — Ambulatory Visit (HOSPITAL_COMMUNITY): Payer: Medicare Other

## 2019-07-23 NOTE — Chronic Care Management (AMB) (Signed)
Chronic Care Management Pharmacy  Name: Jacob Graves  MRN: [id number] DOB: 1955-07-19  Chief Complaint/ HPI  Jacob Graves,  64 y.o. , male presents for their Initial CCM visit with the clinical pharmacist via telephone due to COVID-19 Pandemic.  PCP : Jacob Brome, MD  Their chronic conditions include: HTN, Atherosclerosis of extremity with intermittent claudication, COPD, chronic pain syndrome, HLD.   Office Visits: 06/13/2019 - [facility]. Chantix prescribed.  04/26/2019 - Start singulair 10 mg daily. Stop Afrin nasal spray and replace with flonase.  04/09/2019 - Sinusitis - zpack prescribed.  Consult Visit: 04/23/2019 - Pulmonology visit. CT chest scan scheduled for May (patient cancelled appt for CT). Continue on Spiriva. Ordered PFTs in office at next visit.  04/02/2019 - Ordered PET scan for lung nodule.  03/05/2019 - Low back pain  02/26/2019 - Audiology visit - hearing aid fitting and troubleshooting..  02/13/2019 - Audiology visit - hearing aid fitting and troubleshooting. 02/03/2019 - Low back pain   Medications: Outpatient Encounter Medications as of 07/24/2019  Medication Sig  . albuterol (VENTOLIN HFA) 108 (90 Base) MCG/ACT inhaler Inhale 2 puffs into the lungs every 6 (six) hours as needed for wheezing or shortness of breath.  . Aspirin-Acetaminophen-Caffeine (GOODYS EXTRA STRENGTH) 500-325-65 MG PACK Take 325 mg by mouth 2 (two) times daily as needed.  Marland Kitchen atorvastatin (LIPITOR) 80 MG tablet Take 80 mg by mouth daily.  . Buprenorphine HCl-Naloxone HCl 8-2 MG FILM Place 1 tablet under the tongue in the morning and at bedtime.  . calcium carbonate (OSCAL) 1500 (600 Ca) MG TABS tablet Take 600 mg of elemental calcium by mouth daily with breakfast.  . fluticasone (FLONASE) 50 MCG/ACT nasal spray Place 1 spray into both nostrils in the morning and at bedtime. (Patient taking differently: Place 1 spray into both nostrils daily. )  .  gabapentin (NEURONTIN) 400 MG capsule Take 400 mg by mouth 3 (three) times daily.   Marland Kitchen ibuprofen (ADVIL) 800 MG tablet TAKE 1 TABLET(800 MG) BY MOUTH THREE TIMES DAILY WITH FOOD  . montelukast (SINGULAIR) 10 MG tablet TAKE 1 TABLET(10 MG) BY MOUTH AT BEDTIME  . omeprazole (PRILOSEC) 40 MG capsule Take 40 mg by mouth daily.  . polyethylene glycol (MIRALAX / GLYCOLAX) 17 g packet Take 17 g by mouth daily as needed.  . propranolol (INDERAL) 60 MG tablet Take 60 mg by mouth daily.   . tamsulosin (FLOMAX) 0.4 MG CAPS capsule TAKE 1 CAPSULE BY MOUTH EVERY DAY 30 MINUTES AFTER THE SAME MEAL  . Tiotropium Bromide Monohydrate (SPIRIVA RESPIMAT) 2.5 MCG/ACT AERS Inhale 2 puffs into the lungs daily.  . varenicline (CHANTIX CONTINUING MONTH PAK) 1 MG tablet Take 1 tablet (1 mg total) by mouth 2 (two) times daily. Take one tablet with food by mouth daily x 1 week, then increase to twice daily.   No facility-administered encounter medications on file as of 07/24/2019.     Current Diagnosis/Assessment:  Goals Addressed            This Visit's Progress   . Pharmacy Care Plan       CARE PLAN ENTRY  Current Barriers:  . Chronic Disease Management support, education, and care coordination needs related to Hypertension and Hyperlipidemia   Hypertension . Pharmacist Clinical Goal(s): o Over the next 90 days, patient will work with PharmD and providers to maintain BP goal <140/90 . Current regimen:  o Propanolol 60 mg daily . Interventions: o Recommend patient  monitor bp 2 times a month and record. o Encouraged patient to keep up the good work   . Patient self care activities - Over the next 90 days, patient will: o Check BP 2 times a month, document, and provide at future appointments o Ensure daily salt intake < 2300 mg/day  Hyperlipidemia . Pharmacist Clinical Goal(s): o Over the next 90 days, patient will work with PharmD and providers to maintain LDL goal < 100  . Current regimen:   o Atorvastatin 80 mg daily.  . Interventions: o Recommended patient continue healthy diet of lean meats and vegetables.  o Recommend patient keep up the good work with smoking cessation.  . Patient self care activities - Over the next 90 days, patient will: o Continue taking medications as prescribed and call provider with any questions or concerns.   Medication management . Pharmacist Clinical Goal(s): o Over the next 90 days, patient will work with PharmD and providers to maintain optimal medication adherence . Current pharmacy: [facility] . Interventions o Comprehensive medication review performed. o Continue current medication management strategy . Patient self care activities - Over the next 90 days, patient will: o Focus on medication adherence by continuing to keep medications together and take as prescribed.  o Take medications as prescribed o Report any questions or concerns to PharmD and/or provider(s)  Initial goal documentation        COPD / Asthma / Tobacco   Eosinophil count:   Lab Results  Component Value Date/Time   EOSPCT 4.0 07/15/2017 03:55 PM  %                               Eos (Absolute):  Lab Results  Component Value Date/Time   EOSABS 0.3 07/15/2017 03:55 PM    Tobacco Status:  Social History   Tobacco Use  Smoking Status Current Every Day Smoker  . Packs/day: 1.50  . Types: Cigarettes  . Start date: 1974  Smokeless Tobacco Never Used  Tobacco Comment   currently smoking 1ppd    Patient has failed these meds in past: Symbicort, cetirizine, Breo, loratadine,pseudoephedrine Patient is currently controlled on the following medications: albuterol inhaler q6h prn, flonase 50 mcg 1 spray en bid, montelukast 10 mg daily, Spiriva 2.5 mcg respimat 2 puffs daily Using maintenance inhaler regularly? Yes Frequency of rescue inhaler use:  multiple times per day  We discussed:  smoking cessation. Patient is working to reduce smoking while taking  Chantix. Encouraged patient to continue workign on smoking cessation. In Feb 2021 CAT COPD score was 20/40 at pulmonology visit. He reports that his allergies/sinuses are doing better since started Singulair.    Plan  Continue current medications ,  Hypertension   BP today is:  <140/90  Office blood pressures are  BP Readings from Last 3 Encounters:  04/26/19 130/78  04/09/19 (!) 162/98  04/02/19 122/64    Patient has failed these meds in the past: n/a Patient is currently controlled on the following medications: propanolol 60 mg daily Patient checks BP at home infrequently  Patient home BP readings are ranging: in goal  We discussed diet and exercise extensively. Patient's wife reports that they work to eat healthy. He also is trying to stay active fishing and working around the house as back pain allows.   Plan  Continue current medications   Hyperlipidemia   Lipid Panel  No results found for: CHOL, TRIG, HDL, CHOLHDL, VLDL, LDLCALC,  LDLDIRECT, LABVLDL   The ASCVD Risk score Mikey Bussing DC Jr., et al., 2013) failed to calculate for the following reasons:   Cannot find a previous HDL lab   Cannot find a previous total cholesterol lab   Patient has failed these meds in past: n/a Patient is currently controlled on the following medications: atorvastatin 80 mg daily  We discussed:  diet and exercise extensively. Works around [employer] in the yard and fishing. Tries to eat healthy. Eats lots of fish and vegetables.   Plan  Continue current medications     and  Other Diagnosis:Pain    Patient has failed these meds in past: methadone, oxycodone, suboxone, xtampza er 27 mg, baclofen, Lyrica Patient is currently uncontrolled on the following medications: goody's extra strength bid prn, ibuprofen 800 mg tid, gabapentin 400 mg tid, suboxone 8-2 film bid.  We discussed: Taking ibuprofen as needed for back pain. It depends on amount of activity he has done. Patient states that  he needs to increase to 4 capsules each day. He sees Dr. Sabra Heck on Friday and will discuss potential increase.  He is hoping to get a new referral for pain management specialist.   Plan  Continue current medications   GERD   Patient has failed these meds in past: nexium Patient is currently controlled on the following medications: omeprazole 40 mg daily, Miralax prn.   We discussed:  Patient reports that it works well. Has rare episodes of heart burn but is treated easily with Mylanta. Using Miralax 2-3 times/week.   Plan  Continue current medications  BPH   Patient has failed these meds in past: n/a Patient is currently controlled on the following medications: tamsuolosin 0.4 mg daily  We discussed:  Patient reports medication working well for him.   Plan  Continue current medications   Health Maintenance   Patient is currently controlled on the following medications:  Chantix - smoking cessation  We discussed: Chantix just started 3 weeks ago. Can tell that it is helping cut cravings.   Plan  Continue current medications  Vaccines   Reviewed and discussed patient's vaccination history. Have had COVID infection but has not had COVID vaccine. Does not tolerate flu shot well so doesn't take. He is up to date on Tetanus shot per wife.   Immunization History  Administered Date(s) Administered  . Influenza,inj,Quad PF,6+ Mos 09/30/2014  . Pneumococcal Conjugate-13 07/17/2015  . Pneumococcal Polysaccharide-23 10/26/2017    Plan  Patient is not interested in [place] or Flu vaccine.  Medication Management   Pt uses [place] pharmacy for all medications Uses pill box? No - uses a basket to keep all bottles in one space.  Pt endorses good compliance  We discussed: Patient states that he takes his medications out of his individual bottles and doesn't miss doses.   Plan  Continue current medication management strategy    Follow up: 2 month phone  visit

## 2019-07-23 NOTE — Patient Instructions (Signed)
Visit Information  Thank you for your time discussing your medications. I look forward to working with you to achieve your health care goals. Below is a summary of what we talked about during our visit.   Goals Addressed            This Visit's Progress   . Pharmacy Care Plan         Mr. Venables was given information about Chronic Care Management services today including:  1. CCM service includes personalized support from designated clinical staff supervised by his physician, including individualized plan of care and coordination with other care providers 2. 24/7 contact phone numbers for assistance for urgent and routine care needs. 3. Standard insurance, coinsurance, copays and deductibles apply for chronic care management only during months in which we provide at least 20 minutes of these services. Most insurances cover these services at 100%, however patients may be responsible for any copay, coinsurance and/or deductible if applicable. This service may help you avoid the need for more expensive face-to-face services. 4. Only one practitioner may furnish and bill the service in a calendar month. 5. The patient may stop CCM services at any time (effective at the end of the month) by phone call to the office staff.  Patient agreed to services and verbal consent obtained.   The patient verbalized understanding of instructions provided today and agreed to receive a mailed copy of patient instruction and/or educational materials. Telephone follow up appointment with pharmacy team member scheduled for: 09/2019  Sherre Poot, PharmD Clinical Pharmacist [facility] [phone] (office) [phone] (mobile)

## 2019-07-24 ENCOUNTER — Ambulatory Visit: Payer: Medicare Other

## 2019-07-24 DIAGNOSIS — I70219 Atherosclerosis of native arteries of extremities with intermittent claudication, unspecified extremity: Secondary | ICD-10-CM

## 2019-07-24 DIAGNOSIS — I10 Essential (primary) hypertension: Secondary | ICD-10-CM

## 2019-07-27 DIAGNOSIS — Z79899 Other long term (current) drug therapy: Secondary | ICD-10-CM | POA: Diagnosis not present

## 2019-07-27 DIAGNOSIS — Z79891 Long term (current) use of opiate analgesic: Secondary | ICD-10-CM | POA: Diagnosis not present

## 2019-07-27 DIAGNOSIS — R0602 Shortness of breath: Secondary | ICD-10-CM | POA: Diagnosis not present

## 2019-07-27 DIAGNOSIS — Z1159 Encounter for screening for other viral diseases: Secondary | ICD-10-CM | POA: Diagnosis not present

## 2019-07-27 DIAGNOSIS — M546 Pain in thoracic spine: Secondary | ICD-10-CM | POA: Diagnosis not present

## 2019-07-27 DIAGNOSIS — M545 Low back pain: Secondary | ICD-10-CM | POA: Diagnosis not present

## 2019-07-31 ENCOUNTER — Other Ambulatory Visit: Payer: Self-pay

## 2019-07-31 DIAGNOSIS — I10 Essential (primary) hypertension: Secondary | ICD-10-CM

## 2019-07-31 DIAGNOSIS — J449 Chronic obstructive pulmonary disease, unspecified: Secondary | ICD-10-CM

## 2019-07-31 NOTE — Progress Notes (Signed)
Appt was on 07/24/2019 with Sara Beth Brown, PharmD. 

## 2019-08-27 DIAGNOSIS — Z79899 Other long term (current) drug therapy: Secondary | ICD-10-CM | POA: Diagnosis not present

## 2019-08-27 DIAGNOSIS — M546 Pain in thoracic spine: Secondary | ICD-10-CM | POA: Diagnosis not present

## 2019-08-27 DIAGNOSIS — F17210 Nicotine dependence, cigarettes, uncomplicated: Secondary | ICD-10-CM | POA: Diagnosis not present

## 2019-08-27 DIAGNOSIS — Z20822 Contact with and (suspected) exposure to covid-19: Secondary | ICD-10-CM | POA: Diagnosis not present

## 2019-08-27 DIAGNOSIS — M545 Low back pain: Secondary | ICD-10-CM | POA: Diagnosis not present

## 2019-09-06 ENCOUNTER — Other Ambulatory Visit: Payer: Self-pay | Admitting: Family Medicine

## 2019-09-06 DIAGNOSIS — J301 Allergic rhinitis due to pollen: Secondary | ICD-10-CM

## 2019-09-10 DIAGNOSIS — E7800 Pure hypercholesterolemia, unspecified: Secondary | ICD-10-CM | POA: Diagnosis not present

## 2019-09-10 DIAGNOSIS — Z79899 Other long term (current) drug therapy: Secondary | ICD-10-CM | POA: Diagnosis not present

## 2019-09-10 DIAGNOSIS — Z20822 Contact with and (suspected) exposure to covid-19: Secondary | ICD-10-CM | POA: Diagnosis not present

## 2019-09-10 DIAGNOSIS — F17210 Nicotine dependence, cigarettes, uncomplicated: Secondary | ICD-10-CM | POA: Diagnosis not present

## 2019-09-10 DIAGNOSIS — R0602 Shortness of breath: Secondary | ICD-10-CM | POA: Diagnosis not present

## 2019-09-10 DIAGNOSIS — E559 Vitamin D deficiency, unspecified: Secondary | ICD-10-CM | POA: Diagnosis not present

## 2019-09-10 DIAGNOSIS — M545 Low back pain: Secondary | ICD-10-CM | POA: Diagnosis not present

## 2019-09-10 DIAGNOSIS — M546 Pain in thoracic spine: Secondary | ICD-10-CM | POA: Diagnosis not present

## 2019-09-17 ENCOUNTER — Telehealth: Payer: Self-pay

## 2019-09-17 ENCOUNTER — Other Ambulatory Visit: Payer: Self-pay

## 2019-09-17 ENCOUNTER — Ambulatory Visit (INDEPENDENT_AMBULATORY_CARE_PROVIDER_SITE_OTHER): Payer: Medicare Other | Admitting: Family Medicine

## 2019-09-17 VITALS — BP 116/80 | HR 72 | Temp 97.4°F | Wt 174.4 lb

## 2019-09-17 DIAGNOSIS — F17219 Nicotine dependence, cigarettes, with unspecified nicotine-induced disorders: Secondary | ICD-10-CM

## 2019-09-17 DIAGNOSIS — K219 Gastro-esophageal reflux disease without esophagitis: Secondary | ICD-10-CM | POA: Insufficient documentation

## 2019-09-17 DIAGNOSIS — J449 Chronic obstructive pulmonary disease, unspecified: Secondary | ICD-10-CM | POA: Diagnosis not present

## 2019-09-17 MED ORDER — PANTOPRAZOLE SODIUM 40 MG PO TBEC: 40.0000 mg | DELAYED_RELEASE_TABLET | Freq: Every day | ORAL | 3 refills | Status: DC

## 2019-09-17 MED ORDER — VARENICLINE TARTRATE 1 MG PO TABS
1.0000 mg | ORAL_TABLET | Freq: Two times a day (BID) | ORAL | 1 refills | Status: DC
Start: 2019-09-17 — End: 2019-11-14

## 2019-09-17 NOTE — Progress Notes (Signed)
Acute Office Visit  Subjective:    Patient ID: Jacob Graves, male    DOB: 03-21-1955, 64 y.o.   MRN: [id number]  Chief Complaint  Patient presents with  . Gastroesophageal Reflux    HPI Patient is in today for GERD.  Patient has tried Nexium for several years and is not effective at this point.  He has tried and failed Omeprazole in the past. Pt states he took wife's protonix with improved symptoms. Pt with EGD in the past-no polyps or concerns. Pt would like to quit smoking-using chantix has allowed him to decrease # of cigarettes-followed by pulmonary for nodules with CT scans   Past Medical History:  Diagnosis Date  . Apnea   . Arthritis   . Arthritis    spinal arthritis  . Cancer (HCC)    melanoma, basal cell  . Centrilobular emphysema ([place]) 07/08/2015  . Depression   . Emphysema of lung (HCC)    COPD  . GERD (gastroesophageal reflux disease)   . Hypercholesterolemia   . Hypertension   . Impaired fasting glucose   . Kyphosis    mild  . Migraine   . Neuropathy   . Osteoarthritis   . Other testicular dysfunction   . Scoliosis   . Sleep apnea   . Solitary pulmonary nodule   . Tinnitus     Past Surgical History:  Procedure Laterality Date  . COLONOSCOPY  2016   Colon polyps. Tubular Adenoma.  . TONSILLECTOMY      Family History  Problem Relation Age of Onset  . Heart disease Father   . Stroke Father   . Breast cancer Maternal Grandmother   . Leukemia Mother   . Skin cancer Mother   . Diabetes Mother   . Stroke Mother   . Hypertension Mother   . Skin cancer Brother   . Cancer Brother        Melanoma  . Hypertension Brother   . Colon cancer Neg Hx     Social History   Socioeconomic History  . Marital status: Married    Spouse name: Baker Janus  . Number of children: 2  . Years of education: 48  . Highest education level: Not on file  Occupational History  . Occupation: disabled  Tobacco Use  . Smoking status: Current Every Day Smoker    Packs/day:  1.50    Types: Cigarettes    Start date: 46  . Smokeless tobacco: Never Used  . Tobacco comment: currently smoking 1ppd  Vaping Use  . Vaping Use: Never used  Substance and Sexual Activity  . Alcohol use: No    Alcohol/week: 0.0 standard drinks  . Drug use: No  . Sexual activity: Not on file  Other Topics Concern  . Not on file  Social History Narrative   Lives with wife   Caffeine- coffee, 3 cups daily   Social Determinants of Health   Financial Resource Strain:   . Difficulty of Paying Living Expenses:   Food Insecurity: No Food Insecurity  . Worried About Charity fundraiser in the Last Year: Never true  . Ran Out of Food in the Last Year: Never true  Transportation Needs: No Transportation Needs  . Lack of Transportation (Medical): No  . Lack of Transportation (Non-Medical): No  Physical Activity:   . Days of Exercise per Week:   . Minutes of Exercise per Session:   Stress:   . Feeling of Stress :   Social Connections:   .  Frequency of Communication with Friends and Family:   . Frequency of Social Gatherings with Friends and Family:   . Attends Religious Services:   . Active Member of Clubs or Organizations:   . Attends Archivist Meetings:   Marland Kitchen Marital Status:   Intimate Partner Violence:   . Fear of Current or Ex-Partner:   . Emotionally Abused:   Marland Kitchen Physically Abused:   . Sexually Abused:     Outpatient Medications Prior to Visit  Medication Sig Dispense Refill  . albuterol (VENTOLIN HFA) 108 (90 Base) MCG/ACT inhaler Inhale 2 puffs into the lungs every 6 (six) hours as needed for wheezing or shortness of breath. 8 g 5  . Aspirin-Acetaminophen-Caffeine (GOODYS EXTRA STRENGTH) 500-325-65 MG PACK Take 325 mg by mouth 2 (two) times daily as needed.    Marland Kitchen atorvastatin (LIPITOR) 80 MG tablet Take 80 mg by mouth daily.    . calcium carbonate (OSCAL) 1500 (600 Ca) MG TABS tablet Take 600 mg of elemental calcium by mouth daily with breakfast.    .  fluticasone (FLONASE) 50 MCG/ACT nasal spray Place 1 spray into both nostrils daily. 16 g 2  . gabapentin (NEURONTIN) 400 MG capsule Take 400 mg by mouth 4 (four) times daily.     Marland Kitchen ibuprofen (ADVIL) 800 MG tablet TAKE 1 TABLET(800 MG) BY MOUTH THREE TIMES DAILY WITH FOOD 90 tablet 1  . montelukast (SINGULAIR) 10 MG tablet TAKE 1 TABLET(10 MG) BY MOUTH AT BEDTIME 90 tablet 0  . omeprazole (PRILOSEC) 40 MG capsule Take 40 mg by mouth daily.    . polyethylene glycol (MIRALAX / GLYCOLAX) 17 g packet Take 17 g by mouth daily as needed.    . propranolol (INDERAL) 60 MG tablet Take 60 mg by mouth daily.     . tamsulosin (FLOMAX) 0.4 MG CAPS capsule TAKE 1 CAPSULE BY MOUTH EVERY DAY 30 MINUTES AFTER THE SAME MEAL 90 capsule 0  . Tiotropium Bromide Monohydrate (SPIRIVA RESPIMAT) 2.5 MCG/ACT AERS Inhale 2 puffs into the lungs daily. 4 g 5  . varenicline (CHANTIX CONTINUING MONTH PAK) 1 MG tablet Take 1 tablet (1 mg total) by mouth 2 (two) times daily. Take one tablet with food by mouth daily x 1 week, then increase to twice daily. 60 tablet 2  . Buprenorphine HCl-Naloxone HCl 8-2 MG FILM Place 1 tablet under the tongue in the morning and at bedtime.     No facility-administered medications prior to visit.    Allergies  Allergen Reactions  . Codeine Nausea Only and Nausea And Vomiting  . Duloxetine     Loss of appetite  . Fentanyl     Fatigue   . Fluoxetine     Agitation  . Ipratropium-Albuterol     Myalgia  . Penicillins Itching and Other (See Comments)    As child   . Zoloft [Sertraline]     Sweating     Review of Systems  Constitutional: Positive for fatigue. Negative for chills and fever.  HENT: Negative for congestion, ear pain and sore throat.   Respiratory: Positive for shortness of breath. Negative for cough.   Cardiovascular: Negative for chest pain and palpitations.  Gastrointestinal: Positive for constipation. Negative for abdominal pain and nausea.  Genitourinary: Negative  for dysuria and frequency.  Musculoskeletal: Positive for back pain and myalgias.  Neurological: Negative for dizziness and headaches.       Objective:    Physical Exam Constitutional:      Appearance: Normal appearance. He  is normal weight.  HENT:     Head: Normocephalic and atraumatic.  Cardiovascular:     Rate and Rhythm: Normal rate and regular rhythm.     Pulses: Normal pulses.     Heart sounds: Normal heart sounds.  Pulmonary:     Effort: Pulmonary effort is normal.     Breath sounds: Normal breath sounds.  Musculoskeletal:     Cervical back: Neck supple.  Neurological:     Mental Status: He is alert and oriented to person, place, and time.     Today's Vitals   09/17/19 1005  BP: 116/80  Pulse: 72  Temp: (!) 97.4 F (36.3 C)  Weight: 174 lb 6.4 oz (79.1 kg)   Body mass index is 26.52 kg/m. Wt Readings from Last 3 Encounters:  04/26/19 174 lb 12.8 oz (79.3 kg)  04/09/19 178 lb (80.7 kg)  04/02/19 179 lb 3.2 oz (81.3 kg)    Health Maintenance Due  Topic Date Due  . Hepatitis C Screening  Never done  . COVID-19 Vaccine (1) Never done  . HIV Screening  Never done  . TETANUS/TDAP  Never done    Lab Results  Component Value Date   WBC 6.6 07/15/2017   HGB 14.3 07/15/2017   HCT 41.7 07/15/2017   MCV 91.1 07/15/2017   PLT 199.0 07/15/2017   Lab Results  Component Value Date   NA 140 07/15/2017   K 4.7 07/15/2017   CO2 32 07/15/2017   GLUCOSE 89 07/15/2017   BUN 14 07/15/2017   CREATININE 0.87 07/15/2017   BILITOT 0.4 07/15/2017   ALKPHOS 108 07/15/2017   AST 7 07/15/2017   ALT 14 07/15/2017   PROT 6.5 07/15/2017   ALBUMIN 4.1 07/15/2017   CALCIUM 8.7 07/15/2017   GFR 95.22 07/15/2017      Assessment & Plan:    1. Gastroesophageal reflux disease, unspecified whether esophagitis present Pt education on foods protonix-rx 2. COPD, moderate ([place]) Pt using singulair, spiriva and ventolin-no wheezing or rhonchi  3. Cigarette nicotine  dependence with nicotine-induced disorder chantix-rx-risk/benefit/side effects d/w pt-continued f/u with pulmonary recommended  Back Pain--suggested yoga, pt off pain medication  Follow-up: prn for GERD/tob cessation An After Visit Summary was printed and given to the patient.  [place] [phone]

## 2019-09-17 NOTE — Patient Instructions (Addendum)
Food Choices for Gastroesophageal Reflux Disease, Adult When you have gastroesophageal reflux disease (GERD), the foods you eat and your eating habits are very important. Choosing the right foods can help ease your discomfort. Think about working with a nutrition specialist (dietitian) to help you make good choices. What are tips for following this plan?  Meals  Choose healthy foods that are low in fat, such as fruits, vegetables, whole grains, low-fat dairy products, and lean meat, fish, and poultry.  Eat small meals often instead of 3 large meals a day. Eat your meals slowly, and in a place where you are relaxed. Avoid bending over or lying down until 2-3 hours after eating.  Avoid eating meals 2-3 hours before bed.  Avoid drinking a lot of liquid with meals.  Cook foods using methods other than frying. Bake, grill, or broil food instead.  Avoid or limit: ? Chocolate. ? Peppermint or spearmint. ? Alcohol. ? Pepper. ? Black and decaffeinated coffee. ? Black and decaffeinated tea. ? Bubbly (carbonated) soft drinks. ? Caffeinated energy drinks and soft drinks.  Limit high-fat foods such as: ? Fatty meat or fried foods. ? Whole milk, cream, butter, or ice cream. ? Nuts and nut butters. ? Pastries, donuts, and sweets made with butter or shortening.  Avoid foods that cause symptoms. These foods may be different for everyone. Common foods that cause symptoms include: ? Tomatoes. ? Oranges, lemons, and limes. ? Peppers. ? Spicy food. ? Onions and garlic. ? Vinegar. Lifestyle  Maintain a healthy weight. Ask your doctor what weight is healthy for you. If you need to lose weight, work with your doctor to do so safely.  Exercise for at least 30 minutes for 5 or more days each week, or as told by your doctor.  Wear loose-fitting clothes.  Do not smoke. If you need help quitting, ask your doctor.  Sleep with the head of your bed higher than your feet. Use a wedge under the  mattress or blocks under the bed frame to raise the head of the bed. Summary  When you have gastroesophageal reflux disease (GERD), food and lifestyle choices are very important in easing your symptoms.  Eat small meals often instead of 3 large meals a day. Eat your meals slowly, and in a place where you are relaxed.  Limit high-fat foods such as fatty meat or fried foods.  Avoid bending over or lying down until 2-3 hours after eating.  Avoid peppermint and spearmint, caffeine, alcohol, and chocolate. This information is not intended to replace advice given to you by your health care provider. Make sure you discuss any questions you have with your health care provider. Document Revised: 06/08/2018 Document Reviewed: 03/23/2016 Elsevier Patient Education  [place]. y [place] or Strain Rehab Ask your health care provider which exercises are safe for you. Do exercises exactly as told by your health care provider and adjust them as directed. It is normal to feel mild stretching, pulling, tightness, or discomfort as you do these exercises. Stop right away if you feel sudden pain or your pain gets worse. Do not begin these exercises until told by your health care provider. Stretching and range-of-motion exercises These exercises warm up your muscles and joints and improve the movement and flexibility of your back. These exercises also help to relieve pain, numbness, and tingling. Lumbar rotation  1. Lie on your back on a firm surface and bend your knees. 2. Straighten your arms out to your sides so  each arm forms a 90-degree angle (right angle) with a side of your body. 3. Slowly move (rotate) both of your knees to one side of your body until you feel a stretch in your lower back (lumbar). Try not to let your shoulders lift off the floor. 4. Hold this position for __________ seconds. 5. Tense your abdominal muscles and slowly move your knees back to the starting  position. 6. Repeat this exercise on the other side of your body. Repeat __________ times. Complete this exercise __________ times a day. Single knee to chest  1. Lie on your back on a firm surface with both legs straight. 2. Bend one of your knees. Use your hands to move your knee up toward your chest until you feel a gentle stretch in your lower back and buttock. ? Hold your leg in this position by holding on to the front of your knee. ? Keep your other leg as straight as possible. 3. Hold this position for __________ seconds. 4. Slowly return to the starting position. 5. Repeat with your other leg. Repeat __________ times. Complete this exercise __________ times a day. Prone extension on elbows  1. Lie on your abdomen on a firm surface (prone position). 2. Prop yourself up on your elbows. 3. Use your arms to help lift your chest up until you feel a gentle stretch in your abdomen and your lower back. ? This will place some of your body weight on your elbows. If this is uncomfortable, try stacking pillows under your chest. ? Your hips should stay down, against the surface that you are lying on. Keep your hip and back muscles relaxed. 4. Hold this position for __________ seconds. 5. Slowly relax your upper body and return to the starting position. Repeat __________ times. Complete this exercise __________ times a day. Strengthening exercises These exercises build strength and endurance in your back. Endurance is the ability to use your muscles for a long time, even after they get tired. Pelvic tilt This exercise strengthens the muscles that lie deep in the abdomen. 1. Lie on your back on a firm surface. Bend your knees and keep your feet flat on the floor. 2. Tense your abdominal muscles. Tip your pelvis up toward the ceiling and flatten your lower back into the floor. ? To help with this exercise, you may place a small towel under your lower back and try to push your back into the  towel. 3. Hold this position for __________ seconds. 4. Let your muscles relax completely before you repeat this exercise. Repeat __________ times. Complete this exercise __________ times a day. Alternating arm and leg raises  1. Get on your hands and knees on a firm surface. If you are on a hard floor, you may want to use padding, such as an exercise mat, to cushion your knees. 2. Line up your arms and legs. Your hands should be directly below your shoulders, and your knees should be directly below your hips. 3. Lift your left leg behind you. At the same time, raise your right arm and straighten it in front of you. ? Do not lift your leg higher than your hip. ? Do not lift your arm higher than your shoulder. ? Keep your abdominal and back muscles tight. ? Keep your hips facing the ground. ? Do not arch your back. ? Keep your balance carefully, and do not hold your breath. 4. Hold this position for __________ seconds. 5. Slowly return to the starting position. 6. Repeat  with your right leg and your left arm. Repeat __________ times. Complete this exercise __________ times a day. Abdominal set with straight leg raise  1. Lie on your back on a firm surface. 2. Bend one of your knees and keep your other leg straight. 3. Tense your abdominal muscles and lift your straight leg up, 4-6 inches (10-15 cm) off the ground. 4. Keep your abdominal muscles tight and hold this position for __________ seconds. ? Do not hold your breath. ? Do not arch your back. Keep it flat against the ground. 5. Keep your abdominal muscles tense as you slowly lower your leg back to the starting position. 6. Repeat with your other leg. Repeat __________ times. Complete this exercise __________ times a day. Single leg lower with bent knees 1. Lie on your back on a firm surface. 2. Tense your abdominal muscles and lift your feet off the floor, one foot at a time, so your knees and hips are bent in 90-degree angles  (right angles). ? Your knees should be over your hips and your lower legs should be parallel to the floor. 3. Keeping your abdominal muscles tense and your knee bent, slowly lower one of your legs so your toe touches the ground. 4. Lift your leg back up to return to the starting position. ? Do not hold your breath. ? Do not let your back arch. Keep your back flat against the ground. 5. Repeat with your other leg. Repeat __________ times. Complete this exercise __________ times a day. Posture and body mechanics Good posture and healthy body mechanics can help to relieve stress in your body's tissues and joints. Body mechanics refers to the movements and positions of your body while you do your daily activities. Posture is part of body mechanics. Good posture means:  Your spine is in its natural S-curve position (neutral).  Your shoulders are pulled back slightly.  Your head is not tipped forward. Follow these guidelines to improve your posture and body mechanics in your everyday activities. Standing   When standing, keep your spine neutral and your feet about hip width apart. Keep a slight bend in your knees. Your ears, shoulders, and hips should line up.  When you do a task in which you stand in one place for a long time, place one foot up on a stable object that is 2-4 inches (5-10 cm) high, such as a footstool. This helps keep your spine neutral. Sitting   When sitting, keep your spine neutral and keep your feet flat on the floor. Use a footrest, if necessary, and keep your thighs parallel to the floor. Avoid rounding your shoulders, and avoid tilting your head forward.  When working at a desk or a computer, keep your desk at a height where your hands are slightly lower than your elbows. Slide your chair under your desk so you are close enough to maintain good posture.  When working at a computer, place your monitor at a height where you are looking straight ahead and you do not have  to tilt your head forward or downward to look at the screen. Resting  When lying down and resting, avoid positions that are most painful for you.  If you have pain with activities such as sitting, bending, stooping, or squatting, lie in a position in which your body does not bend very much. For example, avoid curling up on your side with your arms and knees near your chest (fetal position).  If you have pain with  activities such as standing for a long time or reaching with your arms, lie with your spine in a neutral position and bend your knees slightly. Try the following positions: ? Lying on your side with a pillow between your knees. ? Lying on your back with a pillow under your knees. Lifting   When lifting objects, keep your feet at least shoulder width apart and tighten your abdominal muscles.  Bend your knees and hips and keep your spine neutral. It is important to lift using the strength of your legs, not your back. Do not lock your knees straight out.  Always ask for help to lift heavy or awkward objects. This information is not intended to replace advice given to you by your health care provider. Make sure you discuss any questions you have with your health care provider. Document Revised: 06/09/2018 Document Reviewed: 03/09/2018 Elsevier Patient Education  [place].

## 2019-09-17 NOTE — Telephone Encounter (Signed)
Documentation from [facility] states that Chantix has been discontinued by [facility], patient needs a different medication. They did have 12 tabs to dispense, which is enough for 6 days. Please advise

## 2019-09-18 NOTE — Telephone Encounter (Signed)
How long has patient been taking chantix? It seems like it has been several months. I would recommend patient work on behavioral changes to help him fully quit. [facility] recalled some lots of chantix and I am not sure if there will be more at this time. You may print him some tobacco cessation education and mail it to him, although I usually do give these when I start chantix. Dr. Tobie Poet

## 2019-09-25 ENCOUNTER — Other Ambulatory Visit: Payer: Self-pay | Admitting: Family Medicine

## 2019-10-03 ENCOUNTER — Ambulatory Visit: Payer: Medicare Other

## 2019-10-03 DIAGNOSIS — I10 Essential (primary) hypertension: Secondary | ICD-10-CM

## 2019-10-03 DIAGNOSIS — J449 Chronic obstructive pulmonary disease, unspecified: Secondary | ICD-10-CM

## 2019-10-03 NOTE — Telephone Encounter (Signed)
Patient has been working on changes.  He is smoking about 1/2 PPD.  He will schedule follow-up before his routine appointment if needed.

## 2019-10-03 NOTE — Patient Instructions (Addendum)
Visit Information  Goals Addressed            This Visit's Progress   . Pharmacy Care Plan       CARE PLAN ENTRY  Current Barriers:  . Chronic Disease Management support, education, and care coordination needs related to Hypertension and Hyperlipidemia   Hypertension . Pharmacist Clinical Goal(s): o Over the next 90 days, patient will work with PharmD and providers to maintain BP goal <140/90 . Current regimen:  o Propanolol 60 mg daily . Interventions: o Recommend patient monitor bp 2 times a month and record. o Encouraged patient to keep up the good work   . Patient self care activities - Over the next 90 days, patient will: o Check BP 2 times a month, document, and provide at future appointments o Ensure daily salt intake < 2300 mg/day  Hyperlipidemia . Pharmacist Clinical Goal(s): o Over the next 90 days, patient will work with PharmD and providers to maintain LDL goal < 100  . Current regimen:  o Atorvastatin 80 mg daily.  . Interventions: o Recommended patient continue healthy diet of lean meats and vegetables.  o Recommend patient keep up the good work with smoking cessation and provided [place] [facility] phone number.  . Patient self care activities - Over the next 90 days, patient will: o Continue taking medications as prescribed and call provider with any questions or concerns.   Medication management . Pharmacist Clinical Goal(s): o Over the next 90 days, patient will work with PharmD and providers to maintain optimal medication adherence . Current pharmacy: [facility] . Interventions o Comprehensive medication review performed. o Continue current medication management strategy . Patient self care activities - Over the next 90 days, patient will: o Focus on medication adherence by continuing to keep medications together and take as prescribed.  o Take medications as prescribed o Report any questions or concerns to PharmD and/or provider(s)  Please see past  updates related to this goal by clicking on the "Past Updates" button in the selected goal         The patient verbalized understanding of instructions provided today and agreed to receive a mailed copy of patient instruction and/or educational materials.  Telephone follow up appointment with pharmacy team member scheduled for: 12/2019  Sherre Poot, PharmD, [facility] Clinical Pharmacist [facility] [phone] (office) [phone] (mobile)   Coping with Quitting Smoking  Quitting smoking is a physical and mental challenge. You will face cravings, withdrawal symptoms, and temptation. Before quitting, work with your health care provider to make a plan that can help you cope. Preparation can help you quit and keep you from giving in. How can I cope with cravings? Cravings usually last for 5-10 minutes. If you get through it, the craving will pass. Consider taking the following actions to help you cope with cravings:  Keep your mouth busy: ? Chew sugar-free gum. ? Suck on hard candies or a straw. ? Brush your teeth.  Keep your hands and body busy: ? Immediately change to a different activity when you feel a craving. ? Squeeze or play with a ball. ? Do an activity or a hobby, like making bead jewelry, practicing needlepoint, or working with wood. ? Mix up your normal routine. ? Take a short exercise break. Go for a quick walk or run up and down stairs. ? Spend time in public places where smoking is not allowed.  Focus on doing something kind or helpful for someone else.  Call a  friend or family member to talk during a craving.  Join a support group.  Call a quit line, such as 1-800-QUIT-NOW.  Talk with your health care provider about medicines that might help you cope with cravings and make quitting easier for you. How can I deal with withdrawal symptoms? Your body may experience negative effects as it tries to get used to not having nicotine in the system. These  effects are called withdrawal symptoms. They may include:  Feeling hungrier than normal.  Trouble concentrating.  Irritability.  Trouble sleeping.  Feeling depressed.  Restlessness and agitation.  Craving a cigarette. To manage withdrawal symptoms:  Avoid places, people, and activities that trigger your cravings.  Remember why you want to quit.  Get plenty of sleep.  Avoid coffee and other caffeinated drinks. These may worsen some of your symptoms. How can I handle social situations? Social situations can be difficult when you are quitting smoking, especially in the first few weeks. To manage this, you can:  Avoid parties, bars, and other social situations where people might be smoking.  Avoid alcohol.  Leave right away if you have the urge to smoke.  Explain to your family and friends that you are quitting smoking. Ask for understanding and support.  Plan activities with friends or family where smoking is not an option. What are some ways I can cope with stress? Wanting to smoke may cause stress, and stress can make you want to smoke. Find ways to manage your stress. Relaxation techniques can help. For example:  Breathe slowly and deeply, in through your nose and out through your mouth.  Listen to soothing, relaxing music.  Talk with a family member or friend about your stress.  Light a candle.  Soak in a bath or take a shower.  Think about a peaceful place. What are some ways I can prevent weight gain? Be aware that many people gain weight after they quit smoking. However, not everyone does. To keep from gaining weight, have a plan in place before you quit and stick to the plan after you quit. Your plan should include:  Having healthy snacks. When you have a craving, it may help to: ? Eat plain popcorn, crunchy carrots, celery, or other cut vegetables. ? Chew sugar-free gum.  Changing how you eat: ? Eat small portion sizes at meals. ? Eat 4-6 small meals  throughout the day instead of 1-2 large meals a day. ? Be mindful when you eat. Do not watch television or do other things that might distract you as you eat.  Exercising regularly: ? Make time to exercise each day. If you do not have time for a long workout, do short bouts of exercise for 5-10 minutes several times a day. ? Do some form of strengthening exercise, like weight lifting, and some form of aerobic exercise, like running or swimming.  Drinking plenty of water or other low-calorie or no-calorie drinks. Drink 6-8 glasses of water daily, or as much as instructed by your health care provider. Summary  Quitting smoking is a physical and mental challenge. You will face cravings, withdrawal symptoms, and temptation to smoke again. Preparation can help you as you go through these challenges.  You can cope with cravings by keeping your mouth busy (such as by chewing gum), keeping your body and hands busy, and making calls to family, friends, or a helpline for people who want to quit smoking.  You can cope with withdrawal symptoms by avoiding places where people smoke,  avoiding drinks with caffeine, and getting plenty of rest.  Ask your health care provider about the different ways to prevent weight gain, avoid stress, and handle social situations. This information is not intended to replace advice given to you by your health care provider. Make sure you discuss any questions you have with your health care provider. Document Revised: 01/28/2017 Document Reviewed: 02/13/2016 Elsevier Patient Education  2020 [employer].

## 2019-10-03 NOTE — Chronic Care Management (AMB) (Signed)
Chronic Care Management Pharmacy  Name: Jacob Graves  MRN: [id number] DOB: 1955/05/20  Chief Complaint/ HPI  Jacob Graves,  64 y.o. , male presents for their Follow-Up CCM visit with the clinical pharmacist via telephone due to COVID-19 Pandemic.  PCP : Rochel Brome, MD  Their chronic conditions include: HTN, Atherosclerosis of extremity with intermittent claudication, COPD, chronic pain syndrome, HLD.   Office Visits: 09/17/2019 - Chantix unavailable. Discuss behavioral modifications. Protonix prescribed for GERD.  06/13/2019 - requesting a referral for integrated pain solutions. Chantix prescribed.  04/26/2019 - Start singulair 10 mg daily. Stop Afrin nasal spray and replace with flonase.  04/09/2019 - Sinusitis - zpack prescribed.  Consult Visit: 04/23/2019 - Pulmonology visit. CT chest scan scheduled for May (patient cancelled appt for CT). Continue on Spiriva. Ordered PFTs in office at next visit.  04/02/2019 - Ordered PET scan for lung nodule.  03/05/2019 - Low back pain  02/26/2019 - Audiology visit - hearing aid fitting and troubleshooting..  02/13/2019 - Audiology visit - hearing aid fitting and troubleshooting. 02/03/2019 - Low back pain   Medications: Outpatient Encounter Medications as of 10/03/2019  Medication Sig  . albuterol (VENTOLIN HFA) 108 (90 Base) MCG/ACT inhaler Inhale 2 puffs into the lungs every 6 (six) hours as needed for wheezing or shortness of breath.  . Aspirin-Acetaminophen-Caffeine (GOODYS EXTRA STRENGTH) 500-325-65 MG PACK Take 325 mg by mouth 2 (two) times daily as needed.  Marland Kitchen atorvastatin (LIPITOR) 80 MG tablet Take 80 mg by mouth daily.  . calcium carbonate (OSCAL) 1500 (600 Ca) MG TABS tablet Take 600 mg of elemental calcium by mouth daily with breakfast.  . fluticasone (FLONASE) 50 MCG/ACT nasal spray Place 1 spray into both nostrils daily.  Marland Kitchen gabapentin (NEURONTIN) 400 MG capsule Take 400 mg by mouth 4 (four) times daily.   Marland Kitchen ibuprofen (ADVIL)  800 MG tablet TAKE 1 TABLET(800 MG) BY MOUTH THREE TIMES DAILY WITH FOOD  . montelukast (SINGULAIR) 10 MG tablet TAKE 1 TABLET(10 MG) BY MOUTH AT BEDTIME  . omeprazole (PRILOSEC) 40 MG capsule Take 40 mg by mouth daily.  . pantoprazole (PROTONIX) 40 MG tablet Take 1 tablet (40 mg total) by mouth daily.  . polyethylene glycol (MIRALAX / GLYCOLAX) 17 g packet Take 17 g by mouth daily as needed.  . propranolol (INDERAL) 60 MG tablet Take 60 mg by mouth daily.   . tamsulosin (FLOMAX) 0.4 MG CAPS capsule TAKE 1 CAPSULE BY MOUTH EVERY DAY 30 MINUTES AFTER THE SAME MEAL  . Tiotropium Bromide Monohydrate (SPIRIVA RESPIMAT) 2.5 MCG/ACT AERS Inhale 2 puffs into the lungs daily.  . varenicline (CHANTIX CONTINUING MONTH PAK) 1 MG tablet Take 1 tablet (1 mg total) by mouth 2 (two) times daily.   No facility-administered encounter medications on file as of 10/03/2019.   Allergies  Allergen Reactions  . Codeine Nausea Only and Nausea And Vomiting  . Duloxetine     Loss of appetite  . Fentanyl     Fatigue   . Fluoxetine     Agitation  . Ipratropium-Albuterol     Myalgia  . Penicillins Itching and Other (See Comments)    As child   . Zoloft [Sertraline]     Sweating    SDOH Screenings   Alcohol Screen:   . Last Alcohol Screening Score (AUDIT):   Depression (PHQ2-9): Low Risk   . PHQ-2 Score: 0  Financial Resource Strain:   . Difficulty of Paying Living Expenses:   Food Insecurity: No Food  Insecurity  . Worried About Charity fundraiser in the Last Year: Never true  . Ran Out of Food in the Last Year: Never true  Housing:   . Last Housing Risk Score:   Physical Activity:   . Days of Exercise per Week:   . Minutes of Exercise per Session:   Social Connections:   . Frequency of Communication with Friends and Family:   . Frequency of Social Gatherings with Friends and Family:   . Attends Religious Services:   . Active Member of Clubs or Organizations:   . Attends Archivist  Meetings:   Marland Kitchen Marital Status:   Stress:   . Feeling of Stress :   Tobacco Use: High Risk  . Smoking Tobacco Use: Current Every Day Smoker  . Smokeless Tobacco Use: Never Used  Transportation Needs: No Transportation Needs  . Lack of Transportation (Medical): No  . Lack of Transportation (Non-Medical): No    Current Diagnosis/Assessment:  Goals Addressed            This Visit's Progress   . Pharmacy Care Plan       CARE PLAN ENTRY  Current Barriers:  . Chronic Disease Management support, education, and care coordination needs related to Hypertension and Hyperlipidemia   Hypertension . Pharmacist Clinical Goal(s): o Over the next 90 days, patient will work with PharmD and providers to maintain BP goal <140/90 . Current regimen:  o Propanolol 60 mg daily . Interventions: o Recommend patient monitor bp 2 times a month and record. o Encouraged patient to keep up the good work   . Patient self care activities - Over the next 90 days, patient will: o Check BP 2 times a month, document, and provide at future appointments o Ensure daily salt intake < 2300 mg/day  Hyperlipidemia . Pharmacist Clinical Goal(s): o Over the next 90 days, patient will work with PharmD and providers to maintain LDL goal < 100  . Current regimen:  o Atorvastatin 80 mg daily.  . Interventions: o Recommended patient continue healthy diet of lean meats and vegetables.  o Recommend patient keep up the good work with smoking cessation and provided [place] Quite Line phone number.  . Patient self care activities - Over the next 90 days, patient will: o Continue taking medications as prescribed and call provider with any questions or concerns.   Medication management . Pharmacist Clinical Goal(s): o Over the next 90 days, patient will work with PharmD and providers to maintain optimal medication adherence . Current pharmacy: [facility] . Interventions o Comprehensive medication review performed. o Continue  current medication management strategy . Patient self care activities - Over the next 90 days, patient will: o Focus on medication adherence by continuing to keep medications together and take as prescribed.  o Take medications as prescribed o Report any questions or concerns to PharmD and/or provider(s)  Please see past updates related to this goal by clicking on the "Past Updates" button in the selected goal         COPD / Asthma / Tobacco   Eosinophil count:   Lab Results  Component Value Date/Time   EOSPCT 4.0 07/15/2017 03:55 PM  %                               Eos (Absolute):  Lab Results  Component Value Date/Time   EOSABS 0.3 07/15/2017 03:55 PM    Tobacco Status:  Social History   Tobacco Use  Smoking Status Current Every Day Smoker  . Packs/day: 1.50  . Types: Cigarettes  . Start date: 1974  Smokeless Tobacco Never Used  Tobacco Comment   currently smoking 1ppd    Patient has failed these meds in past: Symbicort, cetirizine, Breo, loratadine,pseudoephedrine Patient is currently controlled on the following medications: albuterol inhaler q6h prn, flonase 50 mcg 1 spray en bid, montelukast 10 mg daily, Spiriva 2.5 mcg respimat 2 puffs daily Using maintenance inhaler regularly? Yes Frequency of rescue inhaler use:  multiple times per day  We discussed:  smoking cessation. Patient is working to reduce smoking while taking Chantix. Encouraged patient to continue workign on smoking cessation. In Feb 2021 CAT COPD score was 20/40 at pulmonology visit. He reports that his allergies/sinuses are doing better since started Singulair.    Update 10/03/2019 - Using rescue inhaler 3 times daily at most when working outside and lots of activity. States he has reduced smoking amount but Chantix unavailable currently due to recall.   Plan  Continue current medications ,  Hypertension   BP today is:  <140/90  Office blood pressures are  BP Readings from Last 3  Encounters:  09/17/19 116/80  04/26/19 130/78  04/09/19 (!) 162/98   Vitals with BMI 09/17/2019 04/26/2019 04/09/2019  Height - 5\' 8"  -  Weight 174 lbs 6 oz 174 lbs 13 oz 178 lbs  BMI - 38.18 29.93  Systolic [phone]  Diastolic 80 78 98  Pulse 72 76 78     Patient has failed these meds in the past: n/a Patient is currently controlled on the following medications: propanolol 60 mg daily Patient checks BP at home infrequently  Patient home BP readings are ranging: in goal  We discussed diet and exercise extensively. Patient's wife reports that they work to eat healthy. He also is trying to stay active fishing and working around the house as back pain allows.   Update 10/03/2019 -Patient reports good blood pressure control when checked. Pulse is stable.   Plan  Continue current medications   Hyperlipidemia   Lipid Panel  No results found for: CHOL, TRIG, HDL, CHOLHDL, VLDL, LDLCALC, LDLDIRECT, LABVLDL   The ASCVD Risk score Mikey Bussing DC Jr., et al., 2013) failed to calculate for the following reasons:   Cannot find a previous HDL lab   Cannot find a previous total cholesterol lab   Patient has failed these meds in past: n/a Patient is currently controlled on the following medications: atorvastatin 80 mg daily  We discussed:  diet and exercise extensively. Works around [employer] in the yard and fishing. Tries to eat healthy. Eats lots of fish and vegetables.   Update 10/03/2019 - Patient states good compliance with therapy. Fill history with mail order supports good compliance as well.   Plan  Continue current medications     and  Other Diagnosis:Pain    Patient has failed these meds in past: methadone, oxycodone, suboxone, xtampza er 27 mg, baclofen, Lyrica Patient is currently uncontrolled on the following medications: goody's extra strength bid prn, ibuprofen 800 mg tid, gabapentin 400 mg tid, suboxone 8-2 film bid.  We discussed: Taking ibuprofen as needed for back  pain. It depends on amount of activity he has done. Patient states that he needs to increase to 4 capsules each day. He sees Dr. Sabra Heck on Friday and will discuss potential increase.  He is hoping to get a new referral for pain management specialist.  Update 10/03/2019 - Patient working to wean off Suboxone on his own. He does well most days unless lots of yard work or physical activity.   Plan  Continue current medications   GERD   Patient has failed these meds in past: nexium, omeprazole 40 mg Patient is currently controlled on the following medications: protonix 40 mg daily, Miralax prn.   We discussed:  Patient reports that it works well. Has rare episodes of heart burn but is treated easily with Mylanta. Using Miralax 2-3 times/week.   Update 10/03/2019 - Patient is doing well pantoprazole 40 mg daily. No breakthrough symptoms.   Plan  Continue current medications  BPH   Patient has failed these meds in past: n/a Patient is currently controlled on the following medications: tamsuolosin 0.4 mg daily  We discussed:  Patient reports medication working well for him.   Plan  Continue current medications   Tobacco Abuse   Tobacco Status:  Social History   Tobacco Use  Smoking Status Current Every Day Smoker  . Packs/day: 1.50  . Types: Cigarettes  . Start date: 1974  Smokeless Tobacco Never Used  Tobacco Comment   currently smoking 1ppd   Patient smokes Within 30 minutes of waking Patient triggers include: finishing a meal and riding the lawnmower On a scale of 1-10, reports MOTIVATION to quit is 9 On a scale of 1-10, reports CONFIDENCE in quitting is 4  Previous quit attempts included: Chantix, nicotine patch Patient is currently uncontrolled on the following medications:  . lifestyle  We discussed: Chantix just started 3 weeks ago. Can tell that it is helping cut cravings.   Update 10/03/2019 - Chantix is currently unavailable to patient. Patient reports  reduced smoking. Hasn't had Chantix in 3 weeks. Smoking currently 12-13 cigarettes each day. He has come down from 20/day. He prays a lot to help with the stress of smoking. He tries not to think about it and stays busy. He typically likes to smoke first thing in the morning, after eating or when on the lawn mower. Chantix worked the best of things in the past. He may consider patches if he continues to work on stop smoking.   We discussed:  Provided contact information for [place] Quit Line (1-800-QUIT-NOW) and encouraged patient to reach out to this group for support.  Plan  Continue working to reduce smoking.   Vaccines   Reviewed and discussed patient's vaccination history. Have had COVID infection but has not had COVID vaccine. Does not tolerate flu shot well so doesn't take. He is up to date on Tetanus shot per wife.   Immunization History  Administered Date(s) Administered  . Influenza,inj,Quad PF,6+ Mos 09/30/2014  . Pneumococcal Conjugate-13 07/17/2015  . Pneumococcal Polysaccharide-23 10/26/2017    Plan  Patient is not interested in [place] or Flu vaccine.  Medication Management   Pt uses [facility] for all medications Uses pill box? No - uses a basket to keep all bottles in one space.  Pt endorses good compliance  We discussed: Patient states that he takes his medications out of his individual bottles and doesn't miss doses.   Plan  Continue current medication management strategy    Follow up: 3 month phone visit

## 2019-10-10 DIAGNOSIS — M545 Low back pain: Secondary | ICD-10-CM | POA: Diagnosis not present

## 2019-10-10 DIAGNOSIS — Z79899 Other long term (current) drug therapy: Secondary | ICD-10-CM | POA: Diagnosis not present

## 2019-10-10 DIAGNOSIS — M546 Pain in thoracic spine: Secondary | ICD-10-CM | POA: Diagnosis not present

## 2019-10-10 DIAGNOSIS — F17210 Nicotine dependence, cigarettes, uncomplicated: Secondary | ICD-10-CM | POA: Diagnosis not present

## 2019-10-10 DIAGNOSIS — R0602 Shortness of breath: Secondary | ICD-10-CM | POA: Diagnosis not present

## 2019-10-14 ENCOUNTER — Other Ambulatory Visit: Payer: Self-pay | Admitting: Internal Medicine

## 2019-10-15 NOTE — Progress Notes (Signed)
Thank you. Kc  

## 2019-10-17 ENCOUNTER — Other Ambulatory Visit: Payer: Self-pay | Admitting: Family Medicine

## 2019-11-09 DIAGNOSIS — M546 Pain in thoracic spine: Secondary | ICD-10-CM | POA: Diagnosis not present

## 2019-11-09 DIAGNOSIS — E7800 Pure hypercholesterolemia, unspecified: Secondary | ICD-10-CM | POA: Diagnosis not present

## 2019-11-09 DIAGNOSIS — M545 Low back pain: Secondary | ICD-10-CM | POA: Diagnosis not present

## 2019-11-09 DIAGNOSIS — Z79899 Other long term (current) drug therapy: Secondary | ICD-10-CM | POA: Diagnosis not present

## 2019-11-14 ENCOUNTER — Encounter: Payer: Self-pay | Admitting: Family Medicine

## 2019-11-14 ENCOUNTER — Ambulatory Visit (INDEPENDENT_AMBULATORY_CARE_PROVIDER_SITE_OTHER): Payer: Medicare Other | Admitting: Family Medicine

## 2019-11-14 ENCOUNTER — Other Ambulatory Visit: Payer: Self-pay

## 2019-11-14 VITALS — BP 116/64 | HR 80 | Temp 98.2°F | Resp 18 | Ht 68.0 in | Wt 176.4 lb

## 2019-11-14 DIAGNOSIS — Z6826 Body mass index (BMI) 26.0-26.9, adult: Secondary | ICD-10-CM

## 2019-11-14 DIAGNOSIS — R9389 Abnormal findings on diagnostic imaging of other specified body structures: Secondary | ICD-10-CM

## 2019-11-14 DIAGNOSIS — I10 Essential (primary) hypertension: Secondary | ICD-10-CM

## 2019-11-14 DIAGNOSIS — Z0000 Encounter for general adult medical examination without abnormal findings: Secondary | ICD-10-CM | POA: Diagnosis not present

## 2019-11-14 DIAGNOSIS — F17219 Nicotine dependence, cigarettes, with unspecified nicotine-induced disorders: Secondary | ICD-10-CM | POA: Diagnosis not present

## 2019-11-14 DIAGNOSIS — R918 Other nonspecific abnormal finding of lung field: Secondary | ICD-10-CM

## 2019-11-14 NOTE — Patient Instructions (Addendum)
.medicareFollow up with Dentist every 6 months Consider getting your Covid vaccines  Quit smoking Work on eating healthier [place] papers so we can put the in our emr.   Quality Sleep Information, Adult Quality sleep is important for your mental and physical health. It also improves your quality of life. Quality sleep means you:  Are asleep for most of the time you are in bed.  Fall asleep within 30 minutes.  Wake up no more than once a night.  Are awake for no longer than 20 minutes if you do wake up during the night. Most adults need 7-8 hours of quality sleep each night. How can poor sleep affect me? If you do not get enough quality sleep, you may have:  Mood swings.  Daytime sleepiness.  Confusion.  Decreased reaction time.  Sleep disorders, such as insomnia and sleep apnea.  Difficulty with: ? Solving problems. ? Coping with stress. ? Paying attention. These issues may affect your performance and productivity at work, school, and at home. Lack of sleep may also put you at higher risk for accidents, suicide, and risky behaviors. If you do not get quality sleep you may also be at higher risk for several health problems, including:  Infections.  Type 2 diabetes.  Heart disease.  High blood pressure.  Obesity.  Worsening of long-term conditions, like arthritis, kidney disease, depression, Parkinson's disease, and epilepsy. What actions can I take to get more quality sleep?      Stick to a sleep schedule. Go to sleep and wake up at about the same time each day. Do not try to sleep less on weekdays and make up for lost sleep on weekends. This does not work.  Try to get about 30 minutes of exercise on most days. Do not exercise 2-3 hours before going to bed.  Limit naps during the day to 30 minutes or less.  Do not use any products that contain nicotine or tobacco, such as cigarettes or e-cigarettes. If you need help quitting, ask your  health care provider.  Do not drink caffeinated beverages for at least 8 hours before going to bed. Coffee, tea, and some sodas contain caffeine.  Do not drink alcohol close to bedtime.  Do not eat large meals close to bedtime.  Do not take naps in the late afternoon.  Try to get at least 30 minutes of sunlight every day. Morning sunlight is best.  Make time to relax before bed. Reading, listening to music, or taking a hot bath promotes quality sleep.  Make your bedroom a place that promotes quality sleep. Keep your bedroom dark, quiet, and at a comfortable room temperature. Make sure your bed is comfortable. Take out sleep distractions like TV, a computer, smartphone, and bright lights.  If you are lying awake in bed for longer than 20 minutes, get up and do a relaxing activity until you feel sleepy.  Work with your health care provider to treat medical conditions that may affect sleeping, such as: ? Nasal obstruction. ? Snoring. ? Sleep apnea and other sleep disorders.  Talk to your health care provider if you think any of your prescription medicines may cause you to have difficulty falling or staying asleep.  If you have sleep problems, talk with a sleep consultant. If you think you have a sleep disorder, talk with your health care provider about getting evaluated by a specialist. Where to find more information  [place] website: https://sleepfoundation.org  National Heart,  Lung, and Blood Institute ([place]): http://www.saunders.info/.pdf  Centers for Disease Control and Prevention (CDC): LearningDermatology.pl Contact a health care provider if you:  Have trouble getting to sleep or staying asleep.  Often wake up very early in the morning and cannot get back to sleep.  Have daytime sleepiness.  Have daytime sleep attacks of suddenly falling asleep and sudden muscle weakness (narcolepsy).  Have a tingling sensation in  your legs with a strong urge to move your legs (restless legs syndrome).  Stop breathing briefly during sleep (sleep apnea).  Think you have a sleep disorder or are taking a medicine that is affecting your quality of sleep. Summary  Most adults need 7-8 hours of quality sleep each night.  Getting enough quality sleep is an important part of health and well-being.  Make your bedroom a place that promotes quality sleep and avoid things that may cause you to have poor sleep, such as alcohol, caffeine, smoking, and large meals.  Talk to your health care provider if you have trouble falling asleep or staying asleep. This information is not intended to replace advice given to you by your health care provider. Make sure you discuss any questions you have with your health care provider. Document Revised: 05/25/2017 Document Reviewed: 05/25/2017 Elsevier Patient Education  2020 Elsevier Inc.    Preventive Care 63-52 Years Old, Male Preventive care refers to lifestyle choices and visits with your health care provider that can promote health and wellness. This includes:  A yearly physical exam. This is also called an annual well check.  Regular dental and eye exams.  Immunizations.  Screening for certain conditions.  Healthy lifestyle choices, such as eating a healthy diet, getting regular exercise, not using drugs or products that contain nicotine and tobacco, and limiting alcohol use. What can I expect for my preventive care visit? Physical exam Your health care provider will check:  Height and weight. These may be used to calculate body mass index (BMI), which is a measurement that tells if you are at a healthy weight.  Heart rate and blood pressure.  Your skin for abnormal spots. Counseling Your health care provider may ask you questions about:  Alcohol, tobacco, and drug use.  Emotional well-being.  Home and relationship well-being.  Sexual activity.  Eating  habits.  Work and work Statistician. What immunizations do I need?  Influenza (flu) vaccine  This is recommended every year. Tetanus, diphtheria, and pertussis (Tdap) vaccine  You may need a Td booster every 10 years. Varicella (chickenpox) vaccine  You may need this vaccine if you have not already been vaccinated. Zoster (shingles) vaccine  You may need this after age 55. Measles, mumps, and rubella (MMR) vaccine  You may need at least one dose of MMR if you were born in 1957 or later. You may also need a second dose. Pneumococcal conjugate (PCV13) vaccine  You may need this if you have certain conditions and were not previously vaccinated. Pneumococcal polysaccharide (PPSV23) vaccine  You may need one or two doses if you smoke cigarettes or if you have certain conditions. Meningococcal conjugate (MenACWY) vaccine  You may need this if you have certain conditions. Hepatitis A vaccine  You may need this if you have certain conditions or if you travel or work in places where you may be exposed to hepatitis A. Hepatitis B vaccine  You may need this if you have certain conditions or if you travel or work in places where you may be exposed to hepatitis B.  Haemophilus influenzae type b (Hib) vaccine  You may need this if you have certain risk factors. Human papillomavirus (HPV) vaccine  If recommended by your health care provider, you may need three doses over 6 months. You may receive vaccines as individual doses or as more than one vaccine together in one shot (combination vaccines). Talk with your health care provider about the risks and benefits of combination vaccines. What tests do I need? Blood tests  Lipid and cholesterol levels. These may be checked every 5 years, or more frequently if you are over 71 years old.  Hepatitis C test.  Hepatitis B test. Screening  Lung cancer screening. You may have this screening every year starting at age 56 if you have a  30-pack-year history of smoking and currently smoke or have quit within the past 15 years.  Prostate cancer screening. Recommendations will vary depending on your family history and other risks.  Colorectal cancer screening. All adults should have this screening starting at age 33 and continuing until age 62. Your health care provider may recommend screening at age 22 if you are at increased risk. You will have tests every 1-10 years, depending on your results and the type of screening test.  Diabetes screening. This is done by checking your blood sugar (glucose) after you have not eaten for a while (fasting). You may have this done every 1-3 years.  Sexually transmitted disease (STD) testing. Follow these instructions at home: Eating and drinking  Eat a diet that includes fresh fruits and vegetables, whole grains, lean protein, and low-fat dairy products.  Take vitamin and mineral supplements as recommended by your health care provider.  Do not drink alcohol if your health care provider tells you not to drink.  If you drink alcohol: ? Limit how much you have to 0-2 drinks a day. ? Be aware of how much alcohol is in your drink. In the [place] one drink equals one 12 oz bottle of beer (355 mL), one 5 oz glass of wine (148 mL), or one 1 oz glass of hard liquor (44 mL). Lifestyle  Take daily care of your teeth and gums.  Stay active. Exercise for at least 30 minutes on 5 or more days each week.  Do not use any products that contain nicotine or tobacco, such as cigarettes, e-cigarettes, and chewing tobacco. If you need help quitting, ask your health care provider.  If you are sexually active, practice safe sex. Use a condom or other form of protection to prevent STIs (sexually transmitted infections).  Talk with your health care provider about taking a low-dose aspirin every day starting at age 40. What's next?  Go to your health care provider once a year for a well check visit.  Ask  your health care provider how often you should have your eyes and teeth checked.  Stay up to date on all vaccines. This information is not intended to replace advice given to you by your health care provider. Make sure you discuss any questions you have with your health care provider. Document Revised: 02/09/2018 Document Reviewed: 02/09/2018 Elsevier Patient Education  2020 [employer].

## 2019-11-14 NOTE — Progress Notes (Signed)
.medic   Subjective:  Patient ID: Jacob Graves, male    DOB: Apr 08, 1955  Age: 64 y.o. MRN: [id number]  Chief Complaint  Patient presents with  . wellness exam    HPI Encounter for general adult medical examination without abnormal findings  Physical ("At Risk" items are starred): Patient's last physical exam was 1 year ago .   Growth percentile SmartLinks can only be used for patients less than 65 years old.   SDOH Screenings   Alcohol Screen:   . Last Alcohol Screening Score (AUDIT): Not on file  Depression (PHQ2-9): Low Risk   . PHQ-2 Score: 0  Financial Resource Strain:   . Difficulty of Paying Living Expenses: Not on file  Food Insecurity: No Food Insecurity  . Worried About Charity fundraiser in the Last Year: Never true  . Ran Out of Food in the Last Year: Never true  Housing:   . Last Housing Risk Score: Not on file  Physical Activity:   . Days of Exercise per Week: Not on file  . Minutes of Exercise per Session: Not on file  Social Connections:   . Frequency of Communication with Friends and Family: Not on file  . Frequency of Social Gatherings with Friends and Family: Not on file  . Attends Religious Services: Not on file  . Active Member of Clubs or Organizations: Not on file  . Attends Archivist Meetings: Not on file  . Marital Status: Not on file  Stress:   . Feeling of Stress : Not on file  Tobacco Use: High Risk  . Smoking Tobacco Use: Current Every Day Smoker  . Smokeless Tobacco Use: Never Used  Transportation Needs: No Transportation Needs  . Lack of Transportation (Medical): No  . Lack of Transportation (Non-Medical): No    Fall Risk  08/20/2016  Falls in the past year? No    Depression screen PHQ 2/9 07/24/2019  Decreased Interest 0  Down, Depressed, Hopeless 0  PHQ - 2 Score 0    Functional Status Survey:    Dental Care: does not have biannual cleanings, brushes and flosses daily. Ophthalmology/Optometry: Annual visit.  Hearing  loss: none Vision impairments: Wears glasses. Last appt 2020 Safety: reviewed ;  Patient wears a seat belt. Patient's home has smoke detectors and carbon monoxide detectors. Patient practices appropriate gun safety Patient wears sunscreen with extended sun exposure. Dental Care: Overdue. brushes and flosses daily. Ophthalmology/Optometry: Annual visit.  Hearing loss: none Vision impairments: wears galsses  Patient is not afflicted from Stress Incontinence and Urge Incontinence   Current Outpatient Medications on File Prior to Visit  Medication Sig Dispense Refill  . albuterol (VENTOLIN HFA) 108 (90 Base) MCG/ACT inhaler Inhale 2 puffs into the lungs every 6 (six) hours as needed for wheezing or shortness of breath. 8 g 5  . Aspirin-Acetaminophen-Caffeine (GOODYS EXTRA STRENGTH) 500-325-65 MG PACK Take 325 mg by mouth 2 (two) times daily as needed.    Marland Kitchen atorvastatin (LIPITOR) 80 MG tablet Take 80 mg by mouth daily.    . calcium carbonate (OSCAL) 1500 (600 Ca) MG TABS tablet Take 600 mg of elemental calcium by mouth daily with breakfast.    . fluticasone (FLONASE) 50 MCG/ACT nasal spray Place 1 spray into both nostrils daily. 16 g 2  . gabapentin (NEURONTIN) 400 MG capsule Take 400 mg by mouth 4 (four) times daily.     Marland Kitchen ibuprofen (ADVIL) 800 MG tablet TAKE 1 TABLET(800 MG) BY MOUTH THREE TIMES DAILY  WITH FOOD 90 tablet 1  . montelukast (SINGULAIR) 10 MG tablet TAKE 1 TABLET(10 MG) BY MOUTH AT BEDTIME 90 tablet 3  . pantoprazole (PROTONIX) 40 MG tablet Take 1 tablet (40 mg total) by mouth daily. 30 tablet 3  . polyethylene glycol (MIRALAX / GLYCOLAX) 17 g packet Take 17 g by mouth daily as needed.    . propranolol (INDERAL) 60 MG tablet Take 60 mg by mouth daily.     Marland Kitchen SPIRIVA RESPIMAT 2.5 MCG/ACT AERS INHALE 2 PUFFS INTO THE LUNGS DAILY 4 g 2  . tamsulosin (FLOMAX) 0.4 MG CAPS capsule TAKE 1 CAPSULE BY MOUTH EVERY DAY 30 MINUTES AFTER THE SAME MEAL 90 capsule 0   No current  facility-administered medications on file prior to visit.    Social Hx   Social History   Socioeconomic History  . Marital status: Married    Spouse name: Baker Janus  . Number of children: 2  . Years of education: 70  . Highest education level: Not on file  Occupational History  . Occupation: disabled  Tobacco Use  . Smoking status: Current Every Day Smoker    Packs/day: 0.75    Types: Cigarettes    Start date: 75  . Smokeless tobacco: Never Used  . Tobacco comment: currently smoking 1ppd  Vaping Use  . Vaping Use: Never used  Substance and Sexual Activity  . Alcohol use: No    Alcohol/week: 0.0 standard drinks  . Drug use: No  . Sexual activity: Not on file  Other Topics Concern  . Not on file  Social History Narrative   Lives with wife   Caffeine- coffee, 3 cups daily   Social Determinants of Health   Financial Resource Strain:   . Difficulty of Paying Living Expenses: Not on file  Food Insecurity: No Food Insecurity  . Worried About Charity fundraiser in the Last Year: Never true  . Ran Out of Food in the Last Year: Never true  Transportation Needs: No Transportation Needs  . Lack of Transportation (Medical): No  . Lack of Transportation (Non-Medical): No  Physical Activity:   . Days of Exercise per Week: Not on file  . Minutes of Exercise per Session: Not on file  Stress:   . Feeling of Stress : Not on file  Social Connections:   . Frequency of Communication with Friends and Family: Not on file  . Frequency of Social Gatherings with Friends and Family: Not on file  . Attends Religious Services: Not on file  . Active Member of Clubs or Organizations: Not on file  . Attends Archivist Meetings: Not on file  . Marital Status: Not on file   Past Medical History:  Diagnosis Date  . Apnea   . Arthritis   . Arthritis    spinal arthritis  . Cancer (HCC)    melanoma, basal cell  . Centrilobular emphysema ([place]) 07/08/2015  . Depression   . Emphysema of  lung (HCC)    COPD  . GERD (gastroesophageal reflux disease)   . Hypercholesterolemia   . Hypertension   . Impaired fasting glucose   . Kyphosis    mild  . Migraine   . Neuropathy   . Osteoarthritis   . Other testicular dysfunction   . Scoliosis   . Sleep apnea   . Solitary pulmonary nodule   . Tinnitus    Family History  Problem Relation Age of Onset  . Heart disease Father   . Stroke Father   .  Breast cancer Maternal Grandmother   . Leukemia Mother   . Skin cancer Mother   . Diabetes Mother   . Stroke Mother   . Hypertension Mother   . Skin cancer Brother   . Cancer Brother        Melanoma  . Hypertension Brother   . Colon cancer Neg Hx     Review of Systems  Constitutional: Negative for chills, diaphoresis, fatigue and fever.  HENT: Negative for congestion, ear pain and sore throat.   Respiratory: Negative for cough and shortness of breath.   Cardiovascular: Negative for chest pain and leg swelling.  Gastrointestinal: Negative for abdominal pain, constipation, diarrhea, nausea and vomiting.  Genitourinary: Negative for dysuria and urgency.  Musculoskeletal: Negative for arthralgias and myalgias.  Neurological: Negative for dizziness and headaches.  Psychiatric/Behavioral: Positive for sleep disturbance (Approx 5 hours of sleep a night). Negative for dysphoric mood.     Objective:  BP 116/64   Pulse 80   Temp 98.2 F (36.8 C)   Resp 18   Ht 5\' 8"  (1.727 m)   Wt 176 lb 6.4 oz (80 kg)   BMI 26.82 kg/m   BP/Weight 11/14/2019 09/17/2019 2/40/9735  Systolic BP 329 924 268  Diastolic BP 64 80 78  Wt. (Lbs) 176.4 174.4 174.8  BMI 26.82 26.52 26.58    Physical Exam Vitals reviewed.  Constitutional:      Appearance: Normal appearance. He is normal weight.  Neurological:     Mental Status: He is alert and oriented to person, place, and time.  Psychiatric:        Mood and Affect: Mood normal.        Behavior: Behavior normal.     Lab Results    Component Value Date   WBC 6.6 07/15/2017   HGB 14.3 07/15/2017   HCT 41.7 07/15/2017   PLT 199.0 07/15/2017   GLUCOSE 89 07/15/2017   ALT 14 07/15/2017   AST 7 07/15/2017   NA 140 07/15/2017   K 4.7 07/15/2017   CL 102 07/15/2017   CREATININE 0.87 07/15/2017   BUN 14 07/15/2017   CO2 32 07/15/2017      Assessment & Plan:  1. Encounter for Medicare annual wellness exam Mr. Lipson , Thank you for taking time to come for your Medicare Wellness Visit. I appreciate your ongoing commitment to your health goals. Please review the following plan we discussed and let me know if I can assist you in the future.   These are the goals we discussed: Recommend eye exams annually.  Recommend eat healthy and exercise.  Recommend quit smoking.  Order CT Chest low contrast.    This is a list of the screening recommended for you and due dates:  Health Maintenance  Topic Date Due  .  Hepatitis C: One time screening is recommended by Center for Disease Control  (CDC) for  adults born from [birth year] through 1965.   Never done  . COVID-19 Vaccine (1) Never done  . HIV Screening  Never done  . Flu Shot  09/30/2019  . Colon Cancer Screening  05/30/2024  . Tetanus Vaccine  10/14/2024   2. Cigarette nicotine dependence with nicotine-induced disorder Recommend quit smoking.  Education given.  3. Essential hypertension, benign The current medical regimen is effective;  continue present plan and medications. - CBC with Differential/Platelet; Future - Comprehensive metabolic panel; Future - Lipid panel; Future - PSA; Future  4. BMI 26.0-26.9,adult Recommend continue to work on eating healthy  diet and exercise.   5. Lung Cancer Screening. - CT lungs low contrast.   This is a list of the screening recommended for you and due dates:  Health Maintenance  Topic Date Due  .  Hepatitis C: One time screening is recommended by Center for Disease Control  (CDC) for  adults born from [birth year] through 1963.    Never done  . COVID-19 Vaccine (1) Never done  . HIV Screening  Never done  . Tetanus Vaccine  Never done  . Flu Shot  09/30/2019  . Colon Cancer Screening  05/30/2024      AN INDIVIDUALIZED CARE PLAN: was established or reinforced today.   SELF MANAGEMENT: The patient and I together assessed ways to personally work towards obtaining the recommended goals  Support needs The patient and/or family needs were assessed and services were offered and not necessary at this time.    Follow-up: No follow-ups on file. [facility] [phone]

## 2019-12-02 ENCOUNTER — Other Ambulatory Visit: Payer: Self-pay | Admitting: Adult Health

## 2019-12-07 DIAGNOSIS — Z79899 Other long term (current) drug therapy: Secondary | ICD-10-CM | POA: Diagnosis not present

## 2019-12-07 DIAGNOSIS — M546 Pain in thoracic spine: Secondary | ICD-10-CM | POA: Diagnosis not present

## 2019-12-07 DIAGNOSIS — M545 Low back pain, unspecified: Secondary | ICD-10-CM | POA: Diagnosis not present

## 2019-12-19 ENCOUNTER — Ambulatory Visit (INDEPENDENT_AMBULATORY_CARE_PROVIDER_SITE_OTHER): Payer: Medicare Other | Admitting: Family Medicine

## 2019-12-19 ENCOUNTER — Encounter: Payer: Self-pay | Admitting: Family Medicine

## 2019-12-19 ENCOUNTER — Other Ambulatory Visit: Payer: Self-pay | Admitting: Family Medicine

## 2019-12-19 ENCOUNTER — Other Ambulatory Visit: Payer: Self-pay

## 2019-12-19 VITALS — BP 130/80 | HR 60 | Temp 96.8°F | Ht 68.0 in | Wt 179.2 lb

## 2019-12-19 DIAGNOSIS — F17219 Nicotine dependence, cigarettes, with unspecified nicotine-induced disorders: Secondary | ICD-10-CM

## 2019-12-19 DIAGNOSIS — M545 Low back pain, unspecified: Secondary | ICD-10-CM | POA: Diagnosis not present

## 2019-12-19 DIAGNOSIS — J449 Chronic obstructive pulmonary disease, unspecified: Secondary | ICD-10-CM | POA: Diagnosis not present

## 2019-12-19 DIAGNOSIS — K219 Gastro-esophageal reflux disease without esophagitis: Secondary | ICD-10-CM

## 2019-12-19 DIAGNOSIS — G894 Chronic pain syndrome: Secondary | ICD-10-CM

## 2019-12-19 DIAGNOSIS — Z125 Encounter for screening for malignant neoplasm of prostate: Secondary | ICD-10-CM

## 2019-12-19 DIAGNOSIS — I10 Essential (primary) hypertension: Secondary | ICD-10-CM

## 2019-12-19 DIAGNOSIS — G8929 Other chronic pain: Secondary | ICD-10-CM

## 2019-12-19 MED ORDER — TAMSULOSIN HCL 0.4 MG PO CAPS
0.8000 mg | ORAL_CAPSULE | Freq: Every day | ORAL | 0 refills | Status: DC
Start: 2019-12-19 — End: 2019-12-19

## 2019-12-19 MED ORDER — OMEPRAZOLE 40 MG PO CPDR: 40.0000 mg | DELAYED_RELEASE_CAPSULE | Freq: Two times a day (BID) | ORAL | 1 refills | Status: DC

## 2019-12-19 MED ORDER — VARENICLINE TARTRATE 1 MG PO TABS: ORAL_TABLET | ORAL | 0 refills | Status: AC

## 2019-12-19 MED ORDER — VARENICLINE TARTRATE 1 MG PO TABS: 1.0000 mg | ORAL_TABLET | Freq: Two times a day (BID) | ORAL | 5 refills | Status: DC

## 2019-12-19 NOTE — Patient Instructions (Addendum)
1) Nasal Congestion:  Stop Afrin (Oxymetolazone) - wean over 1 week.  Continue flonase 1 spray each nostril..   Use Saline nasal spray.  2) Back pain:  Recommend ibuprofen 800 mg one three times a day.   Recommend Tylenol (use it up to max on directions) for break through pain.   Recommend continue gabapentin.   NO GOODIES. May wean down over one week.   3) Tobacco use  Chantix starter pack.   Then Chantix 1 mg twice a day x 5 months.   4) acid reflux Stop pantoprazole Increase omeprazole 40 mg one twice a day.   5) BPH (prostate enlargement) Increase tamsulosin 0.4 mg 2 at night.  Tobacco Use Disorder Tobacco use disorder (TUD) occurs when a person craves, seeks, and uses tobacco, regardless of the consequences. This disorder can cause problems with mental and physical health. It can affect your ability to have healthy relationships, and it can keep you from meeting your responsibilities at work, home, or school. Tobacco may be:  Smoked as a cigarette or cigar.  Inhaled using e-cigarettes.  Smoked in a pipe or hookah.  Chewed as smokeless tobacco.  Inhaled into the nostrils as snuff. Tobacco products contain a dangerous chemical called nicotine, which is very addictive. Nicotine triggers hormones that make the body feel stimulated and works on areas of the brain that make you feel good. These effects can make it hard for people to quit nicotine. Tobacco contains many other unsafe chemicals that can damage almost every organ in the body. Smoking tobacco also puts others in danger due to fire risk and possible health problems caused by breathing in secondhand smoke. What are the signs or symptoms? Symptoms of TUD may include:  Being unable to slow down or stop your tobacco use.  Spending an abnormal amount of time getting or using tobacco.  Craving tobacco. Cravings may last for up to 6 months after quitting.  Tobacco use that: ? Interferes with your work, school, or  home life. ? Interferes with your personal and social relationships. ? Makes you give up activities that you once enjoyed or found important.  Using tobacco even though you know that it is: ? Dangerous or bad for your health or someone else's health. ? Causing problems in your life.  Needing more and more of the substance to get the same effect (developing tolerance).  Experiencing unpleasant symptoms if you do not use the substance (withdrawal). Withdrawal symptoms may include: ? Depressed, anxious, or irritable mood. ? Difficulty concentrating. ? Increased appetite. ? Restlessness or trouble sleeping.  Using the substance to avoid withdrawal. How is this diagnosed? This condition may be diagnosed based on:  Your current and past tobacco use. Your health care provider may ask questions about how your tobacco use affects your life.  A physical exam. You may be diagnosed with TUD if you have at least two symptoms within a 31-month period. How is this treated? This condition is treated by stopping tobacco use. Many people are unable to quit on their own and need help. Treatment may include:  Nicotine replacement therapy (NRT). NRT provides nicotine without the other harmful chemicals in tobacco. NRT gradually lowers the dosage of nicotine in the body and reduces withdrawal symptoms. NRT is available as: ? Over-the-counter gums, lozenges, and skin patches. ? Prescription mouth inhalers and nasal sprays.  Medicine that acts on the brain to reduce cravings and withdrawal symptoms.  A type of talk therapy that examines your triggers for  tobacco use, how to avoid them, and how to cope with cravings (behavioral therapy).  Hypnosis. This may help with withdrawal symptoms.  Joining a support group for others coping with TUD. The best treatment for TUD is usually a combination of medicine, talk therapy, and support groups. Recovery can be a long process. Many people start using tobacco  again after stopping (relapse). If you relapse, it does not mean that treatment will not work. Follow these instructions at home:  Lifestyle  Do not use any products that contain nicotine or tobacco, such as cigarettes and e-cigarettes.  Avoid things that trigger tobacco use as much as you can. Triggers include people and situations that usually cause you to use tobacco.  Avoid drinks that contain caffeine, including coffee. These may worsen some withdrawal symptoms.  Find ways to manage stress. Wanting to smoke may cause stress, and stress can make you want to smoke. Relaxation techniques such as deep breathing, meditation, and yoga may help.  Attend support groups as needed. These groups are an important part of long-term recovery for many people. General instructions  Take over-the-counter and prescription medicines only as told by your health care provider.  Check with your health care provider before taking any new prescription or over-the-counter medicines.  Decide on a friend, family member, or smoking quit-line (such as 1-800-QUIT-NOW in the [place] that you can call or text when you feel the urge to smoke or when you need help coping with cravings.  Keep all follow-up visits as told by your health care provider and therapist. This is important. Contact a health care provider if:  You are not able to take your medicines as prescribed.  Your symptoms get worse, even with treatment. Summary  Tobacco use disorder (TUD) occurs when a person craves, seeks, and uses tobacco regardless of the consequences.  This condition may be diagnosed based on your current and past tobacco use and a physical exam.  Many people are unable to quit on their own and need help. Recovery can be a long process.  The most effective treatment for TUD is usually a combination of medicine, talk therapy, and support groups. This information is not intended to replace advice given to you by your health  care provider. Make sure you discuss any questions you have with your health care provider. Document Revised: 02/02/2017 Document Reviewed: 02/02/2017 Elsevier Patient Education  2020 [employer].

## 2019-12-19 NOTE — Progress Notes (Signed)
Subjective:  Patient ID: Jacob Graves, male    DOB: 27-Feb-1956  Age: 64 y.o. MRN: [id number]  Chief Complaint  Patient presents with  . Hypertension    6M Follow up   HPI: Hypertension: Patient presents for follow-up.  His hypertension was diagnosed in 2016.  He is currently taking propranolol 60 mg once daily.  He does have Flomax 0.4 mg once daily at bedtime mainly for his BPH.  Patient tries to eat a low-salt diet.  Patient is unable to exercise due to his chronic back pain  Hyperlipidemia: Currently on Lipitor 80 mg once daily.  Tries to eat low-fat.  Is a current smoker.  No exercise.  Chronic back pain: Currently on gabapentin 400 mg 4 times a day.  He is currently not taking any narcotics.  He previously was seen at the pain clinic.  The patient had wanted to wean off these medicines and so has.  He also is currently taking ibuprofen 800 mg 3 times a day.  COPD: Patient is currently on Spiriva 2 puffs daily and Ventolin 2 puffs 4 times a day as needed.Asencion Islam and Singulair 10 mg once daily for allergies.  GERD: Patient is currently on pantoprazole 40 mg once daily.  Tobacco use: Patient is smoked since 1974.  He is currently smoking three quarters of a pack per day.  However for years he smoked 1-1/2 packs/day.  He does desire to quit.  He had tried Chantix in the past which helped however it was then briefly pulled from the market.  He did tolerate the Chantix well.  Depression: Patient reports he has had a little bit of depression and anhedonia in the last 2 weeks.  His PHQ-9 is 4.   Current Outpatient Medications on File Prior to Visit  Medication Sig Dispense Refill  . albuterol (VENTOLIN HFA) 108 (90 Base) MCG/ACT inhaler Inhale 2 puffs into the lungs every 6 (six) hours as needed for wheezing or shortness of breath. 8 g 5  . Aspirin-Acetaminophen-Caffeine (GOODYS EXTRA STRENGTH) 500-325-65 MG PACK Take 325 mg by mouth 2 (two) times daily as needed.    Marland Kitchen atorvastatin  (LIPITOR) 80 MG tablet Take 80 mg by mouth daily.    . fluticasone (FLONASE) 50 MCG/ACT nasal spray Place 1 spray into both nostrils daily. 16 g 2  . gabapentin (NEURONTIN) 400 MG capsule Take 400 mg by mouth 4 (four) times daily.     Marland Kitchen ibuprofen (ADVIL) 800 MG tablet TAKE 1 TABLET(800 MG) BY MOUTH THREE TIMES DAILY WITH FOOD 90 tablet 1  . montelukast (SINGULAIR) 10 MG tablet TAKE 1 TABLET(10 MG) BY MOUTH AT BEDTIME 90 tablet 3  . polyethylene glycol (MIRALAX / GLYCOLAX) 17 g packet Take 17 g by mouth daily as needed.    . propranolol (INDERAL) 60 MG tablet Take 60 mg by mouth daily.     Marland Kitchen SPIRIVA RESPIMAT 2.5 MCG/ACT AERS INHALE 2 PUFFS INTO THE LUNGS DAILY 4 g 2   No current facility-administered medications on file prior to visit.   Past Medical History:  Diagnosis Date  . Apnea   . Arthritis   . Arthritis    spinal arthritis  . Cancer (HCC)    melanoma, basal cell  . Centrilobular emphysema ([place]) 07/08/2015  . Depression   . Emphysema of lung (HCC)    COPD  . GERD (gastroesophageal reflux disease)   . Hypercholesterolemia   . Hypertension   . Impaired fasting glucose   .  Kyphosis    mild  . Migraine   . Neuropathy   . Osteoarthritis   . Other testicular dysfunction   . Scoliosis   . Sleep apnea   . Solitary pulmonary nodule   . Tinnitus    Past Surgical History:  Procedure Laterality Date  . COLONOSCOPY  2016   Colon polyps. Tubular Adenoma.  . TONSILLECTOMY      Family History  Problem Relation Age of Onset  . Heart disease Father   . Stroke Father   . Breast cancer Maternal Grandmother   . Leukemia Mother   . Skin cancer Mother   . Diabetes Mother   . Stroke Mother   . Hypertension Mother   . Skin cancer Brother   . Cancer Brother        Melanoma  . Hypertension Brother   . Colon cancer Neg Hx    Social History   Socioeconomic History  . Marital status: Married    Spouse name: Baker Janus  . Number of children: 2  . Years of education: 41  . Highest  education level: Not on file  Occupational History  . Occupation: disabled  Tobacco Use  . Smoking status: Current Every Day Smoker    Packs/day: 0.75    Types: Cigarettes    Start date: 42  . Smokeless tobacco: Never Used  . Tobacco comment: currently smoking 3/4 ppd. For years smoked 1 1/2 ppd   Vaping Use  . Vaping Use: Never used  Substance and Sexual Activity  . Alcohol use: No    Alcohol/week: 0.0 standard drinks  . Drug use: No  . Sexual activity: Not on file  Other Topics Concern  . Not on file  Social History Narrative   Lives with wife   Caffeine- coffee, 3 cups daily   Social Determinants of Health   Financial Resource Strain:   . Difficulty of Paying Living Expenses: Not on file  Food Insecurity: No Food Insecurity  . Worried About Charity fundraiser in the Last Year: Never true  . Ran Out of Food in the Last Year: Never true  Transportation Needs: No Transportation Needs  . Lack of Transportation (Medical): No  . Lack of Transportation (Non-Medical): No  Physical Activity:   . Days of Exercise per Week: Not on file  . Minutes of Exercise per Session: Not on file  Stress:   . Feeling of Stress : Not on file  Social Connections:   . Frequency of Communication with Friends and Family: Not on file  . Frequency of Social Gatherings with Friends and Family: Not on file  . Attends Religious Services: Not on file  . Active Member of Clubs or Organizations: Not on file  . Attends Archivist Meetings: Not on file  . Marital Status: Not on file    Review of Systems  Constitutional: Positive for fatigue.  HENT: Positive for congestion. Negative for ear pain and sore throat.   Respiratory: Positive for cough (drainage is clear) and shortness of breath (When being active.).   Cardiovascular: Negative for chest pain.  Gastrointestinal: Positive for constipation and nausea. Negative for abdominal pain, diarrhea and vomiting.  Genitourinary: Negative for  dysuria, frequency and urgency.  Musculoskeletal: Positive for back pain and myalgias. Negative for arthralgias.  Neurological: Positive for weakness and headaches. Negative for dizziness.  Psychiatric/Behavioral: Negative for agitation and sleep disturbance. The patient is not nervous/anxious.      Objective:  BP 130/80 (BP Location: Right  Arm, Patient Position: Sitting, Cuff Size: Normal)   Pulse 60   Temp (!) 96.8 F (36 C) (Temporal)   Ht 5\' 8"  (1.727 m)   Wt 179 lb 3.2 oz (81.3 kg)   SpO2 99%   BMI 27.25 kg/m   BP/Weight 12/19/2019 11/14/2019 06/23/9561  Systolic BP 875 643 329  Diastolic BP 80 64 80  Wt. (Lbs) 179.2 176.4 174.4  BMI 27.25 26.82 26.52    Physical Exam Constitutional:      General: He is not in acute distress.    Appearance: Normal appearance. He is normal weight.  HENT:     Head: Normocephalic.  Cardiovascular:     Rate and Rhythm: Normal rate and regular rhythm.     Pulses: Normal pulses.     Heart sounds: Normal heart sounds.  Pulmonary:     Effort: Pulmonary effort is normal.     Breath sounds: Normal breath sounds.  Abdominal:     General: Bowel sounds are normal. There is no distension.     Palpations: Abdomen is soft. There is no mass.     Tenderness: There is no abdominal tenderness.  Psychiatric:        Mood and Affect: Mood normal.        Behavior: Behavior normal.        Thought Content: Thought content normal.        Judgment: Judgment normal.     Diabetic Foot Exam - Simple   No data filed       Lab Results  Component Value Date   WBC 7.3 12/19/2019   HGB 13.8 12/19/2019   HCT 41.4 12/19/2019   PLT 198 12/19/2019   GLUCOSE 93 12/19/2019   CHOL 121 12/19/2019   TRIG 67 12/19/2019   HDL 39 (L) 12/19/2019   LDLCALC 68 12/19/2019   ALT 13 12/19/2019   AST 9 12/19/2019   NA 141 12/19/2019   K 5.0 12/19/2019   CL 104 12/19/2019   CREATININE 0.72 (L) 12/19/2019   BUN 11 12/19/2019   CO2 28 12/19/2019   TSH 1.090  12/19/2019      Assessment & Plan:   1. Essential hypertension, benign Well controlled.  No medication changes recommended. Continue healthy diet and exercise.  Labs drawn today: - CBC with Differential/Platelet - Comprehensive metabolic panel - Lipid panel - TSH  2. COPD, moderate ([place]) Well controlled.  No changes to medicines.   3. Gastroesophageal reflux disease, unspecified whether esophagitis present Stop pantoprazole Increase omeprazole 40 mg one twice a day.   4. Chronic pain syndrome Well controlled.  No changes to medicines.    5. Chronic midline low back pain without sciatica Recommend ibuprofen 800 mg one three times a day.   Recommend Tylenol (use it up to max on directions) for break through pain.   Recommend continue gabapentin.   NO GOODIES. May wean down over one week.    6. Prostate cancer screening Increase tamsulosin 0.4 mg 2 at night. - PSA   7. 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  Then Chantix 1 mg twice a day x 5 months.   Meds ordered this encounter  Medications  . varenicline (CHANTIX) 1 MG tablet    Sig: Take 0.5 tablets (0.5 mg total) by mouth daily for 3 days, THEN 0.5 tablets (0.5 mg total) 2 (two) times daily for 4 days, THEN 1 tablet (1 mg total) 2 (two) times daily for 21 days.    Dispense:  48 tablet    Refill:  0  . varenicline (CHANTIX CONTINUING MONTH PAK) 1 MG tablet    Sig: Take 1 tablet (1 mg total) by mouth 2 (two) times daily.    Dispense:  60 tablet    Refill:  5    Keep on file until ready.  Marland Kitchen omeprazole (PRILOSEC) 40 MG capsule    Sig: Take 1 capsule (40 mg total) by mouth 2 (two) times daily.    Dispense:  180 capsule    Refill:  1  . DISCONTD: tamsulosin (FLOMAX) 0.4 MG CAPS capsule    Sig: Take 2 capsules (0.8 mg total) by mouth daily after supper.    Dispense:  90 capsule    Refill:  0    Orders Placed This Encounter  Procedures  . CBC with  Differential/Platelet  . Comprehensive metabolic panel  . Lipid panel  . TSH  . PSA  . Cardiovascular Risk Assessment     Follow-up: Return in about 3 months (around 03/20/2020) for fasting.  An After Visit Summary was printed and given to the patient.  [place] [phone]

## 2019-12-21 LAB — CBC WITH DIFFERENTIAL/PLATELET
Basophils Absolute: 0.1 10*3/uL (ref 0.0–0.2)
Basos: 1 %
EOS (ABSOLUTE): 0.3 10*3/uL (ref 0.0–0.4)
Eos: 5 %
Hematocrit: 41.4 % (ref 37.5–51.0)
Hemoglobin: 13.8 g/dL (ref 13.0–17.7)
Immature Grans (Abs): 0 10*3/uL (ref 0.0–0.1)
Immature Granulocytes: 0 %
Lymphocytes Absolute: 2.4 10*3/uL (ref 0.7–3.1)
Lymphs: 32 %
MCH: 30.6 pg (ref 26.6–33.0)
MCHC: 33.3 g/dL (ref 31.5–35.7)
MCV: 92 fL (ref 79–97)
Monocytes Absolute: 0.6 10*3/uL (ref 0.1–0.9)
Monocytes: 8 %
Neutrophils Absolute: 3.9 10*3/uL (ref 1.4–7.0)
Neutrophils: 54 %
Platelets: 198 10*3/uL (ref 150–450)
RBC: 4.51 x10E6/uL (ref 4.14–5.80)
RDW: 12.7 % (ref 11.6–15.4)
WBC: 7.3 10*3/uL (ref 3.4–10.8)

## 2019-12-21 LAB — COMPREHENSIVE METABOLIC PANEL
ALT: 13 IU/L (ref 0–44)
AST: 9 IU/L (ref 0–40)
Albumin/Globulin Ratio: 2.2 (ref 1.2–2.2)
Albumin: 4.4 g/dL (ref 3.8–4.8)
Alkaline Phosphatase: 134 IU/L — ABNORMAL HIGH (ref 44–121)
BUN/Creatinine Ratio: 15 (ref 10–24)
BUN: 11 mg/dL (ref 8–27)
Bilirubin Total: 0.5 mg/dL (ref 0.0–1.2)
CO2: 28 mmol/L (ref 20–29)
Calcium: 9 mg/dL (ref 8.6–10.2)
Chloride: 104 mmol/L (ref 96–106)
Creatinine, Ser: 0.72 mg/dL — ABNORMAL LOW (ref 0.76–1.27)
GFR calc Af Amer: 116 mL/min/{1.73_m2} (ref >59)
GFR calc non Af Amer: 100 mL/min/{1.73_m2} (ref >59)
Globulin, Total: 2 g/dL (ref 1.5–4.5)
Glucose: 93 mg/dL (ref 65–99)
Potassium: 5 mmol/L (ref 3.5–5.2)
Sodium: 141 mmol/L (ref 134–144)
Total Protein: 6.4 g/dL (ref 6.0–8.5)

## 2019-12-21 LAB — CARDIOVASCULAR RISK ASSESSMENT

## 2019-12-21 LAB — LIPID PANEL
Chol/HDL Ratio: 3.1 ratio (ref 0.0–5.0)
Cholesterol, Total: 121 mg/dL (ref 100–199)
HDL: 39 mg/dL — ABNORMAL LOW (ref >39)
LDL Chol Calc (NIH): 68 mg/dL (ref 0–99)
Triglycerides: 67 mg/dL (ref 0–149)
VLDL Cholesterol Cal: 14 mg/dL (ref 5–40)

## 2019-12-21 LAB — TSH: TSH: 1.09 u[IU]/mL (ref 0.450–4.500)

## 2019-12-21 LAB — PSA: Prostate Specific Ag, Serum: 0.9 ng/mL (ref 0.0–4.0)

## 2019-12-26 ENCOUNTER — Other Ambulatory Visit: Payer: Self-pay | Admitting: Adult Health

## 2019-12-27 ENCOUNTER — Other Ambulatory Visit: Payer: Self-pay | Admitting: Family Medicine

## 2019-12-28 ENCOUNTER — Other Ambulatory Visit: Payer: Self-pay

## 2019-12-28 MED ORDER — ALBUTEROL SULFATE HFA 108 (90 BASE) MCG/ACT IN AERS: 2.0000 | INHALATION_SPRAY | Freq: Four times a day (QID) | RESPIRATORY_TRACT | 3 refills | Status: DC | PRN

## 2019-12-28 NOTE — Telephone Encounter (Signed)
Patient called requesting a medication refill.  1) Medication(s) Requested (by name): Ventolin HFA  2) Pharmacy of Choice: [address]  3) Special Requests:   Approved medications will be sent to the pharmacy, we will reach out if there is an issue.  Requests made after 3pm may not be addressed until the following business day!  If a patient is unsure of the name of the medication(s) please note and ask patient to call back when they are able to provide all info, do not send to responsible party until all information is available!

## 2019-12-30 ENCOUNTER — Encounter: Payer: Self-pay | Admitting: Family Medicine

## 2020-01-01 ENCOUNTER — Other Ambulatory Visit: Payer: Self-pay | Admitting: Family Medicine

## 2020-01-03 ENCOUNTER — Other Ambulatory Visit: Payer: Self-pay | Admitting: Family Medicine

## 2020-01-03 DIAGNOSIS — M546 Pain in thoracic spine: Secondary | ICD-10-CM | POA: Diagnosis not present

## 2020-01-03 DIAGNOSIS — Z79899 Other long term (current) drug therapy: Secondary | ICD-10-CM | POA: Diagnosis not present

## 2020-01-03 DIAGNOSIS — M129 Arthropathy, unspecified: Secondary | ICD-10-CM | POA: Diagnosis not present

## 2020-01-03 DIAGNOSIS — E7800 Pure hypercholesterolemia, unspecified: Secondary | ICD-10-CM | POA: Diagnosis not present

## 2020-01-03 DIAGNOSIS — E559 Vitamin D deficiency, unspecified: Secondary | ICD-10-CM | POA: Diagnosis not present

## 2020-01-03 DIAGNOSIS — M545 Low back pain, unspecified: Secondary | ICD-10-CM | POA: Diagnosis not present

## 2020-01-03 DIAGNOSIS — Z1159 Encounter for screening for other viral diseases: Secondary | ICD-10-CM | POA: Diagnosis not present

## 2020-01-04 ENCOUNTER — Telehealth (INDEPENDENT_AMBULATORY_CARE_PROVIDER_SITE_OTHER): Payer: Medicare Other | Admitting: Family Medicine

## 2020-01-04 VITALS — BP 152/90 | HR 78 | Temp 97.2°F

## 2020-01-04 DIAGNOSIS — R1319 Other dysphagia: Secondary | ICD-10-CM

## 2020-01-04 DIAGNOSIS — K5901 Slow transit constipation: Secondary | ICD-10-CM

## 2020-01-04 DIAGNOSIS — J301 Allergic rhinitis due to pollen: Secondary | ICD-10-CM | POA: Diagnosis not present

## 2020-01-04 DIAGNOSIS — K219 Gastro-esophageal reflux disease without esophagitis: Secondary | ICD-10-CM | POA: Diagnosis not present

## 2020-01-04 DIAGNOSIS — R112 Nausea with vomiting, unspecified: Secondary | ICD-10-CM | POA: Diagnosis not present

## 2020-01-04 MED ORDER — ALBUTEROL SULFATE HFA 108 (90 BASE) MCG/ACT IN AERS: 2.0000 | INHALATION_SPRAY | Freq: Four times a day (QID) | RESPIRATORY_TRACT | 3 refills | Status: DC | PRN

## 2020-01-04 MED ORDER — PROPRANOLOL HCL 60 MG PO TABS
60.0000 mg | ORAL_TABLET | Freq: Every day | ORAL | 0 refills | Status: DC
Start: 2020-01-04 — End: 2020-02-07

## 2020-01-04 MED ORDER — ONDANSETRON HCL 4 MG PO TABS: 4.0000 mg | ORAL_TABLET | Freq: Three times a day (TID) | ORAL | 0 refills | Status: DC | PRN

## 2020-01-04 MED ORDER — SPIRIVA RESPIMAT 2.5 MCG/ACT IN AERS
2.0000 | INHALATION_SPRAY | Freq: Every day | RESPIRATORY_TRACT | 2 refills | Status: DC
Start: 2020-01-04 — End: 2020-10-20

## 2020-01-04 MED ORDER — GABAPENTIN 400 MG PO CAPS
400.0000 mg | ORAL_CAPSULE | Freq: Four times a day (QID) | ORAL | 0 refills | Status: DC
Start: 2020-01-04 — End: 2020-03-21

## 2020-01-04 MED ORDER — POLYETHYLENE GLYCOL 3350 17 G PO PACK: 17.0000 g | PACK | Freq: Two times a day (BID) | ORAL | 3 refills | Status: DC

## 2020-01-04 MED ORDER — FLUTICASONE PROPIONATE 50 MCG/ACT NA SUSP: 1.0000 | Freq: Every day | NASAL | 2 refills | Status: DC

## 2020-01-04 MED ORDER — OMEPRAZOLE 40 MG PO CPDR
40.0000 mg | DELAYED_RELEASE_CAPSULE | Freq: Two times a day (BID) | ORAL | 0 refills | Status: DC
Start: 2020-01-04 — End: 2020-02-07

## 2020-01-04 MED ORDER — ATORVASTATIN CALCIUM 80 MG PO TABS
80.0000 mg | ORAL_TABLET | Freq: Every day | ORAL | 0 refills | Status: DC
Start: 2020-01-04 — End: 2020-02-07

## 2020-01-04 NOTE — Patient Instructions (Addendum)
New rx for medications have been sent over to [facility]. Rx's that were at [facility] have been cancelled. --Katie  1. Gastroesophageal reflux disease, unspecified whether esophagitis present  Discontinue omeprazole.   Start pantoprazole 40 mg one twice a day.  2. Nausea and vomiting, intractability of vomiting not specified, unspecified vomiting type  Order zofran for nausea.   3. Slow transit constipation   Start on miralax one to two daily.   4. Cough   Mucinex.  5. Dyphagia.  Order a barium swallowing study.

## 2020-01-04 NOTE — Progress Notes (Signed)
Virtual Visit via Telephone Note   This visit type was conducted due to national recommendations for restrictions regarding the COVID-19 Pandemic (e.g. social distancing) in an effort to limit this patient's exposure and mitigate transmission in our community.  Due to his co-morbid illnesses, this patient is at least at moderate risk for complications without adequate follow up.  This format is felt to be most appropriate for this patient at this time.  The patient did not have access to video technology/had technical difficulties with video requiring transitioning to audio format only (telephone).  All issues noted in this document were discussed and addressed.  No physical exam could be performed with this format.  Patient verbally consented to a telehealth visit.   Patient Location:home Provider Location:office  PCP:  Rochel Brome, MD   Evaluation Performed: acute.  HPI Patient is in today for vomiting twice a day since Monday. Drinking ginger ale/pedialyte.  Taking nauzene, but is not eating. Eating some. He has had BM x 1 this week. Was constipated for 1 1/2 weeks and took 5 doses of miralax. Good BM yesterday.  Not associated with eating.    Past Medical History:  Diagnosis Date   Apnea    Arthritis    Arthritis    spinal arthritis   Cancer ([place])    melanoma, basal cell   Centrilobular emphysema ([place]) 07/08/2015   Depression    Emphysema of lung (HCC)    COPD   GERD (gastroesophageal reflux disease)    Hypercholesterolemia    Hypertension    Impaired fasting glucose    Kyphosis    mild   Migraine    Neuropathy    Osteoarthritis    Other testicular dysfunction    Scoliosis    Sleep apnea    Solitary pulmonary nodule    Tinnitus     Past Surgical History:  Procedure Laterality Date   COLONOSCOPY  2016   Colon polyps. Tubular Adenoma.   TONSILLECTOMY      Family History  Problem Relation Age of Onset   Heart disease Father    Stroke  Father    Breast cancer Maternal Grandmother    Leukemia Mother    Skin cancer Mother    Diabetes Mother    Stroke Mother    Hypertension Mother    Skin cancer Brother    Cancer Brother        Melanoma   Hypertension Brother    Colon cancer Neg Hx     Social History   Socioeconomic History   Marital status: Married    Spouse name: Baker Janus   Number of children: 2   Years of education: 12   Highest education level: Not on file  Occupational History   Occupation: disabled  Tobacco Use   Smoking status: Current Every Day Smoker    Packs/day: 0.75    Types: Cigarettes    Start date: 1974   Smokeless tobacco: Never Used   Tobacco comment: currently smoking 3/4 ppd. For years smoked 1 1/2 ppd   Vaping Use   Vaping Use: Never used  Substance and Sexual Activity   Alcohol use: No    Alcohol/week: 0.0 standard drinks   Drug use: No   Sexual activity: Not on file  Other Topics Concern   Not on file  Social History Narrative   Lives with wife   Caffeine- coffee, 3 cups daily   Social Determinants of Health   Financial Resource Strain:    Difficulty of  Paying Living Expenses: Not on file  Food Insecurity: No Food Insecurity   Worried About [place] in the Last Year: Never true   Ran Out of Food in the Last Year: Never true  Transportation Needs: No Transportation Needs   Lack of Transportation (Medical): No   Lack of Transportation (Non-Medical): No  Physical Activity:    Days of Exercise per Week: Not on file   Minutes of Exercise per Session: Not on file  Stress:    Feeling of Stress : Not on file  Social Connections:    Frequency of Communication with Friends and Family: Not on file   Frequency of Social Gatherings with Friends and Family: Not on file   Attends Religious Services: Not on file   Active Member of Clubs or Organizations: Not on file   Attends Archivist Meetings: Not on file   Marital Status:  Not on file  Intimate Partner Violence:    Fear of Current or Ex-Partner: Not on file   Emotionally Abused: Not on file   Physically Abused: Not on file   Sexually Abused: Not on file    Outpatient Medications Prior to Visit  Medication Sig Dispense Refill   albuterol (VENTOLIN HFA) 108 (90 Base) MCG/ACT inhaler Inhale 2 puffs into the lungs every 6 (six) hours as needed for wheezing or shortness of breath. 8 g 3   Aspirin-Acetaminophen-Caffeine (GOODYS EXTRA STRENGTH) 500-325-65 MG PACK Take 325 mg by mouth 2 (two) times daily as needed.     atorvastatin (LIPITOR) 80 MG tablet Take 80 mg by mouth daily.     fluticasone (FLONASE) 50 MCG/ACT nasal spray Place 1 spray into both nostrils daily. 16 g 2   gabapentin (NEURONTIN) 400 MG capsule Take 400 mg by mouth 4 (four) times daily.      ibuprofen (ADVIL) 800 MG tablet TAKE 1 TABLET(800 MG) BY MOUTH THREE TIMES DAILY WITH FOOD 90 tablet 1   montelukast (SINGULAIR) 10 MG tablet TAKE 1 TABLET(10 MG) BY MOUTH AT BEDTIME 90 tablet 3   omeprazole (PRILOSEC) 40 MG capsule Take 1 capsule (40 mg total) by mouth 2 (two) times daily. 180 capsule 1   polyethylene glycol (MIRALAX / GLYCOLAX) 17 g packet Take 17 g by mouth daily as needed.     propranolol (INDERAL) 60 MG tablet Take 60 mg by mouth daily.      SPIRIVA RESPIMAT 2.5 MCG/ACT AERS INHALE 2 PUFFS INTO THE LUNGS DAILY 4 g 2   tamsulosin (FLOMAX) 0.4 MG CAPS capsule TAKE 2 CAPSULES(0.8 MG) BY MOUTH DAILY AFTER SUPPER 180 capsule 1   varenicline (CHANTIX CONTINUING MONTH PAK) 1 MG tablet Take 1 tablet (1 mg total) by mouth 2 (two) times daily. 60 tablet 5   varenicline (CHANTIX) 1 MG tablet Take 0.5 tablets (0.5 mg total) by mouth daily for 3 days, THEN 0.5 tablets (0.5 mg total) 2 (two) times daily for 4 days, THEN 1 tablet (1 mg total) 2 (two) times daily for 21 days. 48 tablet 0   polyethylene glycol powder (GLYCOLAX/MIRALAX) 17 GM/SCOOP powder MIX 17 GRAMS WITH 8 OUNCES OF  WATER, JUICE, SODA, COFFEE/TEA AND DRINK ONCE DAILY. 510 g 1   No facility-administered medications prior to visit.    Allergies  Allergen Reactions   Codeine Nausea Only and Nausea And Vomiting   Duloxetine     Loss of appetite   Fentanyl     Fatigue    Fluoxetine     Agitation  Ipratropium-Albuterol     Myalgia   Penicillins Itching and Other (See Comments)    As child    Zoloft [Sertraline]     Sweating     Review of Systems  Constitutional: Positive for fatigue. Negative for chills and fever.  HENT: Positive for rhinorrhea. Negative for congestion, ear pain and sore throat.   Respiratory: Positive for cough (post emesis.) and shortness of breath (after vomiting. ).   Cardiovascular: Negative for chest pain and palpitations.  Gastrointestinal: Positive for constipation, nausea and vomiting. Negative for abdominal pain and diarrhea.  Genitourinary: Negative for dysuria.  Musculoskeletal: Positive for back pain. Negative for arthralgias and myalgias.  Neurological: Positive for weakness. Negative for headaches.   Bp 128/70 yesterday.    Objective:    Physical Exam  BP (!) 152/90    Pulse 78    Temp (!) 97.2 F (36.2 C)    SpO2 93%  Wt Readings from Last 3 Encounters:  12/19/19 179 lb 3.2 oz (81.3 kg)  11/14/19 176 lb 6.4 oz (80 kg)  09/17/19 174 lb 6.4 oz (79.1 kg)    Health Maintenance Due  Topic Date Due   Hepatitis C Screening  Never done   COVID-19 Vaccine (1) Never done   HIV Screening  Never done    There are no preventive care reminders to display for this patient.   Lab Results  Component Value Date   TSH 1.090 12/19/2019   Lab Results  Component Value Date   WBC 7.3 12/19/2019   HGB 13.8 12/19/2019   HCT 41.4 12/19/2019   MCV 92 12/19/2019   PLT 198 12/19/2019   Lab Results  Component Value Date   NA 141 12/19/2019   K 5.0 12/19/2019   CO2 28 12/19/2019   GLUCOSE 93 12/19/2019   BUN 11 12/19/2019   CREATININE 0.72 (L)  12/19/2019   BILITOT 0.5 12/19/2019   ALKPHOS 134 (H) 12/19/2019   AST 9 12/19/2019   ALT 13 12/19/2019   PROT 6.4 12/19/2019   ALBUMIN 4.4 12/19/2019   CALCIUM 9.0 12/19/2019   GFR 95.22 07/15/2017   Lab Results  Component Value Date   CHOL 121 12/19/2019   Lab Results  Component Value Date   HDL 39 (L) 12/19/2019   Lab Results  Component Value Date   LDLCALC 68 12/19/2019   Lab Results  Component Value Date   TRIG 67 12/19/2019   Lab Results  Component Value Date   CHOLHDL 3.1 12/19/2019   No results found for: HGBA1C     Assessment & Plan:  1. Gastroesophageal reflux disease, unspecified whether esophagitis present  Discontinue omeprazole.   Start pantoprazole 40 mg one twice a day.  2. Nausea and vomiting, intractability of vomiting not specified, unspecified vomiting type  Order zofran   3. Slow transit constipation   Start on miralax one to two daily.   4. Cough   Mucinex.  5. Dyphagia.  Order a barium swallowing study.    Time:   Today, I have spent 15 minutes with the patient with telehealth technology discussing the above problems.    Follow Up:  In Person prn  An After Visit Summary was printed and given to the patient.  Rochel Brome [facility] [phone]

## 2020-01-07 ENCOUNTER — Encounter: Payer: Self-pay | Admitting: Family Medicine

## 2020-01-23 DIAGNOSIS — R1310 Dysphagia, unspecified: Secondary | ICD-10-CM | POA: Diagnosis not present

## 2020-01-25 ENCOUNTER — Other Ambulatory Visit: Payer: Self-pay | Admitting: Family Medicine

## 2020-01-25 DIAGNOSIS — F17219 Nicotine dependence, cigarettes, with unspecified nicotine-induced disorders: Secondary | ICD-10-CM

## 2020-02-01 DIAGNOSIS — M545 Low back pain, unspecified: Secondary | ICD-10-CM | POA: Diagnosis not present

## 2020-02-01 DIAGNOSIS — E7800 Pure hypercholesterolemia, unspecified: Secondary | ICD-10-CM | POA: Diagnosis not present

## 2020-02-01 DIAGNOSIS — F32A Depression, unspecified: Secondary | ICD-10-CM | POA: Diagnosis not present

## 2020-02-01 DIAGNOSIS — M546 Pain in thoracic spine: Secondary | ICD-10-CM | POA: Diagnosis not present

## 2020-02-01 DIAGNOSIS — Z79899 Other long term (current) drug therapy: Secondary | ICD-10-CM | POA: Diagnosis not present

## 2020-02-04 ENCOUNTER — Other Ambulatory Visit: Payer: Self-pay | Admitting: Family Medicine

## 2020-02-04 DIAGNOSIS — R112 Nausea with vomiting, unspecified: Secondary | ICD-10-CM

## 2020-02-04 MED ORDER — ONDANSETRON HCL 4 MG PO TABS: 4.0000 mg | ORAL_TABLET | Freq: Three times a day (TID) | ORAL | 0 refills | Status: DC | PRN

## 2020-02-06 ENCOUNTER — Other Ambulatory Visit: Payer: Self-pay | Admitting: Family Medicine

## 2020-02-07 ENCOUNTER — Other Ambulatory Visit: Payer: Self-pay | Admitting: Family Medicine

## 2020-02-19 ENCOUNTER — Other Ambulatory Visit: Payer: Self-pay | Admitting: Family Medicine

## 2020-02-28 DIAGNOSIS — M129 Arthropathy, unspecified: Secondary | ICD-10-CM | POA: Diagnosis not present

## 2020-02-28 DIAGNOSIS — Z79899 Other long term (current) drug therapy: Secondary | ICD-10-CM | POA: Diagnosis not present

## 2020-02-28 DIAGNOSIS — Z20822 Contact with and (suspected) exposure to covid-19: Secondary | ICD-10-CM | POA: Diagnosis not present

## 2020-02-28 DIAGNOSIS — E7800 Pure hypercholesterolemia, unspecified: Secondary | ICD-10-CM | POA: Diagnosis not present

## 2020-02-28 DIAGNOSIS — E559 Vitamin D deficiency, unspecified: Secondary | ICD-10-CM | POA: Diagnosis not present

## 2020-02-28 DIAGNOSIS — M546 Pain in thoracic spine: Secondary | ICD-10-CM | POA: Diagnosis not present

## 2020-02-28 DIAGNOSIS — F32A Depression, unspecified: Secondary | ICD-10-CM | POA: Diagnosis not present

## 2020-02-28 DIAGNOSIS — R0602 Shortness of breath: Secondary | ICD-10-CM | POA: Diagnosis not present

## 2020-02-28 DIAGNOSIS — M545 Low back pain, unspecified: Secondary | ICD-10-CM | POA: Diagnosis not present

## 2020-03-12 ENCOUNTER — Other Ambulatory Visit: Payer: Self-pay | Admitting: Physician Assistant

## 2020-03-12 DIAGNOSIS — R112 Nausea with vomiting, unspecified: Secondary | ICD-10-CM

## 2020-03-16 ENCOUNTER — Other Ambulatory Visit: Payer: Self-pay | Admitting: Family Medicine

## 2020-03-20 ENCOUNTER — Other Ambulatory Visit: Payer: Self-pay | Admitting: Family Medicine

## 2020-03-21 ENCOUNTER — Encounter: Payer: Self-pay | Admitting: Family Medicine

## 2020-03-21 ENCOUNTER — Telehealth (INDEPENDENT_AMBULATORY_CARE_PROVIDER_SITE_OTHER): Payer: Medicare Other | Admitting: Family Medicine

## 2020-03-21 VITALS — BP 124/76 | HR 65 | Resp 18 | Ht 68.0 in | Wt 179.0 lb

## 2020-03-21 DIAGNOSIS — G894 Chronic pain syndrome: Secondary | ICD-10-CM

## 2020-03-21 DIAGNOSIS — I10 Essential (primary) hypertension: Secondary | ICD-10-CM

## 2020-03-21 DIAGNOSIS — H903 Sensorineural hearing loss, bilateral: Secondary | ICD-10-CM | POA: Diagnosis not present

## 2020-03-21 DIAGNOSIS — F17210 Nicotine dependence, cigarettes, uncomplicated: Secondary | ICD-10-CM

## 2020-03-21 DIAGNOSIS — K219 Gastro-esophageal reflux disease without esophagitis: Secondary | ICD-10-CM | POA: Diagnosis not present

## 2020-03-21 DIAGNOSIS — Z974 Presence of external hearing-aid: Secondary | ICD-10-CM | POA: Diagnosis not present

## 2020-03-21 DIAGNOSIS — K5903 Drug induced constipation: Secondary | ICD-10-CM | POA: Diagnosis not present

## 2020-03-21 DIAGNOSIS — J449 Chronic obstructive pulmonary disease, unspecified: Secondary | ICD-10-CM | POA: Diagnosis not present

## 2020-03-21 DIAGNOSIS — Z461 Encounter for fitting and adjustment of hearing aid: Secondary | ICD-10-CM | POA: Diagnosis not present

## 2020-03-21 NOTE — Progress Notes (Signed)
Virtual Visit via Telephone Note   This visit type was conducted due to national recommendations for restrictions regarding the COVID-19 Pandemic (e.g. social distancing) in an effort to limit this patient's exposure and mitigate transmission in our community.  Due to his co-morbid illnesses, this patient is at least at moderate risk for complications without adequate follow up.  This format is felt to be most appropriate for this patient at this time.  The patient did not have access to video technology/had technical difficulties with video requiring transitioning to audio format only (telephone).  All issues noted in this document were discussed and addressed.  No physical exam could be performed with this format.  Patient verbally consented to a telehealth visit.   Patient Location:home Provider Location:office  PCP:  Rochel Brome, MD   Evaluation Performed:  Follow-Up Visit   Subjective:  Patient ID: Jacob Graves, male    DOB: 1955-09-24  Age: 65 y.o. MRN: [id number]  Chief Complaint  Patient presents with  . Gastroesophageal Reflux  . Hypertension  . COPD    HPI COPD: breathing well. Chantix is starting to work. Decreased 5-6  cigs per day. On ventolin (using sparingly) and spiriva. For allergies, taking singulair and flonase.  Hyperlipidemia: On lipitor.  GERD: omeprazole 40 mg twice daily.  Constipation: related to pain medicines. Taking miralax.  Chronic back pain: On suboxone, ibuprofen, gabapentin.   Current Outpatient Medications on File Prior to Visit  Medication Sig Dispense Refill  . albuterol (VENTOLIN HFA) 108 (90 Base) MCG/ACT inhaler Inhale 2 puffs into the lungs every 6 (six) hours as needed for wheezing or shortness of breath. 8 g 3  . Aspirin-Acetaminophen-Caffeine 500-325-65 MG PACK Take 325 mg by mouth 2 (two) times daily as needed.    Marland Kitchen atorvastatin (LIPITOR) 80 MG tablet TAKE 1 TABLET BY MOUTH  DAILY 90 tablet 1  . Buprenorphine HCl-Naloxone HCl 8-2  MG FILM Place under the tongue.    . fluticasone (FLONASE) 50 MCG/ACT nasal spray Place 1 spray into both nostrils daily. 16 g 2  . gabapentin (NEURONTIN) 600 MG tablet Take 600 mg by mouth 2 (two) times daily. Takes two tablets BID    . ibuprofen (ADVIL) 800 MG tablet TAKE 1 TABLET(800 MG) BY MOUTH THREE TIMES DAILY WITH FOOD 90 tablet 1  . montelukast (SINGULAIR) 10 MG tablet TAKE 1 TABLET(10 MG) BY MOUTH AT BEDTIME 90 tablet 3  . omeprazole (PRILOSEC) 40 MG capsule TAKE 1 CAPSULE BY MOUTH  DAILY BEFORE A MEAL 90 capsule 1  . ondansetron (ZOFRAN) 4 MG tablet TAKE ONE TABLET BY MOUTH EVERY 8 HOURS AS NEEDED FOR NAUSEA/VOMITING 60 tablet 0  . polyethylene glycol (MIRALAX MIX-IN PAX) 17 g packet Take 17 g by mouth 2 (two) times daily. 200 each 3  . polyethylene glycol powder (GLYCOLAX/MIRALAX) 17 GM/SCOOP powder Take 17 g by mouth daily.    . propranolol (INDERAL) 60 MG tablet TAKE 1 TABLET BY MOUTH  TWICE DAILY 180 tablet 1  . tamsulosin (FLOMAX) 0.4 MG CAPS capsule TAKE 2 CAPSULES(0.8 MG) BY MOUTH DAILY AFTER SUPPER 180 capsule 1  . Tiotropium Bromide Monohydrate (SPIRIVA RESPIMAT) 2.5 MCG/ACT AERS Inhale 2 puffs into the lungs daily. 4 g 2  . varenicline (CHANTIX) 1 MG tablet Take 1 tablet (1 mg total) by mouth 2 (two) times daily. 60 tablet 3   No current facility-administered medications on file prior to visit.   Past Medical History:  Diagnosis Date  . Apnea   .  Arthritis   . Arthritis    spinal arthritis  . Cancer (HCC)    melanoma, basal cell  . Centrilobular emphysema ([place]) 07/08/2015  . Depression   . Emphysema of lung (HCC)    COPD  . GERD (gastroesophageal reflux disease)   . Hypercholesterolemia   . Hypertension   . Impaired fasting glucose   . Kyphosis    mild  . Migraine   . Neuropathy   . Osteoarthritis   . Other testicular dysfunction   . Scoliosis   . Sleep apnea   . Solitary pulmonary nodule   . Tinnitus    Past Surgical History:  Procedure Laterality Date   . COLONOSCOPY  2016   Colon polyps. Tubular Adenoma.  . TONSILLECTOMY      Family History  Problem Relation Age of Onset  . Heart disease Father   . Stroke Father   . Breast cancer Maternal Grandmother   . Leukemia Mother   . Skin cancer Mother   . Diabetes Mother   . Stroke Mother   . Hypertension Mother   . Skin cancer Brother   . Cancer Brother        Melanoma  . Hypertension Brother   . Colon cancer Neg Hx    Social History   Socioeconomic History  . Marital status: Married    Spouse name: Baker Janus  . Number of children: 2  . Years of education: 62  . Highest education level: Not on file  Occupational History  . Occupation: disabled  Tobacco Use  . Smoking status: Current Every Day Smoker    Packs/day: 0.75    Types: Cigarettes    Start date: 27  . Smokeless tobacco: Never Used  . Tobacco comment: currently smoking 3/4 ppd. For years smoked 1 1/2 ppd   Vaping Use  . Vaping Use: Never used  Substance and Sexual Activity  . Alcohol use: No    Alcohol/week: 0.0 standard drinks  . Drug use: No  . Sexual activity: Not on file  Other Topics Concern  . Not on file  Social History Narrative   Lives with wife   Caffeine- coffee, 3 cups daily   Social Determinants of Health   Financial Resource Strain: Not on file  Food Insecurity: No Food Insecurity  . Worried About [place] fund[place] in the Last Year: Never true  . Ran Out of Food in the Last Year: Never true  Transportation Needs: No Transportation Needs  . Lack of Transportation (Medical): No  . Lack of Transportation (Non-Medical): No  Physical Activity: Not on file  Stress: Not on file  Social Connections: Not on file    Review of Systems  Constitutional: Positive for fatigue. Negative for chills and fever.  HENT: Positive for congestion. Negative for ear pain and sore throat.   Respiratory: Negative for cough and shortness of breath.   Cardiovascular: Negative for chest pain.  Gastrointestinal:  Positive for constipation. Negative for abdominal pain, diarrhea, nausea and vomiting.  Endocrine: Negative for polydipsia, polyphagia and polyuria.  Genitourinary: Negative for dysuria and frequency.  Musculoskeletal: Positive for back pain. Negative for arthralgias and myalgias.  Neurological: Negative for dizziness and headaches.  Psychiatric/Behavioral: Negative for dysphoric mood.       No dysphoria    Objective:  BP 124/76   Pulse 65   Resp 18   Ht 5\' 8"  (1.727 m)   Wt 179 lb (81.2 kg)   SpO2 94%   BMI 27.22 kg/m  BP/Weight 03/21/2020 01/04/2020 [id number]  Systolic BP [id number]  Diastolic BP 76 90 80  Wt. (Lbs) 179 - 179.2  BMI 27.22 - 27.25    Physical Exam NO exam done.   Lab Results  Component Value Date   WBC 7.3 12/19/2019   HGB 13.8 12/19/2019   HCT 41.4 12/19/2019   PLT 198 12/19/2019   GLUCOSE 93 12/19/2019   CHOL 121 12/19/2019   TRIG 67 12/19/2019   HDL 39 (L) 12/19/2019   LDLCALC 68 12/19/2019   ALT 13 12/19/2019   AST 9 12/19/2019   NA 141 12/19/2019   K 5.0 12/19/2019   CL 104 12/19/2019   CREATININE 0.72 (L) 12/19/2019   BUN 11 12/19/2019   CO2 28 12/19/2019   TSH 1.090 12/19/2019      Assessment & Plan:   1. Gastroesophageal reflux disease without esophagitis The current medical regimen is effective;  continue present plan and medications.  2. Essential hypertension, benign Well controlled.  No changes to medicines.  Continue to work on eating a healthy diet and exercise.   3. COPD, moderate ([place]) The current medical regimen is effective;  continue present plan and medications. Recommend quit smoking.   4. Chronic pain syndrome The current medical regimen is effective;  continue present plan and medications.  5. Cigarette nicotine dependence without complication Recommend cessation.  6. Drug induced constipation  Get samples of medicine for patient. - Linzess. Stop miralax.  I spent 15 minutes dedicated to the care of  this patient on the date of this encounter.  Follow-up: 3 months.   An After Visit Summary was printed and given to the patient.  Rochel Brome, MD [facility] [phone]

## 2020-03-25 ENCOUNTER — Telehealth: Payer: Self-pay

## 2020-03-25 NOTE — Progress Notes (Signed)
    Chronic Care Management Pharmacy Assistant   Name: Jacob Graves  MRN: [id number] DOB: 07/11/1955  Reason for Encounter: General Adherence Call Patient Questions:  1.  Have you seen any other providers since your last visit? Yes,   10/10/19-PA [facility] for back pain 11/09/19-PA Sabra Heck for back pain 11/14/19-PCP 10/20/21_PCP 01/04/20-PCP 03/21/20-PCP  Gabapentin was changed to 600mg   2.  Any changes in your medicines or health? Gabapentin changed to 600mg     PCP : Rochel Brome, MD  Allergies:   Allergies  Allergen Reactions  . Codeine Nausea Only and Nausea And Vomiting  . Duloxetine     Loss of appetite  . Fentanyl     Fatigue   . Fluoxetine     Agitation  . Ipratropium-Albuterol     Myalgia  . Penicillins Itching and Other (See Comments)    As child   . Zoloft [Sertraline]     Sweating     Medications: Outpatient Encounter Medications as of 03/25/2020  Medication Sig  . albuterol (VENTOLIN HFA) 108 (90 Base) MCG/ACT inhaler Inhale 2 puffs into the lungs every 6 (six) hours as needed for wheezing or shortness of breath.  . Aspirin-Acetaminophen-Caffeine 500-325-65 MG PACK Take 325 mg by mouth 2 (two) times daily as needed.  Marland Kitchen atorvastatin (LIPITOR) 80 MG tablet TAKE 1 TABLET BY MOUTH  DAILY  . Buprenorphine HCl-Naloxone HCl 8-2 MG FILM Place under the tongue.  . fluticasone (FLONASE) 50 MCG/ACT nasal spray Place 1 spray into both nostrils daily.  Marland Kitchen gabapentin (NEURONTIN) 600 MG tablet Take 600 mg by mouth 2 (two) times daily. Takes two tablets BID  . ibuprofen (ADVIL) 800 MG tablet TAKE 1 TABLET(800 MG) BY MOUTH THREE TIMES DAILY WITH FOOD  . montelukast (SINGULAIR) 10 MG tablet TAKE 1 TABLET(10 MG) BY MOUTH AT BEDTIME  . omeprazole (PRILOSEC) 40 MG capsule TAKE 1 CAPSULE BY MOUTH  DAILY BEFORE A MEAL  . ondansetron (ZOFRAN) 4 MG tablet TAKE ONE TABLET BY MOUTH EVERY 8 HOURS AS NEEDED FOR NAUSEA/VOMITING  . polyethylene glycol (MIRALAX MIX-IN PAX) 17 g packet Take  17 g by mouth 2 (two) times daily.  . polyethylene glycol powder (GLYCOLAX/MIRALAX) 17 GM/SCOOP powder Take 17 g by mouth daily.  . propranolol (INDERAL) 60 MG tablet TAKE 1 TABLET BY MOUTH  TWICE DAILY  . tamsulosin (FLOMAX) 0.4 MG CAPS capsule TAKE 2 CAPSULES(0.8 MG) BY MOUTH DAILY AFTER SUPPER  . Tiotropium Bromide Monohydrate (SPIRIVA RESPIMAT) 2.5 MCG/ACT AERS Inhale 2 puffs into the lungs daily.  . varenicline (CHANTIX) 1 MG tablet Take 1 tablet (1 mg total) by mouth 2 (two) times daily.   No facility-administered encounter medications on file as of 03/25/2020.    Current Diagnosis: Patient Active Problem List   Diagnosis Date Noted  . Gastroesophageal reflux disease 09/17/2019  . Extremity atherosclerosis with intermittent claudication ([place]) 06/14/2019  . Chronic pain syndrome 06/14/2019  . Uncomplicated opioid dependence ([place]) 06/14/2019  . Chronic midline low back pain without sciatica 06/13/2019  . Cigarette nicotine dependence with nicotine-induced disorder 06/13/2019  . Non-seasonal allergic rhinitis due to pollen 05/06/2019  . Bilateral sensorineural hearing loss 05/06/2019  . Essential hypertension, benign 04/09/2019  . Tinnitus 08/23/2016  . COPD, moderate ([place]) 07/17/2015  . Pulmonary nodule 07/08/2015  . Smoking history 07/08/2015  . Centrilobular emphysema ([place]) 07/08/2015   Unable to reach patient    Follow-Up:  Pharmacist Review  Donette Larry, CPP notified  Clarita Leber, [place] Pharmacist Assistant [phone]

## 2020-03-27 DIAGNOSIS — Z1159 Encounter for screening for other viral diseases: Secondary | ICD-10-CM | POA: Diagnosis not present

## 2020-03-27 DIAGNOSIS — Z20822 Contact with and (suspected) exposure to covid-19: Secondary | ICD-10-CM | POA: Diagnosis not present

## 2020-03-27 DIAGNOSIS — R0602 Shortness of breath: Secondary | ICD-10-CM | POA: Diagnosis not present

## 2020-03-27 DIAGNOSIS — M545 Low back pain, unspecified: Secondary | ICD-10-CM | POA: Diagnosis not present

## 2020-03-27 DIAGNOSIS — Z79899 Other long term (current) drug therapy: Secondary | ICD-10-CM | POA: Diagnosis not present

## 2020-03-27 DIAGNOSIS — M546 Pain in thoracic spine: Secondary | ICD-10-CM | POA: Diagnosis not present

## 2020-03-27 DIAGNOSIS — F17210 Nicotine dependence, cigarettes, uncomplicated: Secondary | ICD-10-CM | POA: Diagnosis not present

## 2020-03-27 DIAGNOSIS — M79605 Pain in left leg: Secondary | ICD-10-CM | POA: Diagnosis not present

## 2020-03-27 DIAGNOSIS — M79604 Pain in right leg: Secondary | ICD-10-CM | POA: Diagnosis not present

## 2020-03-27 DIAGNOSIS — Z122 Encounter for screening for malignant neoplasm of respiratory organs: Secondary | ICD-10-CM | POA: Diagnosis not present

## 2020-03-28 ENCOUNTER — Telehealth: Payer: Self-pay

## 2020-03-28 NOTE — Progress Notes (Signed)
Chronic Care Management Pharmacy Assistant   Name: SOPHEAP BOEHLE  MRN: [id number] DOB: 10-05-1955  Reason for Encounter: General Adherence Call  Patient Questions:  1.  Have you seen any other providers since your last visit? Yes 03/21/20-PCP  Increased Gabapentin to 600mg  11/09/19 [facility]-back pain  2.  Any changes in your medicines or health? Yes 03/21/20-PCP  Increased Gabapentin to 600mg     PCP : Rochel Brome, MD  Allergies:   Allergies  Allergen Reactions  . Codeine Nausea Only and Nausea And Vomiting  . Duloxetine     Loss of appetite  . Fentanyl     Fatigue   . Fluoxetine     Agitation  . Ipratropium-Albuterol     Myalgia  . Penicillins Itching and Other (See Comments)    As child   . Zoloft [Sertraline]     Sweating     Medications: Outpatient Encounter Medications as of 03/28/2020  Medication Sig  . albuterol (VENTOLIN HFA) 108 (90 Base) MCG/ACT inhaler Inhale 2 puffs into the lungs every 6 (six) hours as needed for wheezing or shortness of breath.  . Aspirin-Acetaminophen-Caffeine 500-325-65 MG PACK Take 325 mg by mouth 2 (two) times daily as needed.  Marland Kitchen atorvastatin (LIPITOR) 80 MG tablet TAKE 1 TABLET BY MOUTH  DAILY  . Buprenorphine HCl-Naloxone HCl 8-2 MG FILM Place under the tongue.  . fluticasone (FLONASE) 50 MCG/ACT nasal spray Place 1 spray into both nostrils daily.  Marland Kitchen gabapentin (NEURONTIN) 600 MG tablet Take 600 mg by mouth 2 (two) times daily. Takes two tablets BID  . ibuprofen (ADVIL) 800 MG tablet TAKE 1 TABLET(800 MG) BY MOUTH THREE TIMES DAILY WITH FOOD  . montelukast (SINGULAIR) 10 MG tablet TAKE 1 TABLET(10 MG) BY MOUTH AT BEDTIME  . omeprazole (PRILOSEC) 40 MG capsule TAKE 1 CAPSULE BY MOUTH  DAILY BEFORE A MEAL  . ondansetron (ZOFRAN) 4 MG tablet TAKE ONE TABLET BY MOUTH EVERY 8 HOURS AS NEEDED FOR NAUSEA/VOMITING  . polyethylene glycol (MIRALAX MIX-IN PAX) 17 g packet Take 17 g by mouth 2 (two) times daily.  . polyethylene glycol powder  (GLYCOLAX/MIRALAX) 17 GM/SCOOP powder Take 17 g by mouth daily.  . propranolol (INDERAL) 60 MG tablet TAKE 1 TABLET BY MOUTH  TWICE DAILY  . tamsulosin (FLOMAX) 0.4 MG CAPS capsule TAKE 2 CAPSULES(0.8 MG) BY MOUTH DAILY AFTER SUPPER  . Tiotropium Bromide Monohydrate (SPIRIVA RESPIMAT) 2.5 MCG/ACT AERS Inhale 2 puffs into the lungs daily.  . varenicline (CHANTIX) 1 MG tablet Take 1 tablet (1 mg total) by mouth 2 (two) times daily.   No facility-administered encounter medications on file as of 03/28/2020.    Current Diagnosis: Patient Active Problem List   Diagnosis Date Noted  . Gastroesophageal reflux disease 09/17/2019  . Extremity atherosclerosis with intermittent claudication ([place]) 06/14/2019  . Chronic pain syndrome 06/14/2019  . Uncomplicated opioid dependence ([place]) 06/14/2019  . Chronic midline low back pain without sciatica 06/13/2019  . Cigarette nicotine dependence with nicotine-induced disorder 06/13/2019  . Non-seasonal allergic rhinitis due to pollen 05/06/2019  . Bilateral sensorineural hearing loss 05/06/2019  . Essential hypertension, benign 04/09/2019  . Tinnitus 08/23/2016  . COPD, moderate ([place]) 07/17/2015  . Pulmonary nodule 07/08/2015  . Smoking history 07/08/2015  . Centrilobular emphysema ([place]) 07/08/2015   Spoke with patient, he stated he is doing well overall.  Patient stated he does have some increased back pain, he has been out cutting wood, he stated that he medication increase in his Gabapentin  to 600mg , has helped.  Patient stated he has had some issues at [facility], so he is now switching to [facility], and he is much happier.    Patient stated his blood pressure numbers are good, yesterday it was 128/70.  He stated he has no side effects from his medications.   Patient will contact us with any needs or changes in health.  Follow-Up:  Pharmacist Review  Donette Larry, CPP notified  Clarita Leber, [place] Pharmacist  Assistant [phone]

## 2020-03-30 ENCOUNTER — Encounter: Payer: Self-pay | Admitting: Family Medicine

## 2020-04-10 ENCOUNTER — Other Ambulatory Visit: Payer: Self-pay

## 2020-04-10 MED ORDER — LINACLOTIDE 145 MCG PO CAPS: 145.0000 ug | ORAL_CAPSULE | Freq: Every day | ORAL | 3 refills | Status: DC

## 2020-04-23 DIAGNOSIS — E7800 Pure hypercholesterolemia, unspecified: Secondary | ICD-10-CM | POA: Diagnosis not present

## 2020-04-23 DIAGNOSIS — M545 Low back pain, unspecified: Secondary | ICD-10-CM | POA: Diagnosis not present

## 2020-04-23 DIAGNOSIS — E559 Vitamin D deficiency, unspecified: Secondary | ICD-10-CM | POA: Diagnosis not present

## 2020-04-23 DIAGNOSIS — Z20822 Contact with and (suspected) exposure to covid-19: Secondary | ICD-10-CM | POA: Diagnosis not present

## 2020-04-23 DIAGNOSIS — Z122 Encounter for screening for malignant neoplasm of respiratory organs: Secondary | ICD-10-CM | POA: Diagnosis not present

## 2020-04-23 DIAGNOSIS — F17210 Nicotine dependence, cigarettes, uncomplicated: Secondary | ICD-10-CM | POA: Diagnosis not present

## 2020-04-23 DIAGNOSIS — M546 Pain in thoracic spine: Secondary | ICD-10-CM | POA: Diagnosis not present

## 2020-04-23 DIAGNOSIS — M129 Arthropathy, unspecified: Secondary | ICD-10-CM | POA: Diagnosis not present

## 2020-04-23 DIAGNOSIS — Z79899 Other long term (current) drug therapy: Secondary | ICD-10-CM | POA: Diagnosis not present

## 2020-04-24 ENCOUNTER — Telehealth: Payer: Self-pay

## 2020-04-24 NOTE — Telephone Encounter (Signed)
Dr. Cox/Carolyn please advise.  Patient's wife is requesting a different pain clinic that will treat his pain using suboxone. [facility] in [place] is where they are now but services are being billed that aren't being completed. She is hoping we can get him to an alternative pain clinic soon. I know there is [facility] but not sure if they do suboxone. [facility] does not do suboxone. Dr. Tobie Poet do you have any suggestions on where to send pt for treatment of suboxine and Gabapentin?

## 2020-04-24 NOTE — Telephone Encounter (Signed)
Recommend [facility]. Dr Truman Hayward or Dr. Megan Salon do [place] [facility].  The office directly behind [place] where employees park is a [facility], but I do not know much about it.

## 2020-04-29 ENCOUNTER — Other Ambulatory Visit: Payer: Self-pay | Admitting: Adult Health

## 2020-05-06 ENCOUNTER — Telehealth: Payer: Self-pay

## 2020-05-06 NOTE — Progress Notes (Signed)
Chronic Care Management Pharmacy Assistant   Name: Jacob [facility]  MRN: [id number] DOB: 02/01/1956   Reason for Encounter: Disease State for general adherence  Recent office visits:  03/21/20-Audiology 03/21/20-PCP, Gabapentin increased to 629m, Linzess samples given, stop Miralax  Recent consult visits:  None  Hospital visits:  None in previous 6 months  Medications: Outpatient Encounter Medications as of 05/06/2020  Medication Sig   albuterol (VENTOLIN HFA) 108 (90 Base) MCG/ACT inhaler Inhale 2 puffs into the lungs every 6 (six) hours as needed for wheezing or shortness of breath.   Aspirin-Acetaminophen-Caffeine 500-325-65 MG PACK Take 325 mg by mouth 2 (two) times daily as needed.   atorvastatin (LIPITOR) 80 MG tablet TAKE 1 TABLET BY MOUTH  DAILY   Buprenorphine HCl-Naloxone HCl 8-2 MG FILM Place under the tongue.   fluticasone (FLONASE) 50 MCG/ACT nasal spray Place 1 spray into both nostrils daily.   gabapentin (NEURONTIN) 600 MG tablet Take 600 mg by mouth 2 (two) times daily. Takes two tablets BID   ibuprofen (ADVIL) 800 MG tablet TAKE 1 TABLET(800 MG) BY MOUTH THREE TIMES DAILY WITH FOOD   linaclotide (LINZESS) 145 MCG CAPS capsule Take 1 capsule (145 mcg total) by mouth daily before breakfast.   montelukast (SINGULAIR) 10 MG tablet TAKE 1 TABLET(10 MG) BY MOUTH AT BEDTIME   omeprazole (PRILOSEC) 40 MG capsule TAKE 1 CAPSULE BY MOUTH  DAILY BEFORE A MEAL   ondansetron (ZOFRAN) 4 MG tablet TAKE ONE TABLET BY MOUTH EVERY 8 HOURS AS NEEDED FOR NAUSEA/VOMITING   polyethylene glycol (MIRALAX MIX-IN PAX) 17 g packet Take 17 g by mouth 2 (two) times daily.   polyethylene glycol powder (GLYCOLAX/MIRALAX) 17 GM/SCOOP powder Take 17 g by mouth daily.   propranolol (INDERAL) 60 MG tablet TAKE 1 TABLET BY MOUTH  TWICE DAILY   tamsulosin (FLOMAX) 0.4 MG CAPS capsule TAKE 2 CAPSULES(0.8 MG) BY MOUTH DAILY AFTER SUPPER   Tiotropium Bromide Monohydrate (SPIRIVA RESPIMAT)  2.5 MCG/ACT AERS Inhale 2 puffs into the lungs daily.   varenicline (CHANTIX) 1 MG tablet Take 1 tablet (1 mg total) by mouth 2 (two) times daily.   No facility-administered encounter medications on file as of 05/06/2020.   Multiple attempts have been made.  Patient has been unable to be reached.   Adherence Gaps Total Gaps - All Measures 1  Total Gaps - Star Measures 0 ? Breast Cancer Screening (BCS) 0 ? Controlling High Blood Pressure (CBP) 0 ? Diabetes: HbA1c Control <= 9.0% (CDC-A1c) 0 ? Diabetes: Eye exam (retinal) performed (CDC-Eye) 0 ? Diabetes: Medical attention for nephropathy (CDC-Neph) 0 ? Care for Older Adults (COA): Medication Review 0 ? Care for Older Adults (COA): Pain Assessment 0 ? Colorectal Cancer Screening (COL) Met: Colorectal Cancer Screening Compliant ? Medication Adherence for Cholesterol (Statins) (MAC) Met: Med Compliance CHOL:  100% ? Medication Adherence for Diabetes Medications (MAD) 0 ? Medication Adherence for Hypertension (RAS antogoniststs) ([facility] 0 ? Medication Reconciliation Post-Discharge (MRP) 0 ? Osteoporosis Management in Women Who Had a Fracture (OMW) 0 ? Plan All-Cause Readmissions (PCR) 0 ? Statin Therapy for Patients with Cardiovascular Disease (SPC) 0 ? Statin Use in Persons with Diabetes (SUPD) 0 ? Statin Use in Persons with Diabetes (SUPD) 0 Visits: Wellness Bundle ([facility] Met: AWV performed Visits: Annual Wellness Visit (AWV) Not Met: AWV not performed Controlling High Blood Pressure (CBP) Met: BP < 140/90 Diabetes: HbA1c Testing (CDC-A1c Testing) 0 Diabetes: Eye exam (retinal) performed (CDC-Eye) 0 Care for Older Adults (COA):  Medication Review 0 Care for Older Adults (COA): Pain Assessment 0 Plan All-Cause Readmissions (PCR) [place], [place] Pharmacist Assistant [phone]

## 2020-05-15 ENCOUNTER — Ambulatory Visit (INDEPENDENT_AMBULATORY_CARE_PROVIDER_SITE_OTHER): Payer: Medicare Other

## 2020-05-15 ENCOUNTER — Telehealth: Payer: Self-pay

## 2020-05-15 ENCOUNTER — Other Ambulatory Visit: Payer: Self-pay

## 2020-05-15 ENCOUNTER — Ambulatory Visit: Payer: Medicare Other | Admitting: Podiatry

## 2020-05-15 DIAGNOSIS — M2042 Other hammer toe(s) (acquired), left foot: Secondary | ICD-10-CM

## 2020-05-15 DIAGNOSIS — M722 Plantar fascial fibromatosis: Secondary | ICD-10-CM

## 2020-05-15 DIAGNOSIS — M2041 Other hammer toe(s) (acquired), right foot: Secondary | ICD-10-CM

## 2020-05-15 NOTE — Patient Instructions (Signed)

## 2020-05-15 NOTE — Progress Notes (Signed)
  Subjective:  Patient ID: Jacob Graves, male    DOB: 1956-02-04,  MRN: [id number]  Chief Complaint  Patient presents with  . Plantar Fasciitis    Flare up- pt states he has history of P.F- mentioned there is a tender spot from arch to heel and lateral side of foot.   65 y.o. male presents with the above complaint. History confirmed with patient.   Objective:  Physical Exam: warm, good capillary refill, no trophic changes or ulcerative lesions, normal DP and PT pulses and normal sensory exam. Right Foot: Mild POP medial calc tuber. POP 5th met base. Hammertoes   No images are attached to the encounter.  Radiographs: X-ray of the right foot: no fracture, dislocation, swelling or degenerative changes noted Assessment:   1. Plantar fasciitis   2. Hammertoes of both feet    Plan:  Patient was evaluated and treated and all questions answered.  Plantar Fasciitis -Educated patient on stretching and icing of the affected limb  -Pain not significant enough for injection.  Hammertoes of Right foot -Educated on etiology -Discussed these may worsen with time but stretching may help prevent this  Return if symptoms worsen or fail to improve.

## 2020-05-15 NOTE — Telephone Encounter (Cosign Needed Addendum)
  Chronic Care Management   Note  05/15/2020 Name: Jacob Graves MRN: [id number] DOB: 03/30/1955   Patient called reporting that he will begin seeing Dr. Megan Salon for Suboxone 05/22/2020. He was informed upon scheduling that Dr. Megan Salon would not be prescribing gabapentin. Patient reports that gabapentin helps control his pain and he is established on this regimen.   Pharmacist consulted Dr. Tobie Poet and Dr. Hale Bogus nurse. Per Dawana at Dr. Hale Bogus office, Dr. Megan Salon is ok with patient being on both but will on prescribe Suboxone. Dr. Megan Salon has no problem with Dr. Tobie Poet prescribing patient's gabapentin.   Patient reports he will run out of gabapentin 05/21/2020. Pharmacist advised Dr. Tobie Poet.   Dr. Tobie Poet is willing to prescribe gabapentin and requested pharmacist to find out which pharmacy and correct dose.   Patient states that he take Gabapentin 600 mg four times daily. He would like prescription sent to [facility].   Sherre Poot, PharmD, [facility] Clinical Pharmacist [facility] [phone] (office) [phone] (mobile)

## 2020-05-19 ENCOUNTER — Other Ambulatory Visit: Payer: Self-pay | Admitting: Family Medicine

## 2020-05-19 MED ORDER — GABAPENTIN 600 MG PO TABS
600.0000 mg | ORAL_TABLET | Freq: Four times a day (QID) | ORAL | 3 refills | Status: DC
Start: 2020-05-19 — End: 2020-09-08

## 2020-05-21 ENCOUNTER — Telehealth: Payer: Self-pay

## 2020-05-21 NOTE — Progress Notes (Signed)
    Chronic Care Management Pharmacy Assistant   Name: Jacob Graves  MRN: [id number] DOB: 08-Feb-1956  Reason for Encounter: Adherence review  Medications: Outpatient Encounter Medications as of 05/21/2020  Medication Sig  . albuterol (VENTOLIN HFA) 108 (90 Base) MCG/ACT inhaler Inhale 2 puffs into the lungs every 6 (six) hours as needed for wheezing or shortness of breath.  . Aspirin-Acetaminophen-Caffeine 500-325-65 MG PACK Take 325 mg by mouth 2 (two) times daily as needed.  Marland Kitchen atorvastatin (LIPITOR) 80 MG tablet TAKE 1 TABLET BY MOUTH  DAILY  . Buprenorphine HCl-Naloxone HCl 8-2 MG FILM Place under the tongue.  . fluticasone (FLONASE) 50 MCG/ACT nasal spray Place 1 spray into both nostrils daily.  Marland Kitchen gabapentin (NEURONTIN) 600 MG tablet Take 1 tablet (600 mg total) by mouth in the morning, at noon, in the evening, and at bedtime.  Marland Kitchen ibuprofen (ADVIL) 800 MG tablet TAKE 1 TABLET(800 MG) BY MOUTH THREE TIMES DAILY WITH FOOD  . linaclotide (LINZESS) 145 MCG CAPS capsule Take 1 capsule (145 mcg total) by mouth daily before breakfast.  . montelukast (SINGULAIR) 10 MG tablet TAKE 1 TABLET(10 MG) BY MOUTH AT BEDTIME  . omeprazole (PRILOSEC) 40 MG capsule TAKE 1 CAPSULE BY MOUTH  DAILY BEFORE A MEAL  . ondansetron (ZOFRAN) 4 MG tablet TAKE ONE TABLET BY MOUTH EVERY 8 HOURS AS NEEDED FOR NAUSEA/VOMITING  . polyethylene glycol (MIRALAX MIX-IN PAX) 17 g packet Take 17 g by mouth 2 (two) times daily.  . polyethylene glycol powder (GLYCOLAX/MIRALAX) 17 GM/SCOOP powder Take 17 g by mouth daily.  . propranolol (INDERAL) 60 MG tablet TAKE 1 TABLET BY MOUTH  TWICE DAILY  . tamsulosin (FLOMAX) 0.4 MG CAPS capsule TAKE 2 CAPSULES(0.8 MG) BY MOUTH DAILY AFTER SUPPER  . Tiotropium Bromide Monohydrate (SPIRIVA RESPIMAT) 2.5 MCG/ACT AERS Inhale 2 puffs into the lungs daily.  . varenicline (CHANTIX) 1 MG tablet Take 1 tablet (1 mg total) by mouth 2 (two) times daily.   No facility-administered encounter  medications on file as of 05/21/2020.    Chart review noted the patients next appointment is: Donette Larry, CPP 10/09/20  Adherence gaps Total Gaps - All Measures 1  Total Gaps - Star Measures 0 ? Breast Cancer Screening (BCS) 0 ? Controlling High Blood Pressure (CBP) 0 ? Diabetes: HbA1c Control <= 9.0% (CDC-A1c) 0 ? Diabetes: Eye exam (retinal) performed (CDC-Eye) 0 ? Diabetes: Medical attention for nephropathy (CDC-Neph) 0 ? Care for Older Adults (COA): Medication Review 0 ? Care for Older Adults (COA): Pain Assessment 0 ? Colorectal Cancer Screening (COL) Met: Colorectal Cancer Screening Compliant ? Medication Adherence for Cholesterol (Statins) (MAC) Met: Med Compliance CHOL:  100% ? Medication Adherence for Diabetes Medications (MAD) 0 ? Medication Adherence for Hypertension (RAS antogoniststs) [facility]) 0 ? Medication Reconciliation Post-Discharge (MRP) 0 ? Osteoporosis Management in Women Who Had a Fracture (OMW) 0 ? Plan All-Cause Readmissions (PCR) 0 ? Statin Therapy for Patients with Cardiovascular Disease (SPC) 0 ? Statin Use in Persons with Diabetes (SUPD) 0 ? Statin Use in Persons with Diabetes (SUPD) 0 Visits: Wellness Bundle ([facility]) Met: AWV performed Visits: Annual Wellness Visit (AWV) Not Met: AWV not performed Controlling High Blood Pressure (CBP) Met: BP < 140/90 Diabetes: HbA1c Testing (CDC-A1c Testing) 0 Diabetes: Eye exam (retinal) performed (CDC-Eye) 0 Care for Older Adults (COA): Medication Review 0 Care for Older Adults (COA): Pain Assessment 0 Plan All-Cause Readmissions (PCR) [place], [place] Pharmacist Assistant [phone]

## 2020-05-22 DIAGNOSIS — Z79899 Other long term (current) drug therapy: Secondary | ICD-10-CM | POA: Diagnosis not present

## 2020-05-27 ENCOUNTER — Other Ambulatory Visit: Payer: Self-pay | Admitting: Family Medicine

## 2020-06-03 ENCOUNTER — Other Ambulatory Visit: Payer: Self-pay | Admitting: Family Medicine

## 2020-06-03 DIAGNOSIS — R112 Nausea with vomiting, unspecified: Secondary | ICD-10-CM

## 2020-06-05 DIAGNOSIS — Z79899 Other long term (current) drug therapy: Secondary | ICD-10-CM | POA: Diagnosis not present

## 2020-06-18 ENCOUNTER — Other Ambulatory Visit: Payer: Self-pay | Admitting: Family Medicine

## 2020-06-18 DIAGNOSIS — J301 Allergic rhinitis due to pollen: Secondary | ICD-10-CM

## 2020-06-30 ENCOUNTER — Encounter: Payer: Self-pay | Admitting: Nurse Practitioner

## 2020-06-30 ENCOUNTER — Ambulatory Visit (INDEPENDENT_AMBULATORY_CARE_PROVIDER_SITE_OTHER): Payer: Medicare Other | Admitting: Nurse Practitioner

## 2020-06-30 ENCOUNTER — Other Ambulatory Visit: Payer: Self-pay

## 2020-06-30 VITALS — BP 136/78 | HR 76 | Temp 97.9°F | Ht 68.0 in | Wt 178.0 lb

## 2020-06-30 DIAGNOSIS — L03116 Cellulitis of left lower limb: Secondary | ICD-10-CM

## 2020-06-30 DIAGNOSIS — S8012XA Contusion of left lower leg, initial encounter: Secondary | ICD-10-CM

## 2020-06-30 DIAGNOSIS — W19XXXA Unspecified fall, initial encounter: Secondary | ICD-10-CM | POA: Diagnosis not present

## 2020-06-30 MED ORDER — SULFAMETHOXAZOLE-TRIMETHOPRIM 800-160 MG PO TABS: 1.0000 | ORAL_TABLET | Freq: Two times a day (BID) | ORAL | 0 refills | Status: DC

## 2020-06-30 NOTE — Patient Instructions (Signed)
Take Bactrim-DS twice daily for 10 days We will call you with orthopedic referral appointment Fall precautions at home Follow-up as needed  Fall Prevention in the Home, Adult Falls can cause injuries and can affect people from all age groups. There are many simple things that you can do to make your home safe and to help prevent falls. Ask for help when making these changes, if needed. What actions can I take to prevent falls? General instructions  Use good lighting in all rooms. Replace any light bulbs that burn out.  Turn on lights if it is dark. Use night-lights.  Place frequently used items in easy-to-reach places. Lower the shelves around your home if necessary.  Set up furniture so that there are clear paths around it. Avoid moving your furniture around.  Remove throw rugs and other tripping hazards from the floor.  Avoid walking on wet floors.  Fix any uneven floor surfaces.  Add color or contrast paint or tape to grab bars and handrails in your home. Place contrasting color strips on the first and last steps of stairways.  When you use a stepladder, make sure that it is completely opened and that the sides are firmly locked. Have someone hold the ladder while you are using it. Do not climb a closed stepladder.  Be aware of any and all pets. What can I do in the bathroom?  Keep the floor dry. Immediately clean up any water that spills onto the floor.  Remove soap buildup in the tub or shower on a regular basis.  Use non-skid mats or decals on the floor of the tub or shower.  Attach bath mats securely with double-sided, non-slip rug tape.  If you need to sit down while you are in the shower, use a plastic, non-slip stool.  Install grab bars by the toilet and in the tub and shower. Do not use towel bars as grab bars.      What can I do in the bedroom?  Make sure that a bedside light is easy to reach.  Do not use oversized bedding that drapes onto the  floor.  Have a firm chair that has side arms to use for getting dressed. What can I do in the kitchen?  Clean up any spills right away.  If you need to reach for something above you, use a sturdy step stool that has a grab bar.  Keep electrical cables out of the way.  Do not use floor polish or wax that makes floors slippery. If you must use wax, make sure that it is non-skid floor wax. What can I do in the stairways?  Do not leave any items on the stairs.  Make sure that you have a light switch at the top of the stairs and the bottom of the stairs. Have them installed if you do not have them.  Make sure that there are handrails on both sides of the stairs. Fix handrails that are broken or loose. Make sure that handrails are as long as the stairways.  Install non-slip stair treads on all stairs in your home.  Avoid having throw rugs at the top or bottom of stairways, or secure the rugs with carpet tape to prevent them from moving.  Choose a carpet design that does not hide the edge of steps on the stairway.  Check any carpeting to make sure that it is firmly attached to the stairs. Fix any carpet that is loose or worn. What can I do on  the outside of my home?  Use bright outdoor lighting.  Regularly repair the edges of walkways and driveways and fix any cracks.  Remove high doorway thresholds.  Trim any shrubbery on the main path into your home.  Regularly check that handrails are securely fastened and in good repair. Both sides of any steps should have handrails.  Install guardrails along the edges of any raised decks or porches.  Clear walkways of debris and clutter, including tools and rocks.  Have leaves, snow, and ice cleared regularly.  Use sand or salt on walkways during winter months.  In the garage, clean up any spills right away, including grease or oil spills. What other actions can I take?  Wear closed-toe shoes that fit well and support your feet. Wear  shoes that have rubber soles or low heels.  Use mobility aids as needed, such as canes, walkers, scooters, and crutches.  Review your medicines with your health care provider. Some medicines can cause dizziness or changes in blood pressure, which increase your risk of falling. Talk with your health care provider about other ways that you can decrease your risk of falls. This may include working with a physical therapist or trainer to improve your strength, balance, and endurance. Where to find more information  Centers for Disease Control and Prevention, STEADI: WebmailGuide.co.za  [employer] on Aging: BrainJudge.co.uk Contact a health care provider if:  You are afraid of falling at home.  You feel weak, drowsy, or dizzy at home.  You fall at home. Summary  There are many simple things that you can do to make your home safe and to help prevent falls.  Ways to make your home safe include removing tripping hazards and installing grab bars in the bathroom.  Ask for help when making these changes in your home. This information is not intended to replace advice given to you by your health care provider. Make sure you discuss any questions you have with your health care provider. Document Revised: 01/28/2017 Document Reviewed: 09/30/2016 Elsevier Patient Education  2021 [place]. Cellulitis, Adult  Cellulitis is a skin infection. The infected area is often warm, red, swollen, and sore. It occurs most often in the arms and lower legs. It is very important to get treated for this condition. What are the causes? This condition is caused by bacteria. The bacteria enter through a break in the skin, such as a cut, burn, insect bite, open sore, or crack. What increases the risk? This condition is more likely to occur in people who:  Have a weak body defense system (immune system).  Have open cuts, burns, bites, or scrapes on the skin.  Are older than 63 years of  age.  Have a blood sugar problem (diabetes).  Have a long-lasting (chronic) liver disease (cirrhosis) or kidney disease.  Are very overweight (obese).  Have a skin problem, such as: ? Itchy rash (eczema). ? Slow movement of blood in the veins (venous stasis). ? Fluid buildup below the skin (edema).  Have been treated with high-energy rays (radiation).  Use IV drugs. What are the signs or symptoms? Symptoms of this condition include:  Skin that is: ? Red. ? Streaking. ? Spotting. ? Swollen. ? Sore or painful when you touch it. ? Warm.  A fever.  Chills.  Blisters. How is this diagnosed? This condition is diagnosed based on:  Medical history.  Physical exam.  Blood tests.  Imaging tests. How is this treated? Treatment for this condition may include:  Medicines to treat infections or allergies.  Home care, such as: ? Rest. ? Placing cold or warm cloths (compresses) on the skin.  Hospital care, if the condition is very bad. Follow these instructions at home: Medicines  Take over-the-counter and prescription medicines only as told by your doctor.  If you were prescribed an antibiotic medicine, take it as told by your doctor. Do not stop taking it even if you start to feel better. General instructions  Drink enough fluid to keep your pee (urine) pale yellow.  Do not touch or rub the infected area.  Raise (elevate) the infected area above the level of your heart while you are sitting or lying down.  Place cold or warm cloths on the area as told by your doctor.  Keep all follow-up visits as told by your doctor. This is important.   Contact a doctor if:  You have a fever.  You do not start to get better after 1-2 days of treatment.  Your bone or joint under the infected area starts to hurt after the skin has healed.  Your infection comes back. This can happen in the same area or another area.  You have a swollen bump in the area.  You have new  symptoms.  You feel ill and have muscle aches and pains. Get help right away if:  Your symptoms get worse.  You feel very sleepy.  You throw up (vomit) or have watery poop (diarrhea) for a long time.  You see red streaks coming from the area.  Your red area gets larger.  Your red area turns dark in color. These symptoms may represent a serious problem that is an emergency. Do not wait to see if the symptoms will go away. Get medical help right away. Call your local emergency services (911 in the [place] Do not drive yourself to the hospital. Summary  Cellulitis is a skin infection. The area is often warm, red, swollen, and sore.  This condition is treated with medicines, rest, and cold and warm cloths.  Take all medicines only as told by your doctor.  Tell your doctor if symptoms do not start to get better after 1-2 days of treatment. This information is not intended to replace advice given to you by your health care provider. Make sure you discuss any questions you have with your health care provider. Document Revised: 07/07/2017 Document Reviewed: 07/07/2017 Elsevier Patient Education  2021 [place]. Hematoma A hematoma is a collection of blood. A hematoma can happen:  Under the skin.  In an organ.  In a body space.  In a joint space.  In other tissues. The blood can thicken (clot) to form a lump that you can see and feel. The lump is often hard and may become sore and tender. The lump can be very small or very big. Most hematomas get better in a few days to weeks. However, some hematomas may be serious and need medical care. What are the causes? This condition is caused by:  An injury.  Blood that leaks under the skin.  Problems from surgeries.  Medical conditions that cause bleeding or bruising. What increases the risk? You are more likely to develop this condition if:  You are an older adult.  You use medicines that thin your blood. What are the  signs or symptoms? Symptoms depend on where the hematoma is in your body.  If the hematoma is under the skin, there is: ? A firm lump on the body. ?  Pain and tenderness in the area. ? Bruising. The skin above the lump may be blue, dark blue, purple-red, or yellowish.  If the hematoma is deep in the tissues or body spaces, there may be: ? Blood in the stomach. This may cause pain in the belly (abdomen), weakness, passing out (fainting), and shortness of breath. ? Blood in the head. This may cause a headache, weakness, trouble speaking or understanding speech, or passing out.   How is this diagnosed? This condition is diagnosed based on:  Your medical history.  A physical exam.  Imaging tests, such as ultrasound or CT scan.  Blood tests. How is this treated? Treatment depends on the cause, size, and location of the hematoma. Treatment may include:  Doing nothing. Many hematomas go away on their own without treatment.  Surgery or close monitoring. This may be needed for large hematomas or hematomas that affect the body's organs.  Medicines. These may be given if a medical condition caused the hematoma. Follow these instructions at home: Managing pain, stiffness, and swelling  If told, put ice on the area. ? Put ice in a plastic bag. ? Place a towel between your skin and the bag. ? Leave the ice on for 20 minutes, 2-3 times a day for the first two days.  If told, put heat on the affected area after putting ice on the area for two days. Use the heat source that your doctor tells you to use. This could be a moist heat pack or a heating pad. To do this: ? Place a towel between your skin and the heat source. ? Leave the heat on for 20-30 minutes. ? Remove the heat if your skin turns bright red. This is very important if you are unable to feel pain, heat, or cold. You may have a greater risk of getting burned.  Raise (elevate) the affected area above the level of your heart while you  are sitting or lying down.  Wrap the affected area with an elastic bandage, if told by your doctor. Do not wrap the bandage too tightly.  If your hematoma is on a leg or foot and is painful, your doctor may give you crutches. Use them as told by your doctor.   General instructions  Take over-the-counter and prescription medicines only as told by your doctor.  Keep all follow-up visits as told by your doctor. This is important. Contact a doctor if:  You have a fever.  The swelling or bruising gets worse.  You start to get more hematomas. Get help right away if:  Your pain gets worse.  Your pain is not getting better with medicine.  Your skin over the hematoma breaks or starts to bleed.  Your hematoma is in your chest or belly and you: ? Pass out. ? Feel weak. ? Become short of breath.  You have a hematoma on your scalp that is caused by a fall or injury, and you: ? Have a headache that gets worse. ? Have trouble speaking or understanding speech. ? Become less alert or you pass out. Summary  A hematoma is a collection of blood in any part of your body.  Most hematomas get better on their own in a few days to weeks. Some may need medical care.  Follow instructions from your doctor about how to care for your hematoma.  Contact a doctor if the swelling or bruising gets worse, or if you are short of breath. This information is not intended to replace  advice given to you by your health care provider. Make sure you discuss any questions you have with your health care provider. Document Revised: 07/21/2017 Document Reviewed: 07/21/2017 Elsevier Patient Education  2021 [employer].

## 2020-06-30 NOTE — Progress Notes (Signed)
Acute Office Visit  Subjective:    Patient ID: Jacob Graves, male    DOB: 1955/08/20, 65 y.o.   MRN: [id number]  Chief Complaint  Patient presents with  . Knot on left shin        HPI Patient is in today for evaluation of left shin wound s/p fall 4-days ago.His spouse is present with him. He has experienced two falls within the past week. The first fall occurred on 06/24/20 without major injury. He fell a second time on 06/26/20. States he fell through a decayed wooden board on deck during renovations. Last tetanus shot was 2016. He sustained abrasions and bruising to bilateral upper and lower extremities. He has an abrasion and large knot to left anterior shin. Spouse states she cleansed wound with alcohol, peroxide, and betadine. Wound has been dressed with antibiotic ointment and covered with bandage for four days. He states the knot has not decreased in size since accident.   Past Medical History:  Diagnosis Date  . Apnea   . Arthritis   . Arthritis    spinal arthritis  . Cancer (HCC)    melanoma, basal cell  . Centrilobular emphysema ([place]) 07/08/2015  . Depression   . Emphysema of lung (HCC)    COPD  . GERD (gastroesophageal reflux disease)   . Hypercholesterolemia   . Hypertension   . Impaired fasting glucose   . Kyphosis    mild  . Migraine   . Neuropathy   . Osteoarthritis   . Other testicular dysfunction   . Scoliosis   . Sleep apnea   . Solitary pulmonary nodule   . Tinnitus     Past Surgical History:  Procedure Laterality Date  . COLONOSCOPY  2016   Colon polyps. Tubular Adenoma.  . TONSILLECTOMY      Family History  Problem Relation Age of Onset  . Heart disease Father   . Stroke Father   . Breast cancer Maternal Grandmother   . Leukemia Mother   . Skin cancer Mother   . Diabetes Mother   . Stroke Mother   . Hypertension Mother   . Skin cancer Brother   . Cancer Brother        Melanoma  . Hypertension Brother   . Colon cancer Neg Hx      Social History   Socioeconomic History  . Marital status: Married    Spouse name: Baker Janus  . Number of children: 2  . Years of education: 65  . Highest education level: Not on file  Occupational History  . Occupation: disabled  Tobacco Use  . Smoking status: Current Every Day Smoker    Packs/day: 0.75    Types: Cigarettes    Start date: 35  . Smokeless tobacco: Never Used  . Tobacco comment: currently smoking 3/4 ppd. For years smoked 1 1/2 ppd   Vaping Use  . Vaping Use: Never used  Substance and Sexual Activity  . Alcohol use: No    Alcohol/week: 0.0 standard drinks  . Drug use: No  . Sexual activity: Not on file  Other Topics Concern  . Not on file  Social History Narrative   Lives with wife   Caffeine- coffee, 3 cups daily   Social Determinants of Health   Financial Resource Strain: Not on file  Food Insecurity: No Food Insecurity  . Worried About Charity fundraiser in the Last Year: Never true  . Ran Out of Food in the Last Year: Never true  Transportation Needs: No Transportation Needs  . Lack of Transportation (Medical): No  . Lack of Transportation (Non-Medical): No  Physical Activity: Not on file  Stress: Not on file  Social Connections: Not on file  Intimate Partner Violence: Not on file    Outpatient Medications Prior to Visit  Medication Sig Dispense Refill  . albuterol (VENTOLIN HFA) 108 (90 Base) MCG/ACT inhaler Inhale 2 puffs into the lungs every 6 (six) hours as needed for wheezing or shortness of breath. 8 g 3  . Aspirin-Acetaminophen-Caffeine 500-325-65 MG PACK Take 325 mg by mouth 2 (two) times daily as needed.    Marland Kitchen atorvastatin (LIPITOR) 80 MG tablet TAKE 1 TABLET BY MOUTH  DAILY 90 tablet 1  . Buprenorphine HCl-Naloxone HCl 8-2 MG FILM Place under the tongue.    . fluticasone (FLONASE) 50 MCG/ACT nasal spray INHALE 1 SPRAY IN EACH NOSTRIL EVERY DAY 16 g 2  . gabapentin (NEURONTIN) 600 MG tablet Take 1 tablet (600 mg total) by mouth in the  morning, at noon, in the evening, and at bedtime. 120 tablet 3  . ibuprofen (ADVIL) 800 MG tablet TAKE 1 TABLET(800 MG) BY MOUTH THREE TIMES DAILY WITH FOOD 90 tablet 1  . linaclotide (LINZESS) 145 MCG CAPS capsule Take 1 capsule (145 mcg total) by mouth daily before breakfast. 30 capsule 3  . montelukast (SINGULAIR) 10 MG tablet TAKE 1 TABLET(10 MG) BY MOUTH AT BEDTIME 90 tablet 3  . omeprazole (PRILOSEC) 40 MG capsule TAKE 1 CAPSULE BY MOUTH  DAILY BEFORE A MEAL 90 capsule 1  . ondansetron (ZOFRAN) 4 MG tablet TAKE ONE TABLET BY MOUTH EVERY 8 HOURS AS NEEDED FOR NAUSEA/VOMITING 60 tablet 0  . polyethylene glycol (MIRALAX MIX-IN PAX) 17 g packet Take 17 g by mouth 2 (two) times daily. 200 each 3  . polyethylene glycol powder (GLYCOLAX/MIRALAX) 17 GM/SCOOP powder Take 17 g by mouth daily.    . propranolol (INDERAL) 60 MG tablet TAKE 1 TABLET BY MOUTH  TWICE DAILY 180 tablet 1  . tamsulosin (FLOMAX) 0.4 MG CAPS capsule TAKE 2 CAPSULES(0.8 MG) BY MOUTH DAILY AFTER SUPPER 180 capsule 1  . Tiotropium Bromide Monohydrate (SPIRIVA RESPIMAT) 2.5 MCG/ACT AERS Inhale 2 puffs into the lungs daily. 4 g 2  . varenicline (CHANTIX) 1 MG tablet Take 1 tablet (1 mg total) by mouth 2 (two) times daily. 60 tablet 3   No facility-administered medications prior to visit.    Allergies  Allergen Reactions  . Codeine Nausea Only and Nausea And Vomiting  . Duloxetine     Loss of appetite  . Fentanyl     Fatigue   . Fluoxetine     Agitation  . Ipratropium-Albuterol     Myalgia  . Penicillins Itching and Other (See Comments)    As child   . Zoloft [Sertraline]     Sweating     Review of Systems  Constitutional: Negative.   HENT: Negative.   Eyes: Negative.   Respiratory: Negative.   Cardiovascular: Negative.   Gastrointestinal: Negative.   Endocrine: Negative.   Musculoskeletal: Positive for arthralgias and myalgias.  Allergic/Immunologic: Negative.   Neurological: Positive for weakness  (bilateral lower legs) and numbness (neuropathy bilateral lower legs).  Hematological: Negative.   Psychiatric/Behavioral: Negative.        Objective:    Physical Exam Vitals reviewed.  Constitutional:      Appearance: Normal appearance.  HENT:     Head: Normocephalic.  Pulmonary:     Effort: Pulmonary effort  is normal.  Musculoskeletal:        General: Tenderness and signs of injury present.       Arms:       Legs:     Comments: Hematoma approximately 5 mm in diameter to left anterior shin. Abrasion approximately 3 mm in diameter without odor or drainage to left anterior shin; slight erythema noted around hematoma and abrasion. Multiple bruises noted to bilateral upper and lower extremities  Skin:    Capillary Refill: Capillary refill takes less than 2 seconds.  Neurological:     General: No focal deficit present.     Mental Status: He is alert.     BP 136/78 (BP Location: Left Arm, Patient Position: Sitting)   Pulse 76   Temp 97.9 F (36.6 C) (Temporal)   Ht 5\' 8"  (1.727 m)   Wt 178 lb (80.7 kg)   SpO2 94%   BMI 27.06 kg/m  Wt Readings from Last 3 Encounters:  06/30/20 178 lb (80.7 kg)  03/21/20 179 lb (81.2 kg)  12/19/19 179 lb 3.2 oz (81.3 kg)    Health Maintenance Due  Topic Date Due  . Hepatitis C Screening  Never done  . COVID-19 Vaccine (1) Never done  . HIV Screening  Never done     Lab Results  Component Value Date   TSH 1.090 12/19/2019   Lab Results  Component Value Date   WBC 7.3 12/19/2019   HGB 13.8 12/19/2019   HCT 41.4 12/19/2019   MCV 92 12/19/2019   PLT 198 12/19/2019   Lab Results  Component Value Date   NA 141 12/19/2019   K 5.0 12/19/2019   CO2 28 12/19/2019   GLUCOSE 93 12/19/2019   BUN 11 12/19/2019   CREATININE 0.72 (L) 12/19/2019   BILITOT 0.5 12/19/2019   ALKPHOS 134 (H) 12/19/2019   AST 9 12/19/2019   ALT 13 12/19/2019   PROT 6.4 12/19/2019   ALBUMIN 4.4 12/19/2019   CALCIUM 9.0 12/19/2019   GFR 95.22  07/15/2017   Lab Results  Component Value Date   CHOL 121 12/19/2019   Lab Results  Component Value Date   HDL 39 (L) 12/19/2019   Lab Results  Component Value Date   LDLCALC 68 12/19/2019   Lab Results  Component Value Date   TRIG 67 12/19/2019   Lab Results  Component Value Date   CHOLHDL 3.1 12/19/2019        Assessment & Plan:   1. Hematoma of leg, left, initial encounter - Ambulatory referral to Orthopedic Surgery  2. Fall, initial encounter -fall precautions at home   3. Left leg cellulitis - sulfamethoxazole-trimethoprim (BACTRIM DS) 800-160 MG tablet; Take 1 tablet by mouth 2 (two) times daily.  Dispense: 20 tablet; Refill: 0    Take Bactrim-DS twice daily for 10 days We will call you with orthopedic referral appointment Fall precautions at home Follow-up as needed   Follow-up: as needed  Signed, Rip Harbour, NP  06/30/20 at 1534

## 2020-07-02 DIAGNOSIS — S80812A Abrasion, left lower leg, initial encounter: Secondary | ICD-10-CM | POA: Diagnosis not present

## 2020-07-02 DIAGNOSIS — S8012XA Contusion of left lower leg, initial encounter: Secondary | ICD-10-CM | POA: Diagnosis not present

## 2020-07-02 DIAGNOSIS — L03116 Cellulitis of left lower limb: Secondary | ICD-10-CM | POA: Diagnosis not present

## 2020-07-08 ENCOUNTER — Other Ambulatory Visit: Payer: Self-pay | Admitting: Physician Assistant

## 2020-07-08 DIAGNOSIS — R112 Nausea with vomiting, unspecified: Secondary | ICD-10-CM

## 2020-07-08 DIAGNOSIS — Z79899 Other long term (current) drug therapy: Secondary | ICD-10-CM | POA: Diagnosis not present

## 2020-07-09 ENCOUNTER — Other Ambulatory Visit: Payer: Self-pay

## 2020-07-09 DIAGNOSIS — R112 Nausea with vomiting, unspecified: Secondary | ICD-10-CM

## 2020-07-09 MED ORDER — ONDANSETRON HCL 4 MG PO TABS: ORAL_TABLET | ORAL | 0 refills | Status: DC

## 2020-07-19 DIAGNOSIS — D1801 Hemangioma of skin and subcutaneous tissue: Secondary | ICD-10-CM | POA: Diagnosis not present

## 2020-07-19 DIAGNOSIS — L821 Other seborrheic keratosis: Secondary | ICD-10-CM | POA: Diagnosis not present

## 2020-07-19 DIAGNOSIS — D225 Melanocytic nevi of trunk: Secondary | ICD-10-CM | POA: Diagnosis not present

## 2020-07-19 DIAGNOSIS — R233 Spontaneous ecchymoses: Secondary | ICD-10-CM | POA: Diagnosis not present

## 2020-07-22 ENCOUNTER — Other Ambulatory Visit: Payer: Self-pay

## 2020-07-22 ENCOUNTER — Other Ambulatory Visit: Payer: Self-pay | Admitting: Family Medicine

## 2020-07-22 MED ORDER — TAMSULOSIN HCL 0.4 MG PO CAPS: ORAL_CAPSULE | ORAL | 1 refills | Status: DC

## 2020-07-25 ENCOUNTER — Other Ambulatory Visit: Payer: Self-pay | Admitting: Family Medicine

## 2020-08-04 ENCOUNTER — Other Ambulatory Visit: Payer: Self-pay | Admitting: Internal Medicine

## 2020-08-04 ENCOUNTER — Other Ambulatory Visit: Payer: Self-pay | Admitting: Family Medicine

## 2020-08-05 ENCOUNTER — Telehealth: Payer: Self-pay

## 2020-08-05 ENCOUNTER — Other Ambulatory Visit: Payer: Self-pay

## 2020-08-05 DIAGNOSIS — Z79899 Other long term (current) drug therapy: Secondary | ICD-10-CM | POA: Diagnosis not present

## 2020-08-05 MED ORDER — MONTELUKAST SODIUM 10 MG PO TABS: ORAL_TABLET | ORAL | 3 refills | Status: DC

## 2020-08-05 NOTE — Progress Notes (Signed)
    Chronic Care Management Pharmacy Assistant   Name: Jacob Graves  MRN: [id number] DOB: October 26, 1955  Attempted to reach patient x3.   Reason for Encounter: General Adherence call    Recent office visits:  06/30/2020: [name], NP (PCP) / left leg hematoma and cellulitis secondary to fall. / added Sulfamethoxazole-Trimethoprim 800-160mg . twice daily.  Recent consult visits:  None noted since last CCM visit   Hospital visits:  None noted since last CCM visit   Medications: Outpatient Encounter Medications as of 08/05/2020  Medication Sig   albuterol (VENTOLIN HFA) 108 (90 Base) MCG/ACT inhaler Inhale 2 puffs into the lungs every 6 (six) hours as needed for wheezing or shortness of breath.   Aspirin-Acetaminophen-Caffeine 500-325-65 MG PACK Take 325 mg by mouth 2 (two) times daily as needed.   atorvastatin (LIPITOR) 80 MG tablet TAKE 1 TABLET BY MOUTH  DAILY   Buprenorphine HCl-Naloxone HCl 8-2 MG FILM Place under the tongue.   fluticasone (FLONASE) 50 MCG/ACT nasal spray INHALE 1 SPRAY IN EACH NOSTRIL EVERY DAY   gabapentin (NEURONTIN) 600 MG tablet Take 1 tablet (600 mg total) by mouth in the morning, at noon, in the evening, and at bedtime.   ibuprofen (ADVIL) 800 MG tablet TAKE 1 TABLET(800 MG) BY MOUTH THREE TIMES DAILY WITH FOOD   linaclotide (LINZESS) 145 MCG CAPS capsule Take 1 capsule (145 mcg total) by mouth daily before breakfast.   montelukast (SINGULAIR) 10 MG tablet TAKE 1 TABLET(10 MG) BY MOUTH AT BEDTIME   omeprazole (PRILOSEC) 40 MG capsule TAKE 1 CAPSULE BY MOUTH  DAILY BEFORE A MEAL   ondansetron (ZOFRAN) 4 MG tablet TAKE ONE TABLET BY MOUTH EVERY 8 HOURS AS NEEDED FOR NAUSEA/VOMITING   ondansetron (ZOFRAN) 4 MG tablet TAKE ONE TABLET BY MOUTH EVERY 8 HOURS AS NEEDED FOR NAUSEA/VOMITING   polyethylene glycol (MIRALAX MIX-IN PAX) 17 g packet Take 17 g by mouth 2 (two) times daily.   polyethylene glycol powder (GLYCOLAX/MIRALAX) 17 GM/SCOOP powder Take 17 g by  mouth daily.   propranolol (INDERAL) 60 MG tablet TAKE 1 TABLET BY MOUTH  TWICE DAILY   sulfamethoxazole-trimethoprim (BACTRIM DS) 800-160 MG tablet Take 1 tablet by mouth 2 (two) times daily.   tamsulosin (FLOMAX) 0.4 MG CAPS capsule TAKE 2 CAPSULES(0.8 MG) BY MOUTH DAILY AFTER SUPPER   Tiotropium Bromide Monohydrate (SPIRIVA RESPIMAT) 2.5 MCG/ACT AERS Inhale 2 puffs into the lungs daily.   varenicline (CHANTIX) 1 MG tablet Take 1 tablet (1 mg total) by mouth 2 (two) times daily.   No facility-administered encounter medications on file as of 08/05/2020.     Patient is not > 5 days past due for refill on the following medications per chart history:  [facility] Medications: Medication Name Last Fill Days Supply Atorvastatin 80 mg.  05/31/2020 [place], [place] Clinical Pharmacist Assistant

## 2020-09-02 DIAGNOSIS — Z79899 Other long term (current) drug therapy: Secondary | ICD-10-CM | POA: Diagnosis not present

## 2020-09-03 ENCOUNTER — Other Ambulatory Visit: Payer: Self-pay | Admitting: Family Medicine

## 2020-09-08 ENCOUNTER — Other Ambulatory Visit: Payer: Self-pay | Admitting: Family Medicine

## 2020-09-15 ENCOUNTER — Other Ambulatory Visit: Payer: Self-pay | Admitting: Family Medicine

## 2020-10-07 DIAGNOSIS — Z79899 Other long term (current) drug therapy: Secondary | ICD-10-CM | POA: Diagnosis not present

## 2020-10-08 NOTE — Progress Notes (Signed)
Chronic Care Management Pharmacy Note  10/29/2020 Name:  Jacob Graves MRN:  [id number] DOB:  [birth year]  Summary: Patient's primary Goal is to quit smoking by his [birth year]. He is requesting refill of Chantix. Smoking 10 cigarettes per day right now. His brothers still smoke. After he eats and when he is driving is hardest time to do without a cigarette. He plans to use suckers during this time. Has quit in the past when he turned 52 and quit for 3 months. Drinks regular coffee throughout the day and discussed the potential to reduce caffeine with smoking cessation due to change in metabolism of caffeine.  Reports starting with sore throat that feels like glass and cough. Encouraged patient to schedule a visit if symptoms persist or worsen. Patient denies the need at this time.  Seeing Dr. Megan Salon for pain management now. He has reduced dose of Suboxone to 1 daily and hopes to further reduce it.  Coordinated scheduling a follow-up appointment with Dr. Tobie Poet.    Subjective: Jacob Graves is an 65 y.o. year old male who is a primary patient of Cox, Kirsten, MD.  The CCM team was consulted for assistance with disease management and care coordination needs.    Engaged with patient by telephone for follow up visit in response to provider referral for pharmacy case management and/or care coordination services.   Consent to Services:  The patient was given information about Chronic Care Management services, agreed to services, and gave verbal consent prior to initiation of services.  Please see initial visit note for detailed documentation.   Patient Care Team: Rochel Brome, MD as PCP - General (Family Medicine) Jolene Schimke, MD as Referring Physician (Dermatology) Brand Males, MD as Consulting Physician (Pulmonary Disease) Burnice Logan, [facility] as Pharmacist (Pharmacist)  Recent office visits: 06/30/2020 - referral to ortho. Fall precaustion. Ordered Bactrim DS for 10 days.    Recent consult visits: 06/05/2020 - pain management  Hospital visits: None in previous 6 months   Objective:  Lab Results  Component Value Date   CREATININE 0.76 10/20/2020   BUN 14 10/20/2020   GFR 95.22 07/15/2017   GFRNONAA 100 12/19/2019   GFRAA 116 12/19/2019   NA 142 10/20/2020   K 4.3 10/20/2020   CALCIUM 8.8 10/20/2020   CO2 29 10/20/2020   GLUCOSE 92 10/20/2020    Lab Results  Component Value Date/Time   GFR 95.22 07/15/2017 03:55 PM    Last diabetic Eye exam: No results found for: HMDIABEYEEXA  Last diabetic Foot exam: No results found for: HMDIABFOOTEX   Lab Results  Component Value Date   CHOL 126 10/20/2020   HDL 43 10/20/2020   LDLCALC 65 10/20/2020   TRIG 95 10/20/2020   CHOLHDL 2.9 10/20/2020    Hepatic Function Latest Ref Rng & Units 10/20/2020 12/19/2019 07/15/2017  Total Protein 6.0 - 8.5 g/dL 6.2 6.4 6.5  Albumin 3.8 - 4.8 g/dL 4.2 4.4 4.1  AST 0 - 40 IU/L _0 ALT 0 - 44 IU/L _1 Alk Phosphatase 44 - 121 IU/L 113 134(H) 108  Total Bilirubin 0.0 - 1.2 mg/dL 0.3 0.5 0.4    Lab Results  Component Value Date/Time   TSH 1.780 10/20/2020 10:39 AM   TSH 1.090 12/19/2019 11:09 AM    CBC Latest Ref Rng & Units 10/20/2020 12/19/2019 07/15/2017  WBC 3.4 - 10.8 x10E3/uL 7.7 7.3 6.6  Hemoglobin 13.0 - 17.7 g/dL 13.8 13.8 14.3  Hematocrit  37.5 - 51.0 % 39.6 41.4 41.7  Platelets 150 - 450 x10E3/uL 267 198 199.0    No results found for: VD25OH  Clinical ASCVD: No  The ASCVD Risk score Mikey Bussing DC Jr., et al., 2013) failed to calculate for the following reasons:   The valid total cholesterol range is 130 to 320 mg/dL    Depression screen [facility] 2/9 10/22/2020 12/19/2019 11/14/2019  Decreased Interest 1 1 0  Down, Depressed, Hopeless 0 1 0  PHQ - 2 Score 1 2 0  Altered sleeping 2 0 -  Tired, decreased energy 2 1 -  Change in appetite 1 0 -  Feeling bad or failure about yourself  0 0 -  Trouble concentrating 1 1 -  Moving slowly or  fidgety/restless 0 1 -  Suicidal thoughts 0 0 -  PHQ-9 Score 7 5 -  Difficult doing work/chores Somewhat difficult Not difficult at all -     Other: (CHADS2VASc if Afib, MMRC or CAT for COPD, ACT, DEXA)  Social History   Tobacco Use  Smoking Status Every Day   Packs/day: 0.50   Types: Cigarettes   Start date: 1974  Smokeless Tobacco Never  Tobacco Comments   currently smoking 3/4 ppd. For years smoked 1 1/2 ppd    BP Readings from Last 3 Encounters:  10/20/20 138/70  06/30/20 136/78  03/21/20 124/76   Pulse Readings from Last 3 Encounters:  10/20/20 72  06/30/20 76  03/21/20 65   Wt Readings from Last 3 Encounters:  10/20/20 170 lb 3.2 oz (77.2 kg)  06/30/20 178 lb (80.7 kg)  03/21/20 179 lb (81.2 kg)   BMI Readings from Last 3 Encounters:  10/20/20 25.13 kg/m  06/30/20 27.06 kg/m  03/21/20 27.22 kg/m    Assessment/Interventions: Review of patient past medical history, allergies, medications, health status, including review of consultants reports, laboratory and other test data, was performed as part of comprehensive evaluation and provision of chronic care management services.   SDOH:  (Social Determinants of Health) assessments and interventions performed: Yes  SDOH Screenings   Alcohol Screen: Low Risk    Last Alcohol Screening Score (AUDIT): 0  Depression (PHQ2-9): Medium Risk   PHQ-2 Score: 7  Financial Resource Strain: Not on file  Food Insecurity: Not on file  Housing: Not on file  Physical Activity: Not on file  Social Connections: Not on file  Stress: Not on file  Tobacco Use: High Risk   Smoking Tobacco Use: Every Day   Smokeless Tobacco Use: Never  Transportation Needs: Not on file    [place]  Allergies  Allergen Reactions   Codeine Nausea Only and Nausea And Vomiting   Duloxetine     Loss of appetite   Fentanyl     Fatigue    Fluoxetine     Agitation   Ipratropium-Albuterol     Myalgia   Penicillins Itching and Other  (See Comments)    As child    Zoloft [Sertraline]     Sweating     Medications Reviewed Today     Reviewed by Burnice Logan, [facility] (Pharmacist) on 10/28/20 at 1039  Med List Status: <None>   Medication Order Taking? Sig Documenting Provider Last Dose Status Informant  albuterol (VENTOLIN HFA) 108 (90 Base) MCG/ACT inhaler [id number] Yes Inhale 2 puffs into the lungs every 6 (six) hours as needed for wheezing or shortness of breath. Rochel Brome, MD Taking Active   Aspirin-Acetaminophen-Caffeine [phone] [facility] PACK [id number] Yes Take 325  mg by mouth 2 (two) times daily as needed. [provider] Taking Active   atorvastatin (LIPITOR) 80 MG tablet [id number] Yes TAKE 1 TABLET BY MOUTH  DAILY Cox, Kirsten, MD Taking Active   Buprenorphine HCl-Naloxone HCl 8-2 MG FILM [id number] Yes Place under the tongue. [provider] Taking Active   fluticasone (FLONASE) 50 MCG/ACT nasal spray [id number] Yes INHALE 1 SPRAY IN EACH NOSTRIL EVERY DAY Marge Duncans, PA-C Taking Active   gabapentin (NEURONTIN) 600 MG tablet [id number] Yes TAKE ONE TABLET BY MOUTH EVERY MORNING AT NOON  IN THE EVENING  AND AT BEDTIME Cox, Elnita Maxwell, MD Taking Active   ibuprofen (ADVIL) 800 MG tablet [id number] Yes TAKE 1 TABLET(800 MG) BY MOUTH THREE TIMES DAILY WITH FOOD Rochel Brome, MD Taking Active   LINZESS 145 MCG CAPS capsule [id number] Yes TAKE ONE CAPSULE BY MOUTH EVERY DAY BEFORE BREAKFAST Cox, Kirsten, MD Taking Active   montelukast (SINGULAIR) 10 MG tablet [id number] Yes Take 1 tablet by mouth daily Cox, Kirsten, MD Taking Active   omeprazole (PRILOSEC) 40 MG capsule [id number] Yes TAKE 1 CAPSULE BY MOUTH  DAILY BEFORE A MEAL Cox, Kirsten, MD Taking Active   ondansetron (ZOFRAN) 4 MG tablet [id number] Yes TAKE ONE TABLET BY MOUTH EVERY 8 HOURS AS NEEDED FOR NAUSEA/VOMITING Cox, Kirsten, MD Taking Active   polyethylene glycol powder (GLYCOLAX/MIRALAX) 17 GM/SCOOP powder [id number] Yes Take 17 g by mouth daily.  [provider] Taking Active   propranolol (INDERAL) 60 MG tablet [id number] Yes TAKE 1 TABLET BY MOUTH  TWICE DAILY Cox, Kirsten, MD Taking Active   tamsulosin (FLOMAX) 0.4 MG CAPS capsule [id number] Yes TAKE 2 CAPSULES(0.8 MG) BY MOUTH DAILY AFTER SUPPER Cox, Kirsten, MD Taking Active   Tiotropium Bromide Monohydrate (SPIRIVA RESPIMAT) 2.5 MCG/ACT AERS [id number] Yes Inhale 2 puffs into the lungs daily. Cox, Kirsten, MD Taking Active   varenicline (CHANTIX) 1 MG tablet [id number] Yes Take 1 tablet (1 mg total) by mouth 2 (two) times daily. Rochel Brome, MD Taking Active             Patient Active Problem List   Diagnosis Date Noted   Gastroesophageal reflux disease 09/17/2019   Extremity atherosclerosis with intermittent claudication ([place]) 06/14/2019   Chronic pain syndrome 44/92/0100   Uncomplicated opioid dependence ([place]) 06/14/2019   Chronic midline low back pain without sciatica 06/13/2019   Cigarette nicotine dependence with nicotine-induced disorder 06/13/2019   Non-seasonal allergic rhinitis due to pollen 05/06/2019   Bilateral sensorineural hearing loss 05/06/2019   Essential hypertension, benign 04/09/2019   Tinnitus 08/23/2016   COPD, moderate ([place]) 07/17/2015   Pulmonary nodule 07/08/2015   Smoking history 07/08/2015   Centrilobular emphysema ([place]) 07/08/2015    Immunization History  Administered Date(s) Administered   Influenza,inj,Quad PF,6+ Mos 09/30/2014   Pneumococcal Conjugate-13 07/17/2015   Pneumococcal Polysaccharide-23 10/26/2017   Tdap 10/15/2014    Conditions to be addressed/monitored:  Hypertension, Hyperlipidemia, GERD, COPD, and Tobacco use  Care Plan : [place] Plan  Updates made by Burnice Logan, [place] since 10/29/2020 12:00 AM     Problem: htn and smoking cessation   Priority: High  Onset Date: 10/29/2020     Long-Range Goal: Disease State Management   Start Date: 10/29/2020  Expected End Date: 10/29/2021  This Visit's  Progress: On track  Priority: High  Note:    Current Barriers:  Unable to maintain control of smoking cessation  Pharmacist Clinical Goal(s):  Patient will maintain control of  tobacco use as  evidenced by smoking cessation  through collaboration with PharmD and provider.   Interventions: 1:1 collaboration with Cox, Elnita Maxwell, MD regarding development and update of comprehensive plan of care as evidenced by provider attestation and co-signature Inter-disciplinary care team collaboration (see longitudinal plan of care) Comprehensive medication review performed; medication list updated in electronic medical record  (BP goal <140/90) -Controlled -Current treatment: Propanolol 60 mg bid -Medications previously tried: none reported  -Current home readings: well controlled -Current dietary habits: tries to eat healthy diet -Current exercise habits: enjoys fishing but exercise limited by back pain -Denies hypotensive/hypertensive symptoms -Educated on BP goals and benefits of medications for prevention of heart attack, stroke and kidney damage; Daily salt intake goal < 2300 mg; Exercise goal of 150 minutes per week; -Counseled to monitor BP at home as needed, document, and provide log at future appointments -Counseled on diet and exercise extensively Counseled on benefits of smoking cessation on blood pressure control.   Tobacco use (Goal smoking cessation) -Not ideally controlled -Previous quit attempts: yes -Current treatment  Chatnix 1 mg bid -Patient triggers include: finishing a meal and getting together with brothers -On a scale of 1-10, reports MOTIVATION to quit is 10 -On a scale of 1-10, reports CONFIDENCE in quitting is 10 -Patient set quit date of 10/22/2020. Provided contact information for [facility] (1-800-QUIT-NOW) and encouraged patient to reach out to this group for support. -Counseled on replacing habit of smoking with another activity to keep mind and hands  occupied.   Patient Goals/Self-Care Activities Patient will:  - take medications as prescribed  Follow Up Plan: Telephone follow up appointment with care management team member scheduled for: 10/2020       Medication Assistance: None required.  Patient affirms current coverage meets needs.  Compliance/Adherence/Medication fill history: Care Gaps: AWV  Star-Rating Drugs: Atorvastatin 08/05/2020 - 90 day supply   Patient's preferred pharmacy is:  [place], [address] [place] [address] Phone: [phone] Fax: [phone]  [facility]  ([place], [place] [facility] [place] [address] [address] Phone: [phone] Fax: [phone]  [facility] [phone] - [place], [address] AT [address] [place] [address] Phone: [phone] Fax: [phone]  Uses pill box? No - stores medication together in a basket Pt endorses good compliance  We discussed: Current pharmacy is preferred with insurance plan and patient is satisfied with pharmacy services Patient decided to: Continue current medication management strategy  Care Plan and Follow Up Patient Decision:  Patient agrees to Care Plan and Follow-up.  Plan: Telephone follow up appointment with care management team member scheduled for:  10/2020 with pharmacy team to support smoking cessation efforts.

## 2020-10-09 ENCOUNTER — Other Ambulatory Visit: Payer: Self-pay

## 2020-10-09 ENCOUNTER — Ambulatory Visit (INDEPENDENT_AMBULATORY_CARE_PROVIDER_SITE_OTHER): Payer: Medicare Other

## 2020-10-09 DIAGNOSIS — I10 Essential (primary) hypertension: Secondary | ICD-10-CM | POA: Diagnosis not present

## 2020-10-09 DIAGNOSIS — F17219 Nicotine dependence, cigarettes, with unspecified nicotine-induced disorders: Secondary | ICD-10-CM

## 2020-10-09 DIAGNOSIS — F17210 Nicotine dependence, cigarettes, uncomplicated: Secondary | ICD-10-CM

## 2020-10-09 MED ORDER — VARENICLINE TARTRATE 1 MG PO TABS: 1.0000 mg | ORAL_TABLET | Freq: Two times a day (BID) | ORAL | 3 refills | Status: DC

## 2020-10-09 MED ORDER — MONTELUKAST SODIUM 10 MG PO TABS: ORAL_TABLET | ORAL | 3 refills | Status: DC

## 2020-10-20 ENCOUNTER — Other Ambulatory Visit: Payer: Self-pay

## 2020-10-20 ENCOUNTER — Encounter: Payer: Self-pay | Admitting: Family Medicine

## 2020-10-20 ENCOUNTER — Ambulatory Visit (INDEPENDENT_AMBULATORY_CARE_PROVIDER_SITE_OTHER): Payer: Medicare Other | Admitting: Family Medicine

## 2020-10-20 VITALS — BP 138/70 | HR 72 | Temp 96.1°F | Resp 16 | Ht 69.0 in | Wt 170.2 lb

## 2020-10-20 DIAGNOSIS — M545 Low back pain, unspecified: Secondary | ICD-10-CM | POA: Diagnosis not present

## 2020-10-20 DIAGNOSIS — J449 Chronic obstructive pulmonary disease, unspecified: Secondary | ICD-10-CM

## 2020-10-20 DIAGNOSIS — Z122 Encounter for screening for malignant neoplasm of respiratory organs: Secondary | ICD-10-CM | POA: Diagnosis not present

## 2020-10-20 DIAGNOSIS — G4733 Obstructive sleep apnea (adult) (pediatric): Secondary | ICD-10-CM | POA: Diagnosis not present

## 2020-10-20 DIAGNOSIS — G8929 Other chronic pain: Secondary | ICD-10-CM

## 2020-10-20 DIAGNOSIS — K5903 Drug induced constipation: Secondary | ICD-10-CM

## 2020-10-20 DIAGNOSIS — I10 Essential (primary) hypertension: Secondary | ICD-10-CM | POA: Diagnosis not present

## 2020-10-20 DIAGNOSIS — F17219 Nicotine dependence, cigarettes, with unspecified nicotine-induced disorders: Secondary | ICD-10-CM

## 2020-10-20 DIAGNOSIS — G894 Chronic pain syndrome: Secondary | ICD-10-CM | POA: Diagnosis not present

## 2020-10-20 DIAGNOSIS — N400 Enlarged prostate without lower urinary tract symptoms: Secondary | ICD-10-CM

## 2020-10-20 DIAGNOSIS — F1120 Opioid dependence, uncomplicated: Secondary | ICD-10-CM

## 2020-10-20 DIAGNOSIS — K219 Gastro-esophageal reflux disease without esophagitis: Secondary | ICD-10-CM

## 2020-10-20 MED ORDER — SPIRIVA RESPIMAT 2.5 MCG/ACT IN AERS: 2.0000 | INHALATION_SPRAY | Freq: Every day | RESPIRATORY_TRACT | 2 refills | Status: DC

## 2020-10-20 NOTE — Progress Notes (Signed)
Subjective:  Patient ID: Jacob Graves, male    DOB: 08-25-1955  Age: 65 y.o. MRN: [id number]  Chief Complaint  Patient presents with   Hypertension   Gastroesophageal Reflux    HPI: Hypertension: on propranolol. Also takes for migraines.  Hyperlipidemia: On lipitor 80 mg once daily at night.  Chronic back pain: ibuprofen 800 mg one three times a day prn. At least once daily. On gabapentin 600 mg one three ties a day. Suboxone films once daily.  Opioid induced constipation: On linzess 145 mcg once daily in am. Takes miralax  COPD: ON Spiriva 2 puffs daily and ventolin HFA.  GERD: on omeprazole 40 mg once daily. Has nausea and throws up after eating some times.  Tobacco discorder: has not taking chantix for 2 weeks. 10-15 cigarettes per day. Chantix has helped him decrease number of cigarettes.  BPH: on tamsulosin.  OSA: dxd 3 years ago. Not using cpap. He has had increased somnolence.   Current Outpatient Medications on File Prior to Visit  Medication Sig Dispense Refill   albuterol (VENTOLIN HFA) 108 (90 Base) MCG/ACT inhaler Inhale 2 puffs into the lungs every 6 (six) hours as needed for wheezing or shortness of breath. 8 g 3   Aspirin-Acetaminophen-Caffeine 500-325-65 MG PACK Take 325 mg by mouth 2 (two) times daily as needed.     atorvastatin (LIPITOR) 80 MG tablet TAKE 1 TABLET BY MOUTH  DAILY 90 tablet 3   Buprenorphine HCl-Naloxone HCl 8-2 MG FILM Place under the tongue.     fluticasone (FLONASE) 50 MCG/ACT nasal spray INHALE 1 SPRAY IN EACH NOSTRIL EVERY DAY 16 g 2   gabapentin (NEURONTIN) 600 MG tablet TAKE ONE TABLET BY MOUTH EVERY MORNING AT NOON  IN THE EVENING  AND AT BEDTIME 120 tablet 3   ibuprofen (ADVIL) 800 MG tablet TAKE 1 TABLET(800 MG) BY MOUTH THREE TIMES DAILY WITH FOOD 90 tablet 1   LINZESS 145 MCG CAPS capsule TAKE ONE CAPSULE BY MOUTH EVERY DAY BEFORE BREAKFAST 30 capsule 3   montelukast (SINGULAIR) 10 MG tablet Take 1 tablet by mouth daily 90 tablet 3    omeprazole (PRILOSEC) 40 MG capsule TAKE 1 CAPSULE BY MOUTH  DAILY BEFORE A MEAL 90 capsule 3   ondansetron (ZOFRAN) 4 MG tablet TAKE ONE TABLET BY MOUTH EVERY 8 HOURS AS NEEDED FOR NAUSEA/VOMITING 60 tablet 0   polyethylene glycol powder (GLYCOLAX/MIRALAX) 17 GM/SCOOP powder Take 17 g by mouth daily.     propranolol (INDERAL) 60 MG tablet TAKE 1 TABLET BY MOUTH  TWICE DAILY 180 tablet 3   tamsulosin (FLOMAX) 0.4 MG CAPS capsule TAKE 2 CAPSULES(0.8 MG) BY MOUTH DAILY AFTER SUPPER 180 capsule 1   varenicline (CHANTIX) 1 MG tablet Take 1 tablet (1 mg total) by mouth 2 (two) times daily. 60 tablet 3   No current facility-administered medications on file prior to visit.   Past Medical History:  Diagnosis Date   Apnea    Arthritis    Arthritis    spinal arthritis   Cancer ([place])    melanoma, basal cell   Centrilobular emphysema ([place]) 07/08/2015   Depression    Emphysema of lung (HCC)    COPD   GERD (gastroesophageal reflux disease)    Hypercholesterolemia    Hypertension    Impaired fasting glucose    Kyphosis    mild   Migraine    Neuropathy    Osteoarthritis    Other testicular dysfunction    Scoliosis  Sleep apnea    Solitary pulmonary nodule    Tinnitus    Past Surgical History:  Procedure Laterality Date   COLONOSCOPY  2016   Colon polyps. Tubular Adenoma.   TONSILLECTOMY      Family History  Problem Relation Age of Onset   Heart disease Father    Stroke Father    Breast cancer Maternal Grandmother    Leukemia Mother    Skin cancer Mother    Diabetes Mother    Stroke Mother    Hypertension Mother    Skin cancer Brother    Cancer Brother        Melanoma   Hypertension Brother    Colon cancer Neg Hx    Social History   Socioeconomic History   Marital status: Married    Spouse name: Baker Janus   Number of children: 2   Years of education: 12   Highest education level: Not on file  Occupational History   Occupation: disabled  Tobacco Use   Smoking status:  Every Day    Packs/day: 0.50    Types: Cigarettes    Start date: 1974   Smokeless tobacco: Never   Tobacco comments:    currently smoking 3/4 ppd. For years smoked 1 1/2 ppd   Vaping Use   Vaping Use: Never used  Substance and Sexual Activity   Alcohol use: No    Alcohol/week: 0.0 standard drinks   Drug use: No   Sexual activity: Not on file  Other Topics Concern   Not on file  Social History Narrative   Lives with wife   Caffeine- coffee, 3 cups daily   Social Determinants of Health   Financial Resource Strain: Not on file  Food Insecurity: Not on file  Transportation Needs: Not on file  Physical Activity: Not on file  Stress: Not on file  Social Connections: Not on file    Review of Systems  Constitutional:  Negative for chills, fatigue and fever.  HENT:  Negative for congestion, ear pain and sore throat.   Respiratory:  Negative for cough and shortness of breath.   Cardiovascular:  Negative for chest pain.  Gastrointestinal:  Positive for vomiting. Negative for abdominal pain, constipation, diarrhea and nausea.  Endocrine: Negative for polydipsia, polyphagia and polyuria.  Genitourinary:  Negative for dysuria and frequency.  Musculoskeletal:  Positive for back pain. Negative for arthralgias and myalgias.  Neurological:  Positive for headaches. Negative for dizziness.  Psychiatric/Behavioral:  Negative for dysphoric mood.        No dysphoria    Objective:  BP 138/70   Pulse 72   Temp (!) 96.1 F (35.6 C)   Resp 16   Ht '5\' 9"'$  (1.753 m)   Wt 170 lb 3.2 oz (77.2 kg)   BMI 25.13 kg/m   BP/Weight 10/20/2020 06/30/2020 [id number]  Systolic BP 0000000 [id number]  Diastolic BP 70 78 76  Wt. (Lbs) 170.2 178 179  BMI 25.13 27.06 27.22    Physical Exam Vitals reviewed.  Constitutional:      Appearance: Normal appearance.  HENT:     Right Ear: Tympanic membrane, ear canal and external ear normal.     Left Ear: Tympanic membrane, ear canal and external ear normal.      Nose: Nose normal. No congestion or rhinorrhea.     Mouth/Throat:     Mouth: Mucous membranes are moist.     Pharynx: No oropharyngeal exudate or posterior oropharyngeal erythema.  Neck:  Vascular: No carotid bruit.  Cardiovascular:     Rate and Rhythm: Normal rate and regular rhythm.     Pulses: Normal pulses.     Heart sounds: Normal heart sounds.  Pulmonary:     Effort: Pulmonary effort is normal. No respiratory distress.     Breath sounds: Normal breath sounds. No wheezing, rhonchi or rales.  Abdominal:     General: Bowel sounds are normal.     Palpations: Abdomen is soft.     Tenderness: There is no abdominal tenderness.  Musculoskeletal:        General: Tenderness and deformity (kyphoscoliosis) present.     Thoracic back: Scoliosis (and kyphosis) present.     Lumbar back: Scoliosis (and kyphosis) present.  Lymphadenopathy:     Cervical: No cervical adenopathy.  Neurological:     Mental Status: He is alert.  Psychiatric:        Mood and Affect: Mood normal.        Behavior: Behavior normal.    Diabetic Foot Exam - Simple   No data filed      Lab Results  Component Value Date   WBC 7.3 12/19/2019   HGB 13.8 12/19/2019   HCT 41.4 12/19/2019   PLT 198 12/19/2019   GLUCOSE 93 12/19/2019   CHOL 121 12/19/2019   TRIG 67 12/19/2019   HDL 39 (L) 12/19/2019   LDLCALC 68 12/19/2019   ALT 13 12/19/2019   AST 9 12/19/2019   NA 141 12/19/2019   K 5.0 12/19/2019   CL 104 12/19/2019   CREATININE 0.72 (L) 12/19/2019   BUN 11 12/19/2019   CO2 28 12/19/2019   TSH 1.090 12/19/2019      Assessment & Plan:   1. Essential hypertension, benign Well controlled.  No changes to medicines.  Continue to work on eating a healthy diet and exercise.  Labs drawn today.  - CBC with Differential/Platelet - Comprehensive metabolic panel - Lipid panel - TSH  2. Gastroesophageal reflux disease without esophagitis The current medical regimen is effective;  continue present  plan and medications.  3. Chronic pain syndrome Management per specialist. On soboxone  4. COPD, moderate ([place]) The current medical regimen is effective;  continue present plan and medications. - Tiotropium Bromide Monohydrate (SPIRIVA RESPIMAT) 2.5 MCG/ACT AERS; Inhale 2 puffs into the lungs daily.  Dispense: 4 g; Refill: 2 - CT CHEST LUNG CANCER SCREENING LOW DOSE WO CONTRAST  5. Chronic midline low back pain without sciatica Management per specialist.   6. Encounter for screening for lung cancer - CT CHEST LUNG CANCER SCREENING LOW DOSE WO CONTRAST  7. OSA and COPD overlap syndrome (HCC) Recommended use of cpap  8. Cigarette nicotine dependence with nicotine-induced disorder Continue chantix and take for 5-6 months 10. Uncomplicated opioid dependence ([place])  On suboxone  11. BPH Continue [id number]  Meds ordered this encounter  Medications   Tiotropium Bromide Monohydrate (SPIRIVA RESPIMAT) 2.5 MCG/ACT AERS    Sig: Inhale 2 puffs into the lungs daily.    Dispense:  4 g    Refill:  2    Pt needs appt for further refills    Orders Placed This Encounter  Procedures   CT CHEST LUNG CANCER SCREENING LOW DOSE WO CONTRAST   CBC with Differential/Platelet   Comprehensive metabolic panel   Lipid panel   TSH     Follow-up: Return in about 3 months (around 01/20/2021) for fasting.  An After Visit Summary was printed and given to the patient.  Rochel Brome, MD [facility] [phone]

## 2020-10-20 NOTE — Patient Instructions (Addendum)
OSA: recommend wear cpap.   Smoking Tobacco Information, Adult Smoking tobacco can be harmful to your health. Tobacco contains a poisonous (toxic), colorless chemical called nicotine. Nicotine is addictive. It changes the brain and can make it hard to stop smoking. Tobacco also has other toxicchemicals that can hurt your body and raise your risk of many cancers. How can smoking tobacco affect me? Smoking tobacco puts you at risk for: Cancer. Smoking is most commonly associated with lung cancer, but can also lead to cancer in other parts of the body. Chronic obstructive pulmonary disease (COPD). This is a long-term lung condition that makes it hard to breathe. It also gets worse over time. High blood pressure (hypertension), heart disease, stroke, or heart attack. Lung infections, such as pneumonia. Cataracts. This is when the lenses in the eyes become clouded. Digestive problems. This may include peptic ulcers, heartburn, and gastroesophageal reflux disease (GERD). Oral health problems, such as gum disease and tooth loss. Loss of taste and smell. Smoking can affect your appearance by causing: Wrinkles. Yellow or stained teeth, fingers, and fingernails. Smoking tobacco can also affect your social life, because: It may be challenging to find places to smoke when away from home. Many workplaces, [employer], hotels, and public places are tobacco-free. Smoking is expensive. This is due to the cost of tobacco and the long-term costs of treating health problems from smoking. Secondhand smoke may affect those around you. Secondhand smoke can cause lung cancer, breathing problems, and heart disease. Children of smokers have a higher risk for: Sudden infant death syndrome (SIDS). Ear infections. Lung infections. If you currently smoke tobacco, quitting now can help you: Lead a longer and healthier life. Look, smell, breathe, and feel better over time. Save money. Protect others from the harms of  secondhand smoke. What actions can I take to prevent health problems? Quit smoking  Do not start smoking. Quit if you already do. Make a plan to quit smoking and commit to it. Look for programs to help you and ask your health care provider for recommendations and ideas. Set a date and write down all the reasons you want to quit. Let your friends and family know you are quitting so they can help and support you. Consider finding friends who also want to quit. It can be easier to quit with someone else, so that you can support each other. Talk with your health care provider about using nicotine replacement medicines to help you quit, such as gum, lozenges, patches, sprays, or pills. Do not replace cigarette smoking with electronic cigarettes, which are commonly called e-cigarettes. The safety of e-cigarettes is not known, and some may contain harmful chemicals. If you try to quit but return to smoking, stay positive. It is common to slip up when you first quit, so take it one day at a time. Be prepared for cravings. When you feel the urge to smoke, chew gum or suck on hard candy.  Lifestyle Stay busy and take care of your body. Drink enough fluid to keep your urine pale yellow. Get plenty of exercise and eat a healthy diet. This can help prevent weight gain after quitting. Monitor your eating habits. Quitting smoking can cause you to have a larger appetite than when you smoke. Find ways to relax. Go out with friends or family to a movie or a restaurant where people do not smoke. Ask your health care provider about having regular tests (screenings) to check for cancer. This may include blood tests, imaging tests, and  other tests. Find ways to manage your stress, such as meditation, yoga, or exercise. Where to find support To get support to quit smoking, consider: Asking your health care provider for more information and resources. Taking classes to learn more about quitting smoking. Looking for  local organizations that offer resources about quitting smoking. Joining a support group for people who want to quit smoking in your local community. Calling the smokefree.gov counselor helpline: 1-800-Quit-Now 5596007687) Where to find more information You may find more information about quitting smoking from: HelpGuide.org: www.helpguide.org https://hall.com/: smokefree.gov American Lung Association: www.lung.org Contact a health care provider if you: Have problems breathing. Notice that your lips, nose, or fingers turn blue. Have chest pain. Are coughing up blood. Feel faint or you pass out. Have other health changes that cause you to worry. Summary Smoking tobacco can negatively affect your health, the health of those around you, your finances, and your social life. Do not start smoking. Quit if you already do. If you need help quitting, ask your health care provider. Think about joining a support group for people who want to quit smoking in your local community. There are many effective programs that will help you to quit this behavior. This information is not intended to replace advice given to you by your health care provider. Make sure you discuss any questions you have with your healthcare provider. Document Revised: 01/08/2020 Document Reviewed: 01/08/2020 Elsevier Patient Education  2022 [employer].

## 2020-10-21 ENCOUNTER — Telehealth: Payer: Self-pay | Admitting: Family Medicine

## 2020-10-21 LAB — CBC WITH DIFFERENTIAL/PLATELET
Basophils Absolute: 0.1 10*3/uL (ref 0.0–0.2)
Basos: 1 %
EOS (ABSOLUTE): 0.4 10*3/uL (ref 0.0–0.4)
Eos: 5 %
Hematocrit: 39.6 % (ref 37.5–51.0)
Hemoglobin: 13.8 g/dL (ref 13.0–17.7)
Immature Grans (Abs): 0 10*3/uL (ref 0.0–0.1)
Immature Granulocytes: 0 %
Lymphocytes Absolute: 2.3 10*3/uL (ref 0.7–3.1)
Lymphs: 29 %
MCH: 30.9 pg (ref 26.6–33.0)
MCHC: 34.8 g/dL (ref 31.5–35.7)
MCV: 89 fL (ref 79–97)
Monocytes Absolute: 0.6 10*3/uL (ref 0.1–0.9)
Monocytes: 8 %
Neutrophils Absolute: 4.4 10*3/uL (ref 1.4–7.0)
Neutrophils: 57 %
Platelets: 267 10*3/uL (ref 150–450)
RBC: 4.47 x10E6/uL (ref 4.14–5.80)
RDW: 12.2 % (ref 11.6–15.4)
WBC: 7.7 10*3/uL (ref 3.4–10.8)

## 2020-10-21 LAB — CARDIOVASCULAR RISK ASSESSMENT

## 2020-10-21 LAB — COMPREHENSIVE METABOLIC PANEL
ALT: 10 IU/L (ref 0–44)
AST: 8 IU/L (ref 0–40)
Albumin/Globulin Ratio: 2.1 (ref 1.2–2.2)
Albumin: 4.2 g/dL (ref 3.8–4.8)
Alkaline Phosphatase: 113 IU/L (ref 44–121)
BUN/Creatinine Ratio: 18 (ref 10–24)
BUN: 14 mg/dL (ref 8–27)
Bilirubin Total: 0.3 mg/dL (ref 0.0–1.2)
CO2: 29 mmol/L (ref 20–29)
Calcium: 8.8 mg/dL (ref 8.6–10.2)
Chloride: 99 mmol/L (ref 96–106)
Creatinine, Ser: 0.76 mg/dL (ref 0.76–1.27)
Globulin, Total: 2 g/dL (ref 1.5–4.5)
Glucose: 92 mg/dL (ref 65–99)
Potassium: 4.3 mmol/L (ref 3.5–5.2)
Sodium: 142 mmol/L (ref 134–144)
Total Protein: 6.2 g/dL (ref 6.0–8.5)
eGFR: 102 mL/min/{1.73_m2} (ref >59)

## 2020-10-21 LAB — LIPID PANEL
Chol/HDL Ratio: 2.9 ratio (ref 0.0–5.0)
Cholesterol, Total: 126 mg/dL (ref 100–199)
HDL: 43 mg/dL (ref >39)
LDL Chol Calc (NIH): 65 mg/dL (ref 0–99)
Triglycerides: 95 mg/dL (ref 0–149)
VLDL Cholesterol Cal: 18 mg/dL (ref 5–40)

## 2020-10-21 LAB — TSH: TSH: 1.78 u[IU]/mL (ref 0.450–4.500)

## 2020-10-21 NOTE — Telephone Encounter (Signed)
   Jacob Graves has been scheduled for the following appointment:  WHAT: CT LUNG SCREEN WHERE: [facility] DATE: 11/05/20 TIME: 8:30 AM ARRIVAL  Patient has been made aware.

## 2020-10-24 ENCOUNTER — Other Ambulatory Visit: Payer: Self-pay | Admitting: Family Medicine

## 2020-10-29 NOTE — Patient Instructions (Signed)
Visit Information   Goals Addressed             This Visit's Progress    Lifestyle Change-Hypertension       Timeframe:  Long-Range Goal Priority:  High Start Date:                             Expected End Date:                       Follow Up Date 10/2020    - ask questions to understand    Why is this important?   The changes that you are asked to make may be hard to do.  This is especially true when the changes are life-long.  Knowing why it is important to you is the first step.  Working on the change with your family or support person helps you not feel alone.  Reward yourself and family or support person when goals are met. This can be an activity you choose like bowling, hiking, biking, swimming or shooting hoops.     Notes:      Track and Manage My Symptoms-COPD       Timeframe:  Long-Range Goal Priority:  High Start Date:                             Expected End Date:                       Follow Up Date 10/2020    - begin a symptom diary - develop a rescue plan - eliminate symptom triggers at home - keep follow-up appointments    Why is this important?   Tracking your symptoms and other information about your health helps your doctor plan your care.  Write down the symptoms, the time of day, what you were doing and what medicine you are taking.  You will soon learn how to manage your symptoms.     Notes:        Patient Care Plan: CCM Pharmacy Care Plan     Problem Identified: htn and smoking cessation   Priority: High  Onset Date: 10/29/2020     Long-Range Goal: Disease State Management   Start Date: 10/29/2020  Expected End Date: 10/29/2021  This Visit's Progress: On track  Priority: High  Note:    Current Barriers:  Unable to maintain control of smoking cessation  Pharmacist Clinical Goal(s):  Patient will maintain control of  tobacco use as evidenced by smoking cessation  through collaboration with PharmD and provider.    Interventions: 1:1 collaboration with Cox, Elnita Maxwell, MD regarding development and update of comprehensive plan of care as evidenced by provider attestation and co-signature Inter-disciplinary care team collaboration (see longitudinal plan of care) Comprehensive medication review performed; medication list updated in electronic medical record  (BP goal <140/90) -Controlled -Current treatment: Propanolol 60 mg bid -Medications previously tried: none reported  -Current home readings: well controlled -Current dietary habits: tries to eat healthy diet -Current exercise habits: enjoys fishing but exercise limited by back pain -Denies hypotensive/hypertensive symptoms -Educated on BP goals and benefits of medications for prevention of heart attack, stroke and kidney damage; Daily salt intake goal < 2300 mg; Exercise goal of 150 minutes per week; -Counseled to monitor BP at home as needed, document, and provide log at future appointments -Counseled on diet  and exercise extensively Counseled on benefits of smoking cessation on blood pressure control.   Tobacco use (Goal smoking cessation) -Not ideally controlled -Previous quit attempts: yes -Current treatment  Chatnix 1 mg bid -Patient triggers include: finishing a meal and getting together with brothers -On a scale of 1-10, reports MOTIVATION to quit is 10 -On a scale of 1-10, reports CONFIDENCE in quitting is 10 -Patient set quit date of 10/22/2020. Provided contact information for [place] [facility]uit [facility] (1-800-QUIT-NOW) and encouraged patient to reach out to this group for support. -Counseled on replacing habit of smoking with another activity to keep mind and hands occupied.   Patient Goals/Self-Care Activities Patient will:  - take medications as prescribed  Follow Up Plan: Telephone follow up appointment with care management team member scheduled for: 10/2020       Patient verbalizes understanding of instructions provided today and  agrees to view in [place].  Telephone follow up appointment with pharmacy team member scheduled for: 10/2020 to support smoking cessation efforts  Burnice Logan, [facility]

## 2020-10-31 ENCOUNTER — Other Ambulatory Visit: Payer: Self-pay | Admitting: Family Medicine

## 2020-10-31 NOTE — Telephone Encounter (Signed)
Refill sent to pharmacy.   

## 2020-11-11 DIAGNOSIS — F17210 Nicotine dependence, cigarettes, uncomplicated: Secondary | ICD-10-CM | POA: Diagnosis not present

## 2020-11-11 DIAGNOSIS — Z87891 Personal history of nicotine dependence: Secondary | ICD-10-CM | POA: Diagnosis not present

## 2020-11-12 ENCOUNTER — Other Ambulatory Visit: Payer: Self-pay

## 2020-11-12 ENCOUNTER — Other Ambulatory Visit: Payer: Self-pay | Admitting: Family Medicine

## 2020-11-12 DIAGNOSIS — R911 Solitary pulmonary nodule: Secondary | ICD-10-CM

## 2020-11-17 ENCOUNTER — Other Ambulatory Visit: Payer: Self-pay

## 2020-11-17 ENCOUNTER — Other Ambulatory Visit: Payer: Self-pay | Admitting: Family Medicine

## 2020-11-17 DIAGNOSIS — R911 Solitary pulmonary nodule: Secondary | ICD-10-CM

## 2020-11-18 NOTE — Telephone Encounter (Signed)
Refill sent to pharmacy.   

## 2020-11-24 ENCOUNTER — Other Ambulatory Visit: Payer: Self-pay | Admitting: Family Medicine

## 2020-11-28 ENCOUNTER — Other Ambulatory Visit: Payer: Self-pay

## 2020-11-28 ENCOUNTER — Ambulatory Visit (HOSPITAL_COMMUNITY)
Admission: RE | Admit: 2020-11-28 | Discharge: 2020-11-28 | Disposition: A | Discharge status: Home / Self Care | Payer: Medicare Other | Source: Ambulatory Visit | Attending: Family Medicine | Admitting: Family Medicine

## 2020-11-28 DIAGNOSIS — Q6101 Congenital single renal cyst: Secondary | ICD-10-CM | POA: Insufficient documentation

## 2020-11-28 DIAGNOSIS — I700 Atherosclerosis of aorta: Secondary | ICD-10-CM | POA: Insufficient documentation

## 2020-11-28 DIAGNOSIS — J439 Emphysema, unspecified: Secondary | ICD-10-CM | POA: Insufficient documentation

## 2020-11-28 DIAGNOSIS — R911 Solitary pulmonary nodule: Secondary | ICD-10-CM | POA: Diagnosis not present

## 2020-11-28 LAB — GLUCOSE, CAPILLARY: Glucose-Capillary: 95 mg/dL (ref 70–99)

## 2020-11-28 MED ORDER — FLUDEOXYGLUCOSE F - 18 (FDG) INJECTION
8.5000 | Freq: Once | INTRAVENOUS | Status: AC
  Administered 2020-11-28: 8.7 via INTRAVENOUS

## 2020-12-01 ENCOUNTER — Other Ambulatory Visit: Payer: Self-pay

## 2020-12-01 ENCOUNTER — Telehealth: Payer: Self-pay | Admitting: Family Medicine

## 2020-12-01 DIAGNOSIS — N289 Disorder of kidney and ureter, unspecified: Secondary | ICD-10-CM

## 2020-12-01 NOTE — Telephone Encounter (Signed)
   Jacob Graves has been scheduled for the following appointment:  WHAT: RENAL ULTRASOUND WHERE: [facility] DATE: 12/04/20 TIME: 10:30 AM ARRIVAL TIME  Patient has been made aware. SPOKE TO PT'S WIFE

## 2020-12-04 DIAGNOSIS — N2889 Other specified disorders of kidney and ureter: Secondary | ICD-10-CM | POA: Diagnosis not present

## 2020-12-04 DIAGNOSIS — N281 Cyst of kidney, acquired: Secondary | ICD-10-CM | POA: Diagnosis not present

## 2020-12-05 DIAGNOSIS — Z79899 Other long term (current) drug therapy: Secondary | ICD-10-CM | POA: Diagnosis not present

## 2020-12-25 ENCOUNTER — Other Ambulatory Visit: Payer: Self-pay | Admitting: Family Medicine

## 2020-12-29 ENCOUNTER — Other Ambulatory Visit: Payer: Self-pay | Admitting: Family Medicine

## 2021-01-12 ENCOUNTER — Ambulatory Visit (INDEPENDENT_AMBULATORY_CARE_PROVIDER_SITE_OTHER): Payer: Medicare Other | Admitting: Family Medicine

## 2021-01-12 ENCOUNTER — Encounter: Payer: Self-pay | Admitting: Family Medicine

## 2021-01-12 ENCOUNTER — Other Ambulatory Visit: Payer: Self-pay

## 2021-01-12 VITALS — BP 124/82 | HR 71 | Temp 96.8°F | Ht 69.0 in | Wt 170.0 lb

## 2021-01-12 DIAGNOSIS — R112 Nausea with vomiting, unspecified: Secondary | ICD-10-CM | POA: Diagnosis not present

## 2021-01-12 DIAGNOSIS — R42 Dizziness and giddiness: Secondary | ICD-10-CM | POA: Diagnosis not present

## 2021-01-12 DIAGNOSIS — K5903 Drug induced constipation: Secondary | ICD-10-CM

## 2021-01-12 DIAGNOSIS — G894 Chronic pain syndrome: Secondary | ICD-10-CM

## 2021-01-12 DIAGNOSIS — T402X5A Adverse effect of other opioids, initial encounter: Secondary | ICD-10-CM

## 2021-01-12 MED ORDER — MECLIZINE HCL 25 MG PO TABS: 25.0000 mg | ORAL_TABLET | Freq: Three times a day (TID) | ORAL | 0 refills | Status: DC | PRN

## 2021-01-12 MED ORDER — LUBIPROSTONE 24 MCG PO CAPS: 24.0000 ug | ORAL_CAPSULE | Freq: Two times a day (BID) | ORAL | 1 refills | Status: DC

## 2021-01-12 NOTE — Progress Notes (Signed)
Acute Office Visit  Subjective:    Patient ID: Jacob Graves, male    DOB: Feb 05, 1956, 65 y.o.   MRN: [id number]  Chief Complaint  Patient presents with   Gastroesophageal Reflux    HPI: Patient is in today for GERD, Nausea and vomiting. Burns a lot on his throat. Taking omeprazole 40 mg once daily. Taking sodium bicarbonate in tablet otc which seems to help.  GERD and nausea for months, but the vomiting just started. 1-2 emesis per week. Pt gets car sick easily. No abdominal pain. No diarrhea. Pt has a lot of constipation. Used MOM yesterday, Linzess 145 mg once daily and took miralax.   Patient states he was taking suboxone and THC gummy bears but while taking both he was nauseated and vomited. He held suboxone x 3 days. He thought the combination of THC gummies and suboxone was causing N/V.  Reports feeling nauseated in the mornings prior to eating at times. Denies any abdominal pain.  Past Medical History:  Diagnosis Date   Apnea    Arthritis    Arthritis    spinal arthritis   Cancer ([place])    melanoma, basal cell   Centrilobular emphysema ([place]) 07/08/2015   Depression    Emphysema of lung ([facility])    COPD   GERD (gastroesophageal reflux disease)    Hypercholesterolemia    Hypertension    Impaired fasting glucose    Kyphosis    mild   Migraine    Neuropathy    Osteoarthritis    Other testicular dysfunction    Scoliosis    Sleep apnea    Solitary pulmonary nodule    Tinnitus     Past Surgical History:  Procedure Laterality Date   COLONOSCOPY  2016   Colon polyps. Tubular Adenoma.   TONSILLECTOMY      Family History  Problem Relation Age of Onset   Heart disease Father    Stroke Father    Breast cancer Maternal Grandmother    Leukemia Mother    Skin cancer Mother    Diabetes Mother    Stroke Mother    Hypertension Mother    Skin cancer Brother    Cancer Brother        Melanoma   Hypertension Brother    Colon cancer Neg Hx     Social History    Socioeconomic History   Marital status: Married    Spouse name: Baker Janus   Number of children: 2   Years of education: 12   Highest education level: Not on file  Occupational History   Occupation: disabled  Tobacco Use   Smoking status: Every Day    Packs/day: 0.50    Types: Cigarettes    Start date: 1974   Smokeless tobacco: Never   Tobacco comments:    currently smoking 3/4 ppd. For years smoked 1 1/2 ppd   Vaping Use   Vaping Use: Never used  Substance and Sexual Activity   Alcohol use: No    Alcohol/week: 0.0 standard drinks   Drug use: No   Sexual activity: Not on file  Other Topics Concern   Not on file  Social History Narrative   Lives with wife   Caffeine- coffee, 3 cups daily   Social Determinants of Health   Financial Resource Strain: Not on file  Food Insecurity: Not on file  Transportation Needs: Not on file  Physical Activity: Not on file  Stress: Not on file  Social Connections: Not on file  Intimate Partner Violence: Not on file    Outpatient Medications Prior to Visit  Medication Sig Dispense Refill   Aspirin-Acetaminophen-Caffeine 500-325-65 MG PACK Take 325 mg by mouth 2 (two) times daily as needed.     atorvastatin (LIPITOR) 80 MG tablet TAKE 1 TABLET BY MOUTH  DAILY 90 tablet 3   Buprenorphine HCl-Naloxone HCl 8-2 MG FILM Place under the tongue.     fluticasone (FLONASE) 50 MCG/ACT nasal spray INHALE 1 SPRAY IN EACH NOSTRIL EVERY DAY 16 g 2   gabapentin (NEURONTIN) 600 MG tablet TAKE ONE TABLET BY MOUTH EVERY MORNING AT NOON  IN THE EVENING  AND AT BEDTIME 120 tablet 3   ibuprofen (ADVIL) 800 MG tablet TAKE 1 TABLET(800 MG) BY MOUTH THREE TIMES DAILY WITH FOOD 90 tablet 1   montelukast (SINGULAIR) 10 MG tablet TAKE 1 TABLET(10 MG) BY MOUTH AT BEDTIME 90 tablet 3   omeprazole (PRILOSEC) 40 MG capsule TAKE 1 CAPSULE BY MOUTH  DAILY BEFORE A MEAL 90 capsule 3   polyethylene glycol powder (GLYCOLAX/MIRALAX) 17 GM/SCOOP powder Take 17 g by mouth daily.      propranolol (INDERAL) 60 MG tablet TAKE 1 TABLET BY MOUTH  TWICE DAILY 180 tablet 3   tamsulosin (FLOMAX) 0.4 MG CAPS capsule TAKE 2 CAPSULES BY MOUTH  DAILY AFTER SUPPER 180 capsule 3   varenicline (CHANTIX) 1 MG tablet Take 1 tablet (1 mg total) by mouth 2 (two) times daily. 60 tablet 3   albuterol (VENTOLIN HFA) 108 (90 Base) MCG/ACT inhaler Inhale 2 puffs into the lungs every 6 (six) hours as needed for wheezing or shortness of breath. 8 g 3   LINZESS 145 MCG CAPS capsule TAKE ONE CAPSULE BY MOUTH EVERY DAY BEFORE BREAKFAST 30 capsule 3   ondansetron (ZOFRAN) 4 MG tablet TAKE ONE TABLET BY MOUTH EVERY 8 HOURS AS NEEDED FOR NAUSEA/VOMITING 60 tablet 0   Tiotropium Bromide Monohydrate (SPIRIVA RESPIMAT) 2.5 MCG/ACT AERS Inhale 2 puffs into the lungs daily. 4 g 2   No facility-administered medications prior to visit.    Allergies  Allergen Reactions   Codeine Nausea Only and Nausea And Vomiting   Duloxetine     Loss of appetite   Fentanyl     Fatigue    Fluoxetine     Agitation   Ipratropium-Albuterol     Myalgia   Penicillins Itching and Other (See Comments)    As child    Zoloft [Sertraline]     Sweating     Review of Systems  Constitutional:  Positive for fatigue. Negative for chills, diaphoresis and fever.  HENT:  Positive for postnasal drip. Negative for congestion, ear pain and sore throat.   Respiratory:  Positive for cough, shortness of breath and wheezing.   Cardiovascular:  Negative for chest pain and leg swelling.  Gastrointestinal:  Positive for constipation, nausea and vomiting. Negative for abdominal pain and diarrhea.      Objective:    Physical Exam Vitals reviewed.  Constitutional:      Appearance: Normal appearance.  HENT:     Right Ear: Tympanic membrane normal.     Left Ear: Tympanic membrane normal.     Nose: Nose normal.     Mouth/Throat:     Pharynx: No oropharyngeal exudate or posterior oropharyngeal erythema.  Cardiovascular:     Rate  and Rhythm: Normal rate and regular rhythm.     Heart sounds: Normal heart sounds.  Pulmonary:     Effort: Pulmonary effort is normal.  Breath sounds: Normal breath sounds. No wheezing, rhonchi or rales.  Abdominal:     General: Bowel sounds are normal.     Palpations: Abdomen is soft.     Tenderness: There is no abdominal tenderness.  Neurological:     Mental Status: He is alert.     Cranial Nerves: No cranial nerve deficit.  Psychiatric:        Mood and Affect: Mood normal.        Behavior: Behavior normal.    BP 124/82   Pulse 71   Temp (!) 96.8 F (36 C)   Ht _0  (1.753 m)   Wt 170 lb (77.1 kg)   SpO2 98%   BMI 25.10 kg/m  Wt Readings from Last 3 Encounters:  01/27/21 169 lb (76.7 kg)  01/12/21 170 lb (77.1 kg)  10/20/20 170 lb 3.2 oz (77.2 kg)    Health Maintenance Due  Topic Date Due   HIV Screening  Never done   Hepatitis C Screening  Never done   Zoster Vaccines- Shingrix (1 of 2) Never done    There are no preventive care reminders to display for this patient.   Lab Results  Component Value Date   TSH 1.780 10/20/2020   Lab Results  Component Value Date   WBC 7.1 01/27/2021   HGB 13.6 01/27/2021   HCT 40.0 01/27/2021   MCV 89 01/27/2021   PLT 209 01/27/2021   Lab Results  Component Value Date   NA 142 01/27/2021   K 4.4 01/27/2021   CO2 26 01/27/2021   GLUCOSE 104 (H) 01/27/2021   BUN 13 01/27/2021   CREATININE 0.84 01/27/2021   BILITOT 0.3 01/27/2021   ALKPHOS 132 (H) 01/27/2021   AST 7 01/27/2021   ALT 13 01/27/2021   PROT 6.4 01/27/2021   ALBUMIN 4.4 01/27/2021   CALCIUM 8.5 (L) 01/27/2021   EGFR 98 01/27/2021   GFR 95.22 07/15/2017   Lab Results  Component Value Date   CHOL 130 01/27/2021   Lab Results  Component Value Date   HDL 45 01/27/2021   Lab Results  Component Value Date   LDLCALC 70 01/27/2021   Lab Results  Component Value Date   TRIG 77 01/27/2021   Lab Results  Component Value Date   CHOLHDL 2.9  01/27/2021   No results found for: HGBA1C     Assessment & Plan:   Problem List Items Addressed This Visit       Digestive   Nausea and vomiting   Therapeutic opioid induced constipation - Primary    Stop Linzess.  Start on Amitiza 24 mcg twice daily.        Other   Chronic pain syndrome    Chest pain clinic.  Currently he is stopped his Suboxone and is only taking the THC Gummies.      Vertigo    Prescription for meclizine given.      Meds ordered this encounter  Medications   DISCONTD: meclizine (ANTIVERT) 25 MG tablet    Sig: Take 1 tablet (25 mg total) by mouth 3 (three) times daily as needed for dizziness.    Dispense:  30 tablet    Refill:  0   lubiprostone (AMITIZA) 24 MCG capsule    Sig: Take 1 capsule (24 mcg total) by mouth 2 (two) times daily with a meal.    Dispense:  180 capsule    Refill:  1    Failed on linzess, miralax, and MOM.  No orders of the defined types were placed in this encounter.    Follow-up: Return in about 2 weeks (around 01/26/2021) for chronic fasting.  An After Visit Summary was printed and given to the patient.  Rochel Brome, MD [facility] [phone]

## 2021-01-12 NOTE — Patient Instructions (Signed)
Constipation:  Stop linzess. Start amitiza 24 one twice a day.   Meclizine sent for vertigo/car sickness.

## 2021-01-27 ENCOUNTER — Encounter: Payer: Self-pay | Admitting: Family Medicine

## 2021-01-27 ENCOUNTER — Other Ambulatory Visit: Payer: Self-pay

## 2021-01-27 ENCOUNTER — Ambulatory Visit (INDEPENDENT_AMBULATORY_CARE_PROVIDER_SITE_OTHER): Payer: Medicare Other | Admitting: Family Medicine

## 2021-01-27 VITALS — BP 140/70 | HR 72 | Temp 97.2°F | Resp 16 | Ht 69.0 in | Wt 169.0 lb

## 2021-01-27 DIAGNOSIS — R233 Spontaneous ecchymoses: Secondary | ICD-10-CM | POA: Diagnosis not present

## 2021-01-27 DIAGNOSIS — G894 Chronic pain syndrome: Secondary | ICD-10-CM | POA: Diagnosis not present

## 2021-01-27 DIAGNOSIS — K219 Gastro-esophageal reflux disease without esophagitis: Secondary | ICD-10-CM | POA: Diagnosis not present

## 2021-01-27 DIAGNOSIS — M791 Myalgia, unspecified site: Secondary | ICD-10-CM

## 2021-01-27 DIAGNOSIS — I10 Essential (primary) hypertension: Secondary | ICD-10-CM | POA: Diagnosis not present

## 2021-01-27 DIAGNOSIS — J449 Chronic obstructive pulmonary disease, unspecified: Secondary | ICD-10-CM

## 2021-01-27 DIAGNOSIS — E782 Mixed hyperlipidemia: Secondary | ICD-10-CM

## 2021-01-27 DIAGNOSIS — F17219 Nicotine dependence, cigarettes, with unspecified nicotine-induced disorders: Secondary | ICD-10-CM | POA: Diagnosis not present

## 2021-01-27 NOTE — Progress Notes (Signed)
Subjective:  Patient ID: Jacob Graves, male    DOB: [birth year]  Age: 65 y.o. MRN: [id number]  Chief Complaint  Patient presents with   Hypertension   Gastroesophageal Reflux   HPI: Chronic pain. Currently on 1/2 dose of suboxone, because started delta8 gummy once daily. He has notice a great improvement. On gabapentin 600 mg 3 in am and one at night. On ibuprofen 800 mg one three times daily prn. Usually 1-2 per day.   Tobacco: trying to quit smoking. Not going very well. Quit chantix due to nausea.  Hypertension: on propranolol and flomax.   COPD: On spiriva 2 puffs daily.  Hyperlipidemia: on lipitor 80 mg once daily.   Migraines: on propranolol 60 mg bid. Pt is concerned about bruising and and has eye irritation. Pt is overdue to see eye doctor.   OIC: amitiza 24 mg one twice a day and miralax as needed. Also still taking linzess.  Allergic rhinitis: on flonase and singulair.   BPH: on flomax 0.4 mg 2 at night.   GERD: on omeprazole 40 mg once daily.   Current Outpatient Medications on File Prior to Visit  Medication Sig Dispense Refill   Aspirin-Acetaminophen-Caffeine 500-325-65 MG PACK Take 325 mg by mouth 2 (two) times daily as needed.     atorvastatin (LIPITOR) 80 MG tablet TAKE 1 TABLET BY MOUTH  DAILY 90 tablet 3   Buprenorphine HCl-Naloxone HCl 8-2 MG FILM Place under the tongue.     fluticasone (FLONASE) 50 MCG/ACT nasal spray INHALE 1 SPRAY IN EACH NOSTRIL EVERY DAY 16 g 2   gabapentin (NEURONTIN) 600 MG tablet TAKE ONE TABLET BY MOUTH EVERY MORNING AT NOON  IN THE EVENING  AND AT BEDTIME 120 tablet 3   ibuprofen (ADVIL) 800 MG tablet TAKE 1 TABLET(800 MG) BY MOUTH THREE TIMES DAILY WITH FOOD 90 tablet 1   lubiprostone (AMITIZA) 24 MCG capsule Take 1 capsule (24 mcg total) by mouth 2 (two) times daily with a meal. 180 capsule 1   montelukast (SINGULAIR) 10 MG tablet TAKE 1 TABLET(10 MG) BY MOUTH AT BEDTIME 90 tablet 3   omeprazole (PRILOSEC) 40 MG capsule TAKE 1  CAPSULE BY MOUTH  DAILY BEFORE A MEAL 90 capsule 3   polyethylene glycol powder (GLYCOLAX/MIRALAX) 17 GM/SCOOP powder Take 17 g by mouth daily.     propranolol (INDERAL) 60 MG tablet TAKE 1 TABLET BY MOUTH  TWICE DAILY 180 tablet 3   tamsulosin (FLOMAX) 0.4 MG CAPS capsule TAKE 2 CAPSULES BY MOUTH  DAILY AFTER SUPPER 180 capsule 3   varenicline (CHANTIX) 1 MG tablet Take 1 tablet (1 mg total) by mouth 2 (two) times daily. 60 tablet 3   No current facility-administered medications on file prior to visit.   Past Medical History:  Diagnosis Date   Apnea    Arthritis    Arthritis    spinal arthritis   Cancer ([place])    melanoma, basal cell   Centrilobular emphysema ([place]) 07/08/2015   Depression    Emphysema of lung (HCC)    COPD   GERD (gastroesophageal reflux disease)    Hypercholesterolemia    Hypertension    Impaired fasting glucose    Kyphosis    mild   Migraine    Neuropathy    Osteoarthritis    Other testicular dysfunction    Scoliosis    Sleep apnea    Solitary pulmonary nodule    Tinnitus    Past Surgical History:  Procedure Laterality Date  COLONOSCOPY  2016   Colon polyps. Tubular Adenoma.   TONSILLECTOMY      Family History  Problem Relation Age of Onset   Heart disease Father    Stroke Father    Breast cancer Maternal Grandmother    Leukemia Mother    Skin cancer Mother    Diabetes Mother    Stroke Mother    Hypertension Mother    Skin cancer Brother    Cancer Brother        Melanoma   Hypertension Brother    Colon cancer Neg Hx    Social History   Socioeconomic History   Marital status: Married    Spouse name: Baker Janus   Number of children: 2   Years of education: 12   Highest education level: Not on file  Occupational History   Occupation: disabled  Tobacco Use   Smoking status: Every Day    Packs/day: 0.50    Types: Cigarettes    Start date: 1974   Smokeless tobacco: Never   Tobacco comments:    currently smoking 3/4 ppd. For years smoked  1 1/2 ppd   Vaping Use   Vaping Use: Never used  Substance and Sexual Activity   Alcohol use: No    Alcohol/week: 0.0 standard drinks   Drug use: No   Sexual activity: Not on file  Other Topics Concern   Not on file  Social History Narrative   Lives with wife   Caffeine- coffee, 3 cups daily   Social Determinants of Health   Financial Resource Strain: Not on file  Food Insecurity: Not on file  Transportation Needs: Not on file  Physical Activity: Not on file  Stress: Not on file  Social Connections: Not on file    Review of Systems  Constitutional:  Negative for chills and fever.  HENT:  Positive for congestion. Negative for rhinorrhea and sore throat.   Respiratory:  Positive for shortness of breath. Negative for cough.   Cardiovascular:  Negative for chest pain and palpitations.  Gastrointestinal:  Positive for constipation and diarrhea. Negative for abdominal pain, nausea and vomiting.  Genitourinary:  Negative for dysuria and urgency.  Musculoskeletal:  Positive for back pain and myalgias. Negative for arthralgias.  Neurological:  Negative for dizziness and headaches.  Psychiatric/Behavioral:  Negative for dysphoric mood. The patient is not nervous/anxious.     Objective:  BP 140/70   Pulse 72   Temp (!) 97.2 F (36.2 C)   Resp 16   Ht 5\' 9"  (1.753 m)   Wt 169 lb (76.7 kg)   BMI 24.96 kg/m   BP/Weight 01/27/2021 01/12/2021 3/57/0177  Systolic BP 939 030 092  Diastolic BP 70 82 70  Wt. (Lbs) 169 170 170.2  BMI 24.96 25.1 25.13    Physical Exam Vitals reviewed.  Constitutional:      Appearance: Normal appearance.  Neck:     Vascular: No carotid bruit.  Cardiovascular:     Rate and Rhythm: Normal rate and regular rhythm.     Heart sounds: Normal heart sounds.  Pulmonary:     Effort: Pulmonary effort is normal.     Breath sounds: Normal breath sounds. No wheezing, rhonchi or rales.  Abdominal:     General: Bowel sounds are normal.     Palpations:  Abdomen is soft.     Tenderness: There is no abdominal tenderness.  Skin:    Findings: Bruising present.  Neurological:     Mental Status: He is alert.  Psychiatric:        Mood and Affect: Mood normal.        Behavior: Behavior normal.    Diabetic Foot Exam - Simple   No data filed      Lab Results  Component Value Date   WBC 7.1 01/27/2021   HGB 13.6 01/27/2021   HCT 40.0 01/27/2021   PLT 209 01/27/2021   GLUCOSE 104 (H) 01/27/2021   CHOL 130 01/27/2021   TRIG 77 01/27/2021   HDL 45 01/27/2021   LDLCALC 70 01/27/2021   ALT 13 01/27/2021   AST 7 01/27/2021   NA 142 01/27/2021   K 4.4 01/27/2021   CL 103 01/27/2021   CREATININE 0.84 01/27/2021   BUN 13 01/27/2021   CO2 26 01/27/2021   TSH 1.780 10/20/2020   INR 1.0 01/27/2021      Assessment & Plan:   Problem List Items Addressed This Visit       Cardiovascular and Mediastinum   Essential hypertension, benign - Primary    Fairly well controlled. Slightly high.        Relevant Orders   CBC with Differential/Platelet (Completed)   Comprehensive metabolic panel (Completed)     Respiratory   COPD, moderate (HCC)    Continue spiriva. Continue albuterol. Recommend cessation        Digestive   Gastroesophageal reflux disease    The current medical regimen is effective;  continue present plan and medications. Continue omeprazole 40 mg once daily.        Nervous and Auditory   Cigarette nicotine dependence with nicotine-induced disorder    Recommended cessation.        Other   Chronic pain syndrome    Return to pain clinic to discuss.      Spontaneous bruising    Check labs.       Relevant Orders   Protime-INR (Completed)   Myalgia    Check labs.      Relevant Orders   Phosphorus (Completed)   Magnesium (Completed)   Mixed hyperlipidemia    The current medical regimen is effective;  continue present plan and medications. Continue lipitor 80 mg once daily      Relevant Orders    Lipid panel (Completed)  .  No orders of the defined types were placed in this encounter.   Orders Placed This Encounter  Procedures   CBC with Differential/Platelet   Comprehensive metabolic panel   Lipid panel   Phosphorus   Magnesium   Protime-INR     Follow-up: Return in about 3 months (around 04/28/2021) for chronic fasting.  An After Visit Summary was printed and given to the patient.  Rochel Brome, MD [facility] [phone]

## 2021-01-28 ENCOUNTER — Other Ambulatory Visit: Payer: Self-pay | Admitting: Family Medicine

## 2021-01-28 DIAGNOSIS — Z79899 Other long term (current) drug therapy: Secondary | ICD-10-CM | POA: Diagnosis not present

## 2021-01-28 LAB — LIPID PANEL
Chol/HDL Ratio: 2.9 ratio (ref 0.0–5.0)
Cholesterol, Total: 130 mg/dL (ref 100–199)
HDL: 45 mg/dL (ref >39)
LDL Chol Calc (NIH): 70 mg/dL (ref 0–99)
Triglycerides: 77 mg/dL (ref 0–149)
VLDL Cholesterol Cal: 15 mg/dL (ref 5–40)

## 2021-01-28 LAB — CBC WITH DIFFERENTIAL/PLATELET
Basophils Absolute: 0.1 10*3/uL (ref 0.0–0.2)
Basos: 1 %
EOS (ABSOLUTE): 0.2 10*3/uL (ref 0.0–0.4)
Eos: 3 %
Hematocrit: 40 % (ref 37.5–51.0)
Hemoglobin: 13.6 g/dL (ref 13.0–17.7)
Immature Grans (Abs): 0 10*3/uL (ref 0.0–0.1)
Immature Granulocytes: 0 %
Lymphocytes Absolute: 1.8 10*3/uL (ref 0.7–3.1)
Lymphs: 26 %
MCH: 30.2 pg (ref 26.6–33.0)
MCHC: 34 g/dL (ref 31.5–35.7)
MCV: 89 fL (ref 79–97)
Monocytes Absolute: 0.5 10*3/uL (ref 0.1–0.9)
Monocytes: 7 %
Neutrophils Absolute: 4.5 10*3/uL (ref 1.4–7.0)
Neutrophils: 63 %
Platelets: 209 10*3/uL (ref 150–450)
RBC: 4.51 x10E6/uL (ref 4.14–5.80)
RDW: 12.5 % (ref 11.6–15.4)
WBC: 7.1 10*3/uL (ref 3.4–10.8)

## 2021-01-28 LAB — COMPREHENSIVE METABOLIC PANEL
ALT: 13 IU/L (ref 0–44)
AST: 7 IU/L (ref 0–40)
Albumin/Globulin Ratio: 2.2 (ref 1.2–2.2)
Albumin: 4.4 g/dL (ref 3.8–4.8)
Alkaline Phosphatase: 132 IU/L — ABNORMAL HIGH (ref 44–121)
BUN/Creatinine Ratio: 15 (ref 10–24)
BUN: 13 mg/dL (ref 8–27)
Bilirubin Total: 0.3 mg/dL (ref 0.0–1.2)
CO2: 26 mmol/L (ref 20–29)
Calcium: 8.5 mg/dL — ABNORMAL LOW (ref 8.6–10.2)
Chloride: 103 mmol/L (ref 96–106)
Creatinine, Ser: 0.84 mg/dL (ref 0.76–1.27)
Globulin, Total: 2 g/dL (ref 1.5–4.5)
Glucose: 104 mg/dL — ABNORMAL HIGH (ref 70–99)
Potassium: 4.4 mmol/L (ref 3.5–5.2)
Sodium: 142 mmol/L (ref 134–144)
Total Protein: 6.4 g/dL (ref 6.0–8.5)
eGFR: 98 mL/min/{1.73_m2} (ref >59)

## 2021-01-28 LAB — PROTIME-INR
INR: 1 (ref 0.9–1.2)
Prothrombin Time: 10.6 s (ref 9.1–12.0)

## 2021-01-28 LAB — MAGNESIUM: Magnesium: 2.6 mg/dL — ABNORMAL HIGH (ref 1.6–2.3)

## 2021-01-28 LAB — PHOSPHORUS: Phosphorus: 4.2 mg/dL — ABNORMAL HIGH (ref 2.8–4.1)

## 2021-02-02 ENCOUNTER — Other Ambulatory Visit: Payer: Self-pay | Admitting: Family Medicine

## 2021-02-02 DIAGNOSIS — J449 Chronic obstructive pulmonary disease, unspecified: Secondary | ICD-10-CM

## 2021-02-06 ENCOUNTER — Other Ambulatory Visit: Payer: Self-pay | Admitting: Family Medicine

## 2021-02-08 DIAGNOSIS — R42 Dizziness and giddiness: Secondary | ICD-10-CM | POA: Insufficient documentation

## 2021-02-08 DIAGNOSIS — R112 Nausea with vomiting, unspecified: Secondary | ICD-10-CM | POA: Insufficient documentation

## 2021-02-08 DIAGNOSIS — K5903 Drug induced constipation: Secondary | ICD-10-CM | POA: Insufficient documentation

## 2021-02-08 DIAGNOSIS — T402X5A Adverse effect of other opioids, initial encounter: Secondary | ICD-10-CM | POA: Insufficient documentation

## 2021-02-08 NOTE — Assessment & Plan Note (Signed)
Stop Linzess.  Start on Amitiza 24 mcg twice daily.

## 2021-02-08 NOTE — Assessment & Plan Note (Signed)
Prescription for meclizine given.

## 2021-02-08 NOTE — Assessment & Plan Note (Signed)
Chest pain clinic.  Currently he is stopped his Suboxone and is only taking the THC Gummies.

## 2021-02-10 ENCOUNTER — Ambulatory Visit: Payer: Medicare Other

## 2021-02-11 ENCOUNTER — Telehealth: Payer: Self-pay

## 2021-02-11 DIAGNOSIS — R233 Spontaneous ecchymoses: Secondary | ICD-10-CM | POA: Insufficient documentation

## 2021-02-11 DIAGNOSIS — M791 Myalgia, unspecified site: Secondary | ICD-10-CM | POA: Insufficient documentation

## 2021-02-11 DIAGNOSIS — E782 Mixed hyperlipidemia: Secondary | ICD-10-CM | POA: Insufficient documentation

## 2021-02-11 NOTE — Assessment & Plan Note (Signed)
Return to pain clinic to discuss.

## 2021-02-11 NOTE — Assessment & Plan Note (Signed)
The current medical regimen is effective;  continue present plan and medications. Continue omeprazole 40 mg once daily.

## 2021-02-11 NOTE — Assessment & Plan Note (Signed)
Check labs 

## 2021-02-11 NOTE — Assessment & Plan Note (Signed)
Recommended cessation.  

## 2021-02-11 NOTE — Assessment & Plan Note (Signed)
Fairly well controlled. Slightly high.

## 2021-02-11 NOTE — Assessment & Plan Note (Signed)
The current medical regimen is effective;  continue present plan and medications. Continue lipitor 80 mg once daily

## 2021-02-11 NOTE — Assessment & Plan Note (Addendum)
Continue spiriva. Continue albuterol. Recommend cessation

## 2021-02-11 NOTE — Progress Notes (Signed)
° ° °  Chronic Care Management Pharmacy Assistant   Name: Jacob Graves  MRN: [id number] DOB: 19-Mar-1955   Reason for Encounter: Disease State COPD Adherence Call   Conditions to be addressed/monitored:COPD   Recent office visits:   01/27/2021 Rochel Brome MD (PCP)   01/12/2021 Rochel Brome MD (PCP) start Lubiprostone 24 mcg oral 2 times daily with meals and Meclizine 25 mg Oral 3 times a daily prn, stop Linaclotide 145 mcg   10/20/2020 Rochel Brome (PCP) stop Sulfamethoxazole Trimethoprim    Recent consult visits:   01/29/2021 [place] ( [place])   Hospital visits:  None in previous 6 months  Medications: Outpatient Encounter Medications as of 02/11/2021  Medication Sig   Aspirin-Acetaminophen-Caffeine 500-325-65 MG PACK Take 325 mg by mouth 2 (two) times daily as needed.   atorvastatin (LIPITOR) 80 MG tablet TAKE 1 TABLET BY MOUTH  DAILY   Buprenorphine HCl-Naloxone HCl 8-2 MG FILM Place under the tongue.   fluticasone (FLONASE) 50 MCG/ACT nasal spray INHALE 1 SPRAY IN EACH NOSTRIL EVERY DAY   gabapentin (NEURONTIN) 600 MG tablet TAKE ONE TABLET BY MOUTH EVERY MORNING AT NOON  IN THE EVENING  AND AT BEDTIME   ibuprofen (ADVIL) 800 MG tablet TAKE 1 TABLET(800 MG) BY MOUTH THREE TIMES DAILY WITH FOOD   lubiprostone (AMITIZA) 24 MCG capsule Take 1 capsule (24 mcg total) by mouth 2 (two) times daily with a meal.   meclizine (ANTIVERT) 25 MG tablet TAKE ONE TABLET BY MOUTH 3 TIMES A DAY AS NEEDED FOR DIZZINESS   montelukast (SINGULAIR) 10 MG tablet TAKE 1 TABLET(10 MG) BY MOUTH AT BEDTIME   omeprazole (PRILOSEC) 40 MG capsule TAKE 1 CAPSULE BY MOUTH  DAILY BEFORE A MEAL   ondansetron (ZOFRAN) 4 MG tablet TAKE ONE TABLET BY MOUTH EVERY 8 HOURS AS NEEDED FOR NAUSEA/VOMITING   polyethylene glycol powder (GLYCOLAX/MIRALAX) 17 GM/SCOOP powder Take 17 g by mouth daily.   PROAIR HFA 108 (90 Base) MCG/ACT inhaler INHALE 2 PUFFS BY MOUTH EVERY 6 HOURS ASNEEDED FOR SHORT  OF BREATH OR WHEEZING   propranolol (INDERAL) 60 MG tablet TAKE 1 TABLET BY MOUTH  TWICE DAILY   SPIRIVA RESPIMAT 2.5 MCG/ACT AERS INHALE 2 PUFFS BY MOUTH EVERY DAY   tamsulosin (FLOMAX) 0.4 MG CAPS capsule TAKE 2 CAPSULES BY MOUTH  DAILY AFTER SUPPER   varenicline (CHANTIX) 1 MG tablet Take 1 tablet (1 mg total) by mouth 2 (two) times daily.   No facility-administered encounter medications on file as of 02/11/2021.     Called patient 02/11/2021  left message Called patient 02/13/2021 left message Called patient 02/17/2021 left message   Care Gaps: Last annual wellness visit- N/A  Star Rating Drugs:  Atorvastatin 80 mg          8/31, 11/24         90 DS  Cherlyn Labella Clinical Pharmacist Assistant [phone]

## 2021-02-27 ENCOUNTER — Other Ambulatory Visit: Payer: Self-pay | Admitting: Family Medicine

## 2021-03-02 ENCOUNTER — Other Ambulatory Visit: Payer: Self-pay | Admitting: Physician Assistant

## 2021-03-02 DIAGNOSIS — J301 Allergic rhinitis due to pollen: Secondary | ICD-10-CM

## 2021-03-09 ENCOUNTER — Telehealth: Payer: Self-pay

## 2021-03-09 NOTE — Progress Notes (Signed)
Chronic Care Management Pharmacy Assistant   Name: Jacob Graves  MRN: [id number] DOB: May 21, 1955   Reason for Encounter: Disease State/ Hypertension Adherence Call    Recent office visits: None  Recent consult visits: None  Hospital visits:  None in previous 6 months  Medications: Outpatient Encounter Medications as of 03/09/2021  Medication Sig   Aspirin-Acetaminophen-Caffeine 500-325-65 MG PACK Take 325 mg by mouth 2 (two) times daily as needed.   atorvastatin (LIPITOR) 80 MG tablet TAKE 1 TABLET BY MOUTH  DAILY   Buprenorphine HCl-Naloxone HCl 8-2 MG FILM Place under the tongue.   fluticasone (FLONASE) 50 MCG/ACT nasal spray INHALE 1 SPRAY IN EACH NOSTRIL EVERY DAY   gabapentin (NEURONTIN) 600 MG tablet TAKE ONE TABLET BY MOUTH EVERY MORNING AT NOON  IN THE EVENING  AND AT BEDTIME   ibuprofen (ADVIL) 800 MG tablet TAKE 1 TABLET(800 MG) BY MOUTH THREE TIMES DAILY WITH FOOD   lubiprostone (AMITIZA) 24 MCG capsule Take 1 capsule (24 mcg total) by mouth 2 (two) times daily with a meal.   meclizine (ANTIVERT) 25 MG tablet TAKE ONE TABLET BY MOUTH 3 TIMES A DAY AS NEEDED FOR DIZZINESS   montelukast (SINGULAIR) 10 MG tablet TAKE 1 TABLET(10 MG) BY MOUTH AT BEDTIME   omeprazole (PRILOSEC) 40 MG capsule TAKE 1 CAPSULE BY MOUTH  DAILY BEFORE A MEAL   ondansetron (ZOFRAN) 4 MG tablet TAKE ONE TABLET BY MOUTH EVERY 8 HOURS AS NEEDED FOR NAUSEA/VOMITING   polyethylene glycol powder (GLYCOLAX/MIRALAX) 17 GM/SCOOP powder Take 17 g by mouth daily.   PROAIR HFA 108 (90 Base) MCG/ACT inhaler INHALE 2 PUFFS BY MOUTH EVERY 6 HOURS ASNEEDED FOR SHORT OF BREATH OR WHEEZING   propranolol (INDERAL) 60 MG tablet TAKE 1 TABLET BY MOUTH  TWICE DAILY   SPIRIVA RESPIMAT 2.5 MCG/ACT AERS INHALE 2 PUFFS BY MOUTH EVERY DAY   tamsulosin (FLOMAX) 0.4 MG CAPS capsule TAKE 2 CAPSULES BY MOUTH  DAILY AFTER SUPPER   varenicline (CHANTIX) 1 MG tablet Take 1 tablet (1 mg total) by mouth 2 (two) times daily.   No  facility-administered encounter medications on file as of 03/09/2021.    Recent Office Vitals: BP Readings from Last 3 Encounters:  01/27/21 140/70  01/12/21 124/82  10/20/20 138/70   Pulse Readings from Last 3 Encounters:  01/27/21 72  01/12/21 71  10/20/20 72    Wt Readings from Last 3 Encounters:  01/27/21 169 lb (76.7 kg)  01/12/21 170 lb (77.1 kg)  10/20/20 170 lb 3.2 oz (77.2 kg)     Kidney Function Lab Results  Component Value Date/Time   CREATININE 0.84 01/27/2021 08:14 AM   CREATININE 0.76 10/20/2020 10:39 AM   GFR 95.22 07/15/2017 03:55 PM   GFRNONAA 100 12/19/2019 11:09 AM   GFRAA 116 12/19/2019 11:09 AM    BMP Latest Ref Rng & Units 01/27/2021 10/20/2020 12/19/2019  Glucose 70 - 99 mg/dL 104(H) 92 93  BUN 8 - 27 mg/dL 13 14 11   Creatinine 0.76 - 1.27 mg/dL 0.84 0.76 0.72(L)  BUN/Creat Ratio 10 - 24 15 18 15   Sodium 134 - 144 mmol/L 142 142 141  Potassium 3.5 - 5.2 mmol/L 4.4 4.3 5.0  Chloride 96 - 106 mmol/L 103 99 104  CO2 20 - 29 mmol/L 26 29 28   Calcium 8.6 - 10.2 mg/dL 8.5(L) 8.8 9.0    Called patient 03/09/2021 left message. Called patient 03/12/2021 left message. Called patient 03/13/2021 left message.  Care Gaps: Last annual  wellness visit N/A  Star Rating Drugs:  Medication:  Last Fill: Day Supply   Atorvastatin 80 mg          8/31, 11/24         90 DS   Cherlyn Labella Clinical Pharmacist Assistant [phone]

## 2021-03-23 ENCOUNTER — Other Ambulatory Visit: Payer: Self-pay | Admitting: Family Medicine

## 2021-03-25 DIAGNOSIS — Z79899 Other long term (current) drug therapy: Secondary | ICD-10-CM | POA: Diagnosis not present

## 2021-04-22 DIAGNOSIS — Z79899 Other long term (current) drug therapy: Secondary | ICD-10-CM | POA: Diagnosis not present

## 2021-04-29 ENCOUNTER — Ambulatory Visit: Payer: Medicare Other | Admitting: Family Medicine

## 2021-05-04 DIAGNOSIS — K649 Unspecified hemorrhoids: Secondary | ICD-10-CM | POA: Diagnosis not present

## 2021-05-04 DIAGNOSIS — K219 Gastro-esophageal reflux disease without esophagitis: Secondary | ICD-10-CM | POA: Diagnosis not present

## 2021-05-04 DIAGNOSIS — K5900 Constipation, unspecified: Secondary | ICD-10-CM | POA: Diagnosis not present

## 2021-05-08 ENCOUNTER — Other Ambulatory Visit: Payer: Self-pay | Admitting: Family Medicine

## 2021-05-08 DIAGNOSIS — J449 Chronic obstructive pulmonary disease, unspecified: Secondary | ICD-10-CM

## 2021-05-08 NOTE — Telephone Encounter (Signed)
Refill sent to pharmacy.   

## 2021-05-18 ENCOUNTER — Other Ambulatory Visit: Payer: Self-pay | Admitting: Family Medicine

## 2021-05-20 DIAGNOSIS — Z79899 Other long term (current) drug therapy: Secondary | ICD-10-CM | POA: Diagnosis not present

## 2021-06-01 ENCOUNTER — Other Ambulatory Visit: Payer: Self-pay | Admitting: Family Medicine

## 2021-06-02 DIAGNOSIS — Z1211 Encounter for screening for malignant neoplasm of colon: Secondary | ICD-10-CM | POA: Diagnosis not present

## 2021-06-02 DIAGNOSIS — K5730 Diverticulosis of large intestine without perforation or abscess without bleeding: Secondary | ICD-10-CM | POA: Diagnosis not present

## 2021-06-02 DIAGNOSIS — Z86010 Personal history of colonic polyps: Secondary | ICD-10-CM | POA: Diagnosis not present

## 2021-06-02 DIAGNOSIS — I10 Essential (primary) hypertension: Secondary | ICD-10-CM | POA: Diagnosis not present

## 2021-06-02 DIAGNOSIS — J449 Chronic obstructive pulmonary disease, unspecified: Secondary | ICD-10-CM | POA: Diagnosis not present

## 2021-06-02 DIAGNOSIS — K635 Polyp of colon: Secondary | ICD-10-CM | POA: Diagnosis not present

## 2021-06-02 DIAGNOSIS — F17210 Nicotine dependence, cigarettes, uncomplicated: Secondary | ICD-10-CM | POA: Diagnosis not present

## 2021-06-02 DIAGNOSIS — D125 Benign neoplasm of sigmoid colon: Secondary | ICD-10-CM | POA: Diagnosis not present

## 2021-06-02 DIAGNOSIS — Z85820 Personal history of malignant melanoma of skin: Secondary | ICD-10-CM | POA: Diagnosis not present

## 2021-06-02 DIAGNOSIS — D126 Benign neoplasm of colon, unspecified: Secondary | ICD-10-CM | POA: Diagnosis not present

## 2021-06-02 LAB — HM COLONOSCOPY

## 2021-06-05 ENCOUNTER — Encounter: Payer: Self-pay | Admitting: Family Medicine

## 2021-06-12 ENCOUNTER — Other Ambulatory Visit: Payer: Self-pay | Admitting: Family Medicine

## 2021-06-15 ENCOUNTER — Other Ambulatory Visit: Payer: Self-pay | Admitting: Family Medicine

## 2021-06-21 ENCOUNTER — Other Ambulatory Visit: Payer: Self-pay | Admitting: Family Medicine

## 2021-06-22 NOTE — Telephone Encounter (Signed)
Refill sent to pharmacy.   

## 2021-06-24 ENCOUNTER — Other Ambulatory Visit: Payer: Self-pay

## 2021-06-24 MED ORDER — IBUPROFEN 800 MG PO TABS: 800.0000 mg | ORAL_TABLET | Freq: Three times a day (TID) | ORAL | 1 refills | Status: DC

## 2021-06-24 MED ORDER — FEXOFENADINE HCL 180 MG PO TABS: 180.0000 mg | ORAL_TABLET | Freq: Every day | ORAL | 3 refills | Status: AC

## 2021-07-06 ENCOUNTER — Other Ambulatory Visit: Payer: Self-pay | Admitting: Family Medicine

## 2021-07-07 ENCOUNTER — Telehealth: Payer: Self-pay

## 2021-07-07 NOTE — Chronic Care Management (AMB) (Signed)
    Chronic Care Management Pharmacy Assistant   Name: STAN CANTAVE  MRN: [id number] DOB: 11/06/1955  Reason for Encounter: Prior Authorization Coordination  07/07/2021- Prior Authorization created in Covermymeds for  Fexofenadine, awaiting determination from [facility]. Denied today Reference Number: [id number]. ALLERGY RELF TAB '180MG'$  is denied for not meeting the prior authorization requirement(s). Per [employer] Over-The-Counter (OTC) or non-prescription drugs are excluded from coverage under Medicare rules. Please refer to your Evidence of Coverage (EOC) document that details your Medicare prescription drug coverage.    Medications: Outpatient Encounter Medications as of 07/07/2021  Medication Sig   Aspirin-Acetaminophen-Caffeine 500-325-65 MG PACK Take 325 mg by mouth 2 (two) times daily as needed.   atorvastatin (LIPITOR) 80 MG tablet TAKE 1 TABLET BY MOUTH  DAILY   Buprenorphine HCl-Naloxone HCl 8-2 MG FILM Place under the tongue.   fexofenadine (ALLEGRA) 180 MG tablet Take 1 tablet (180 mg total) by mouth daily.   fluticasone (FLONASE) 50 MCG/ACT nasal spray INHALE 1 SPRAY IN EACH NOSTRIL EVERY DAY   gabapentin (NEURONTIN) 600 MG tablet TAKE ONE TABLET BY MOUTH EVERY MORNING AT NOON  IN THE EVENING  AND AT BEDTIME   ibuprofen (ADVIL) 800 MG tablet Take 1 tablet (800 mg total) by mouth 3 (three) times daily.   lubiprostone (AMITIZA) 24 MCG capsule Take 1 capsule (24 mcg total) by mouth 2 (two) times daily with a meal.   meclizine (ANTIVERT) 25 MG tablet TAKE ONE TABLET BY MOUTH 3 TIMES A DAY AS NEEDED FOR DIZZINESS   montelukast (SINGULAIR) 10 MG tablet TAKE 1 TABLET(10 MG) BY MOUTH AT BEDTIME   omeprazole (PRILOSEC) 40 MG capsule TAKE 1 CAPSULE BY MOUTH  DAILY BEFORE A MEAL   ondansetron (ZOFRAN) 4 MG tablet TAKE ONE TABLET BY MOUTH EVERY 8 HOURS AS NEEDED FOR NAUSEA/VOMITING   polyethylene glycol powder (GLYCOLAX/MIRALAX) 17 GM/SCOOP powder Take 17 g by mouth daily.   PROAIR HFA  108 (90 Base) MCG/ACT inhaler INHALE 2 PUFFS BY MOUTH EVERY 6 HOURS ASNEEDED FOR SHORT OF BREATH OR WHEEZING   propranolol (INDERAL) 60 MG tablet TAKE 1 TABLET BY MOUTH  TWICE DAILY   SPIRIVA RESPIMAT 2.5 MCG/ACT AERS INHALE 2 PUFFS BY MOUTH EVERY DAY   tamsulosin (FLOMAX) 0.4 MG CAPS capsule TAKE 2 CAPSULES BY MOUTH  DAILY AFTER SUPPER   varenicline (CHANTIX) 1 MG tablet Take 1 tablet (1 mg total) by mouth 2 (two) times daily.   No facility-administered encounter medications on file as of 07/07/2021.   Pattricia Boss, [place] Pharmacist Assistant [phone]

## 2021-07-08 DIAGNOSIS — Z79899 Other long term (current) drug therapy: Secondary | ICD-10-CM | POA: Diagnosis not present

## 2021-07-21 ENCOUNTER — Other Ambulatory Visit: Payer: Self-pay | Admitting: Physician Assistant

## 2021-07-21 DIAGNOSIS — J301 Allergic rhinitis due to pollen: Secondary | ICD-10-CM

## 2021-07-31 ENCOUNTER — Other Ambulatory Visit: Payer: Self-pay | Admitting: Family Medicine

## 2021-08-10 ENCOUNTER — Other Ambulatory Visit: Payer: Self-pay | Admitting: Family Medicine

## 2021-08-15 ENCOUNTER — Other Ambulatory Visit: Payer: Self-pay | Admitting: Family Medicine

## 2021-08-19 ENCOUNTER — Ambulatory Visit (INDEPENDENT_AMBULATORY_CARE_PROVIDER_SITE_OTHER): Payer: Medicare Other | Admitting: Family Medicine

## 2021-08-19 ENCOUNTER — Encounter: Payer: Self-pay | Admitting: Family Medicine

## 2021-08-19 VITALS — BP 120/70 | HR 76 | Temp 97.3°F | Resp 18 | Ht 70.0 in | Wt 162.0 lb

## 2021-08-19 DIAGNOSIS — M5441 Lumbago with sciatica, right side: Secondary | ICD-10-CM | POA: Diagnosis not present

## 2021-08-19 DIAGNOSIS — G8929 Other chronic pain: Secondary | ICD-10-CM | POA: Diagnosis not present

## 2021-08-19 DIAGNOSIS — S20412A Abrasion of left back wall of thorax, initial encounter: Secondary | ICD-10-CM | POA: Diagnosis not present

## 2021-08-19 DIAGNOSIS — W182XXA Fall in (into) shower or empty bathtub, initial encounter: Secondary | ICD-10-CM | POA: Insufficient documentation

## 2021-08-19 NOTE — Assessment & Plan Note (Signed)
Refer to physical therapy 

## 2021-08-19 NOTE — Assessment & Plan Note (Signed)
Recommended caution.  refer to physical therapy for evaluation and treatment to help prevent falls in the future.

## 2021-08-19 NOTE — Progress Notes (Signed)
Acute Office Visit  Subjective:    Patient ID: Jacob Graves, male    DOB: [birth year], 66 y.o.   MRN: [id number]  Chief Complaint  Patient presents with   Back Pain   Numbness    Right leg     HPI: Patient is in today following a fall in the shower.  He scraped the left side of his back.  He had no other significant injuries.  She does report that he has been having increased leg weakness bilaterally and at times will have burning down the right leg.  Patient has extensive history of back issues and has been seen by [facility].  He was on chronic pain medicines for years and now goes to the [facility] clinic.  He has ibuprofen that he will take 800 mg 3 times daily as needed.  He also has gabapentin 600 mg 4 times daily.  Past Medical History:  Diagnosis Date   Apnea    Arthritis    Arthritis    spinal arthritis   Cancer ([place])    melanoma, basal cell   Centrilobular emphysema ([place]) 07/08/2015   Depression    Emphysema of lung ([facility])    COPD   GERD (gastroesophageal reflux disease)    Hypercholesterolemia    Hypertension    Impaired fasting glucose    Kyphosis    mild   Migraine    Neuropathy    Osteoarthritis    Other testicular dysfunction    Scoliosis    Sleep apnea    Solitary pulmonary nodule    Tinnitus     Past Surgical History:  Procedure Laterality Date   COLONOSCOPY  2016   Colon polyps. Tubular Adenoma.   TONSILLECTOMY      Family History  Problem Relation Age of Onset   Heart disease Father    Stroke Father    Breast cancer Maternal Grandmother    Leukemia Mother    Skin cancer Mother    Diabetes Mother    Stroke Mother    Hypertension Mother    Skin cancer Brother    Cancer Brother        Melanoma   Hypertension Brother    Colon cancer Neg Hx     Social History   Socioeconomic History   Marital status: Married    Spouse name: Baker Janus   Number of children: 2   Years of education: 12   Highest education level: Not on  file  Occupational History   Occupation: disabled  Tobacco Use   Smoking status: Every Day    Packs/day: 0.50    Types: Cigarettes    Start date: 1974   Smokeless tobacco: Never   Tobacco comments:    currently smoking 3/4 ppd. For years smoked 1 1/2 ppd   Vaping Use   Vaping Use: Never used  Substance and Sexual Activity   Alcohol use: No    Alcohol/week: 0.0 standard drinks of alcohol   Drug use: No   Sexual activity: Not on file  Other Topics Concern   Not on file  Social History Narrative   Lives with wife   Caffeine- coffee, 3 cups daily   Social Determinants of Health   Financial Resource Strain: Not on file  Food Insecurity: No Food Insecurity (07/24/2019)   Hunger Vital Sign    Worried About Running Out of Food in the Last Year: Never true    Ran Out of Food in the Last Year: Never  true  Transportation Needs: No Transportation Needs (07/24/2019)   PRAPARE - Hydrologist (Medical): No    Lack of Transportation (Non-Medical): No  Physical Activity: Not on file  Stress: Not on file  Social Connections: Not on file  Intimate Partner Violence: Not on file    Outpatient Medications Prior to Visit  Medication Sig Dispense Refill   Aspirin-Acetaminophen-Caffeine 500-325-65 MG PACK Take 325 mg by mouth 2 (two) times daily as needed.     atorvastatin (LIPITOR) 80 MG tablet TAKE 1 TABLET BY MOUTH  DAILY 90 tablet 3   Buprenorphine HCl-Naloxone HCl 8-2 MG FILM Place under the tongue.     fexofenadine (ALLEGRA) 180 MG tablet Take 1 tablet (180 mg total) by mouth daily. 90 tablet 3   fluticasone (FLONASE) 50 MCG/ACT nasal spray INHALE 1 SPRAY IN EACH NOSTRIL EVERY DAY 16 g 2   gabapentin (NEURONTIN) 600 MG tablet TAKE ONE TABLET BY MOUTH EVERY MORNING AT NOON  IN THE EVENING  AND AT BEDTIME 360 tablet 3   ibuprofen (ADVIL) 800 MG tablet Take 1 tablet (800 mg total) by mouth 3 (three) times daily. 270 tablet 1   meclizine (ANTIVERT) 25 MG tablet  TAKE ONE TABLET BY MOUTH 3 TIMES A DAY AS NEEDED FOR DIZZINESS 30 tablet 1   montelukast (SINGULAIR) 10 MG tablet TAKE 1 TABLET(10 MG) BY MOUTH AT BEDTIME 90 tablet 3   omeprazole (PRILOSEC) 40 MG capsule TAKE 1 CAPSULE BY MOUTH  DAILY BEFORE A MEAL 100 capsule 2   ondansetron (ZOFRAN) 4 MG tablet TAKE ONE TABLET BY MOUTH EVERY 8 HOURS AS NEEDED FOR NAUSEA/VOMITING 60 tablet 1   polyethylene glycol powder (GLYCOLAX/MIRALAX) 17 GM/SCOOP powder Take 17 g by mouth daily.     PROAIR HFA 108 (90 Base) MCG/ACT inhaler INHALE 2 PUFFS BY MOUTH EVERY 6 HOURS ASNEEDED FOR SHORT OF BREATH OR WHEEZING 8.5 g 1   propranolol (INDERAL) 60 MG tablet TAKE 1 TABLET BY MOUTH  TWICE DAILY 180 tablet 3   SPIRIVA RESPIMAT 2.5 MCG/ACT AERS INHALE 2 PUFFS BY MOUTH EVERY DAY 4 g 2   tamsulosin (FLOMAX) 0.4 MG CAPS capsule TAKE 2 CAPSULES BY MOUTH  DAILY AFTER SUPPER 180 capsule 3   varenicline (CHANTIX) 1 MG tablet Take 1 tablet (1 mg total) by mouth 2 (two) times daily. 60 tablet 3   lubiprostone (AMITIZA) 24 MCG capsule Take 1 capsule (24 mcg total) by mouth 2 (two) times daily with a meal. 180 capsule 1   No facility-administered medications prior to visit.    Allergies  Allergen Reactions   Codeine Nausea Only and Nausea And Vomiting   Duloxetine     Loss of appetite   Fentanyl     Fatigue    Fluoxetine     Agitation   Ipratropium-Albuterol     Myalgia   Penicillins Itching and Other (See Comments)    As child    Zoloft [Sertraline]     Sweating     Review of Systems  Constitutional:  Positive for fatigue. Negative for chills and fever.  Respiratory:  Negative for cough and shortness of breath.   Cardiovascular:  Negative for chest pain.  Musculoskeletal:  Positive for back pain.  Skin:        Large abrasion left flank  Neurological:  Positive for weakness and numbness (left leg and both feet).       Objective:    Physical Exam Vitals reviewed.  Constitutional:  Appearance: Normal  appearance.  Cardiovascular:     Rate and Rhythm: Normal rate and regular rhythm.     Pulses: Normal pulses.     Heart sounds: Normal heart sounds.  Pulmonary:     Effort: Pulmonary effort is normal.     Breath sounds: Normal breath sounds. No wheezing, rhonchi or rales.  Abdominal:     Tenderness: There is no abdominal tenderness.  Musculoskeletal:     Comments: Leg strength BL 5/5. Right mil SLR.   Skin:    Findings: No lesion (large abrasion size of volley ball. mildly tender. rest of back is nontender.).  Neurological:     Mental Status: He is alert.  Psychiatric:        Mood and Affect: Mood normal.        Behavior: Behavior normal.     BP 120/70   Pulse 76   Temp (!) 97.3 F (36.3 C)   Resp 18   Ht 5' 10"  (1.778 m)   Wt 162 lb (73.5 kg)   BMI 23.24 kg/m  Wt Readings from Last 3 Encounters:  08/19/21 162 lb (73.5 kg)  01/27/21 169 lb (76.7 kg)  01/12/21 170 lb (77.1 kg)    Health Maintenance Due  Topic Date Due   HIV Screening  Never done   Hepatitis C Screening  Never done   Zoster Vaccines- Shingrix (1 of 2) Never done    There are no preventive care reminders to display for this patient.   Lab Results  Component Value Date   TSH 1.780 10/20/2020   Lab Results  Component Value Date   WBC 7.1 01/27/2021   HGB 13.6 01/27/2021   HCT 40.0 01/27/2021   MCV 89 01/27/2021   PLT 209 01/27/2021   Lab Results  Component Value Date   NA 142 01/27/2021   K 4.4 01/27/2021   CO2 26 01/27/2021   GLUCOSE 104 (H) 01/27/2021   BUN 13 01/27/2021   CREATININE 0.84 01/27/2021   BILITOT 0.3 01/27/2021   ALKPHOS 132 (H) 01/27/2021   AST 7 01/27/2021   ALT 13 01/27/2021   PROT 6.4 01/27/2021   ALBUMIN 4.4 01/27/2021   CALCIUM 8.5 (L) 01/27/2021   EGFR 98 01/27/2021   GFR 95.22 07/15/2017   Lab Results  Component Value Date   CHOL 130 01/27/2021   Lab Results  Component Value Date   HDL 45 01/27/2021   Lab Results  Component Value Date   LDLCALC  70 01/27/2021   Lab Results  Component Value Date   TRIG 77 01/27/2021   Lab Results  Component Value Date   CHOLHDL 2.9 01/27/2021   No results found for: "HGBA1C"     Assessment & Plan:   Problem List Items Addressed This Visit       Nervous and Auditory   Chronic right-sided low back pain with right-sided sciatica - Primary    Refer to physical therapy.      Relevant Orders   Ambulatory referral to Physical Therapy     Musculoskeletal and Integument   Abrasion of left side of back    Recommend alternate ice and cold every 20 minutes as tolerated. Recommend start ibuprofen 800 mg 3 times daily scheduled rather than as needed.        Other   Fall in shower    Recommended caution.  refer to physical therapy for evaluation and treatment to help prevent falls in the future.      No  orders of the defined types were placed in this encounter.   Orders Placed This Encounter  Procedures   Ambulatory referral to Physical Therapy     Follow-up: Return in about 6 weeks (around 09/30/2021) for chronic fasting.  An After Visit Summary was printed and given to the patient.  Rochel Brome, MD [facility] [phone]

## 2021-08-19 NOTE — Patient Instructions (Signed)
Recommend ibuprofen 800 mg 3 times a day. Recommend alternate ice and heat. Refer to physical therapy for leg weakness and some right-sided sciatica.

## 2021-08-19 NOTE — Assessment & Plan Note (Signed)
Recommend alternate ice and cold every 20 minutes as tolerated. Recommend start ibuprofen 800 mg 3 times daily scheduled rather than as needed.

## 2021-08-22 DIAGNOSIS — S60222A Contusion of left hand, initial encounter: Secondary | ICD-10-CM | POA: Diagnosis not present

## 2021-08-24 ENCOUNTER — Other Ambulatory Visit: Payer: Self-pay | Admitting: Family Medicine

## 2021-08-24 DIAGNOSIS — J449 Chronic obstructive pulmonary disease, unspecified: Secondary | ICD-10-CM

## 2021-08-25 DIAGNOSIS — M6281 Muscle weakness (generalized): Secondary | ICD-10-CM | POA: Diagnosis not present

## 2021-08-25 DIAGNOSIS — M5441 Lumbago with sciatica, right side: Secondary | ICD-10-CM | POA: Diagnosis not present

## 2021-08-25 DIAGNOSIS — G8929 Other chronic pain: Secondary | ICD-10-CM | POA: Diagnosis not present

## 2021-08-26 DIAGNOSIS — Z6824 Body mass index (BMI) 24.0-24.9, adult: Secondary | ICD-10-CM | POA: Diagnosis not present

## 2021-08-26 DIAGNOSIS — Z79899 Other long term (current) drug therapy: Secondary | ICD-10-CM | POA: Diagnosis not present

## 2021-08-27 DIAGNOSIS — M6281 Muscle weakness (generalized): Secondary | ICD-10-CM | POA: Diagnosis not present

## 2021-08-27 DIAGNOSIS — G8929 Other chronic pain: Secondary | ICD-10-CM | POA: Diagnosis not present

## 2021-08-27 DIAGNOSIS — M5441 Lumbago with sciatica, right side: Secondary | ICD-10-CM | POA: Diagnosis not present

## 2021-09-02 DIAGNOSIS — M6281 Muscle weakness (generalized): Secondary | ICD-10-CM | POA: Diagnosis not present

## 2021-09-02 DIAGNOSIS — G8929 Other chronic pain: Secondary | ICD-10-CM | POA: Diagnosis not present

## 2021-09-02 DIAGNOSIS — M5441 Lumbago with sciatica, right side: Secondary | ICD-10-CM | POA: Diagnosis not present

## 2021-09-07 DIAGNOSIS — G8929 Other chronic pain: Secondary | ICD-10-CM | POA: Diagnosis not present

## 2021-09-07 DIAGNOSIS — M6281 Muscle weakness (generalized): Secondary | ICD-10-CM | POA: Diagnosis not present

## 2021-09-07 DIAGNOSIS — M5441 Lumbago with sciatica, right side: Secondary | ICD-10-CM | POA: Diagnosis not present

## 2021-09-10 DIAGNOSIS — G8929 Other chronic pain: Secondary | ICD-10-CM | POA: Diagnosis not present

## 2021-09-10 DIAGNOSIS — M5441 Lumbago with sciatica, right side: Secondary | ICD-10-CM | POA: Diagnosis not present

## 2021-09-10 DIAGNOSIS — M6281 Muscle weakness (generalized): Secondary | ICD-10-CM | POA: Diagnosis not present

## 2021-09-17 ENCOUNTER — Other Ambulatory Visit: Payer: Self-pay | Admitting: Family Medicine

## 2021-09-17 DIAGNOSIS — F17219 Nicotine dependence, cigarettes, with unspecified nicotine-induced disorders: Secondary | ICD-10-CM

## 2021-09-21 DIAGNOSIS — M6281 Muscle weakness (generalized): Secondary | ICD-10-CM | POA: Diagnosis not present

## 2021-09-21 DIAGNOSIS — G8929 Other chronic pain: Secondary | ICD-10-CM | POA: Diagnosis not present

## 2021-09-21 DIAGNOSIS — M5441 Lumbago with sciatica, right side: Secondary | ICD-10-CM | POA: Diagnosis not present

## 2021-09-23 DIAGNOSIS — Z6824 Body mass index (BMI) 24.0-24.9, adult: Secondary | ICD-10-CM | POA: Diagnosis not present

## 2021-09-24 DIAGNOSIS — G8929 Other chronic pain: Secondary | ICD-10-CM | POA: Diagnosis not present

## 2021-09-24 DIAGNOSIS — M6281 Muscle weakness (generalized): Secondary | ICD-10-CM | POA: Diagnosis not present

## 2021-09-24 DIAGNOSIS — M5441 Lumbago with sciatica, right side: Secondary | ICD-10-CM | POA: Diagnosis not present

## 2021-09-30 ENCOUNTER — Ambulatory Visit: Payer: Medicare Other | Admitting: Family Medicine

## 2021-10-05 ENCOUNTER — Other Ambulatory Visit: Payer: Self-pay | Admitting: Family Medicine

## 2021-10-19 ENCOUNTER — Ambulatory Visit (INDEPENDENT_AMBULATORY_CARE_PROVIDER_SITE_OTHER): Payer: Medicare Other | Admitting: Family Medicine

## 2021-10-19 VITALS — BP 110/60 | HR 76 | Temp 96.4°F | Resp 16 | Ht 69.0 in | Wt 161.6 lb

## 2021-10-19 DIAGNOSIS — L568 Other specified acute skin changes due to ultraviolet radiation: Secondary | ICD-10-CM | POA: Insufficient documentation

## 2021-10-19 DIAGNOSIS — G4733 Obstructive sleep apnea (adult) (pediatric): Secondary | ICD-10-CM

## 2021-10-19 NOTE — Progress Notes (Signed)
Subjective:  Patient ID: Jacob Graves, male    DOB: [birth year]  Age: 66 y.o. MRN: [id number]  Chief Complaint  Patient presents with   Sleep Apnea    [name] comes in to discuss his past history of sleep apnea.  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    Patient has lost weight. He has not had another sleep study to determine if he still needs cpap.   Patient seems to have an increased photosensitivity over the last year. Questioning whether it might be one of his medicines.  Current Outpatient Medications on File Prior to Visit  Medication Sig Dispense Refill   Aspirin-Acetaminophen-Caffeine 500-325-65 MG PACK Take 325 mg by mouth 2 (two) times daily as needed.     atorvastatin (LIPITOR) 80 MG tablet TAKE 1 TABLET BY MOUTH  DAILY 90 tablet 3   Buprenorphine HCl-Naloxone HCl 8-2 MG FILM Place under the tongue.     fexofenadine (ALLEGRA) 180 MG tablet Take 1 tablet (180 mg total) by mouth daily. 90 tablet 3   fluticasone (FLONASE) 50 MCG/ACT nasal spray INHALE 1 SPRAY IN EACH NOSTRIL EVERY DAY 16 g 2   gabapentin (NEURONTIN) 600 MG tablet TAKE ONE TABLET BY MOUTH EVERY MORNING AT NOON  IN THE EVENING  AND AT BEDTIME 360 tablet 3   ibuprofen (ADVIL) 800 MG tablet Take 1 tablet (800 mg total) by mouth 3 (three) times daily. 270 tablet 1   meclizine (ANTIVERT) 25 MG tablet TAKE ONE TABLET BY MOUTH 3 TIMES A DAY AS NEEDED FOR DIZZINESS 30 tablet 1   montelukast (SINGULAIR) 10 MG tablet TAKE 1 TABLET(10 MG) BY MOUTH AT BEDTIME 90 tablet 3   omeprazole (PRILOSEC) 40 MG capsule TAKE 1 CAPSULE BY MOUTH  DAILY BEFORE A MEAL 100 capsule 2   ondansetron (ZOFRAN) 4 MG tablet TAKE ONE TABLET BY MOUTH EVERY 8 HOURS AS NEEDED FOR NAUSEA/VOMITING 60 tablet 1   polyethylene glycol powder (GLYCOLAX/MIRALAX) 17 GM/SCOOP powder Take 17 g by mouth daily.     PROAIR HFA 108 (90 Base) MCG/ACT inhaler INHALE 2 PUFFS BY MOUTH EVERY 6 HOURS  ASNEEDED FOR SHORT OF BREATH OR WHEEZING 8.5 g 1   propranolol (INDERAL) 60 MG tablet TAKE 1 TABLET BY MOUTH  TWICE DAILY 180 tablet 3   SPIRIVA RESPIMAT 2.5 MCG/ACT AERS INHALE 2 PUFFS BY MOUTH EVERY DAY 4 g 2   tamsulosin (FLOMAX) 0.4 MG CAPS capsule TAKE 2 CAPSULES BY MOUTH  DAILY AFTER SUPPER 180 capsule 3   varenicline (CHANTIX) 1 MG tablet TAKE ONE TABLET BY MOUTH 2 TIMES A DAY 60 tablet 3   No current facility-administered medications on file prior to visit.   Past Medical History:  Diagnosis Date   Apnea    Arthritis    Arthritis    spinal arthritis   Cancer ([place])    melanoma, basal cell   Centrilobular emphysema ([place]) 07/08/2015   Depression    Emphysema of lung ([facility])    COPD   GERD (gastroesophageal reflux disease)    Hypercholesterolemia    Hypertension    Impaired fasting glucose    Kyphosis    mild   Migraine    Neuropathy    Osteoarthritis    Other testicular dysfunction    Scoliosis    Sleep apnea    Solitary pulmonary nodule    Tinnitus    Past Surgical  History:  Procedure Laterality Date   COLONOSCOPY  2016   Colon polyps. Tubular Adenoma.   TONSILLECTOMY      Family History  Problem Relation Age of Onset   Heart disease Father    Stroke Father    Breast cancer Maternal Grandmother    Leukemia Mother    Skin cancer Mother    Diabetes Mother    Stroke Mother    Hypertension Mother    Skin cancer Brother    Cancer Brother        Melanoma   Hypertension Brother    Colon cancer Neg Hx    Social History   Socioeconomic History   Marital status: Married    Spouse name: Jacob Graves   Number of children: 2   Years of education: 12   Highest education level: Not on file  Occupational History   Occupation: disabled  Tobacco Use   Smoking status: Every Day    Packs/day: 0.50    Types: Cigarettes    Start date: 1974   Smokeless tobacco: Never   Tobacco comments:    currently smoking 3/4 ppd. For years smoked 1 1/2 ppd   Vaping Use   Vaping Use:  Never used  Substance and Sexual Activity   Alcohol use: No    Alcohol/week: 0.0 standard drinks of alcohol   Drug use: No   Sexual activity: Not on file  Other Topics Concern   Not on file  Social History Narrative   Lives with wife   Caffeine- coffee, 3 cups daily   Social Determinants of Health   Financial Resource Strain: Not on file  Food Insecurity: No Food Insecurity (07/24/2019)   Hunger Vital Sign    Worried About Running Out of Food in the Last Year: Never true    Ran Out of Food in the Last Year: Never true  Transportation Needs: No Transportation Needs (07/24/2019)   PRAPARE - Hydrologist (Medical): No    Lack of Transportation (Non-Medical): No  Physical Activity: Not on file  Stress: Not on file  Social Connections: Not on file    Review of Systems  Constitutional:  Negative for chills and fever.  HENT:  Negative for congestion, rhinorrhea and sore throat.   Respiratory:  Negative for cough and shortness of breath.   Cardiovascular:  Negative for chest pain and palpitations.  Gastrointestinal:  Positive for constipation. Negative for abdominal pain, diarrhea, nausea and vomiting.  Genitourinary:  Negative for dysuria and urgency.  Musculoskeletal:  Negative for arthralgias, back pain and myalgias.  Neurological:  Negative for dizziness and headaches.  Psychiatric/Behavioral:  Negative for dysphoric mood. The patient is not nervous/anxious.      Objective:  BP 110/60   Pulse 76   Temp (!) 96.4 F (35.8 C)   Resp 16   Ht '5\' 9"'$  (1.753 m)   Wt 161 lb 9.6 oz (73.3 kg)   BMI 23.86 kg/m      10/19/2021    9:48 AM 08/19/2021   10:56 AM 01/27/2021    7:37 AM  BP/Weight  Systolic BP 751 025 852  Diastolic BP 60 70 70  Wt. (Lbs) 161.6 162 169  BMI 23.86 kg/m2 23.24 kg/m2 24.96 kg/m2    Physical Exam Vitals reviewed.  Constitutional:      Appearance: Normal appearance.  Neck:     Vascular: No carotid bruit.   Cardiovascular:     Rate and Rhythm: Normal rate and regular rhythm.  Pulses: Normal pulses.     Heart sounds: Normal heart sounds.  Pulmonary:     Effort: Pulmonary effort is normal.     Breath sounds: Normal breath sounds. No wheezing, rhonchi or rales.  Abdominal:     General: Bowel sounds are normal.     Palpations: Abdomen is soft.     Tenderness: There is no abdominal tenderness.  Neurological:     Mental Status: He is alert.  Psychiatric:        Mood and Affect: Mood normal.        Behavior: Behavior normal.     Diabetic Foot Exam - Simple   No data filed      Lab Results  Component Value Date   WBC 7.1 01/27/2021   HGB 13.6 01/27/2021   HCT 40.0 01/27/2021   PLT 209 01/27/2021   GLUCOSE 104 (H) 01/27/2021   CHOL 130 01/27/2021   TRIG 77 01/27/2021   HDL 45 01/27/2021   LDLCALC 70 01/27/2021   ALT 13 01/27/2021   AST 7 01/27/2021   NA 142 01/27/2021   K 4.4 01/27/2021   CL 103 01/27/2021   CREATININE 0.84 01/27/2021   BUN 13 01/27/2021   CO2 26 01/27/2021   TSH 1.780 10/20/2020   INR 1.0 01/27/2021      Assessment & Plan:   Problem List Items Addressed This Visit       Respiratory   OSA (obstructive sleep apnea) - Primary    Home sleep study ordered.  I will fill out cdl following results reviewed.       Relevant Orders   Home sleep test     Other   Photosensitivity    I will ask [facility] pharmacist to do a medication review and recommendations.        Orders Placed This Encounter  Procedures   Home sleep test     Follow-up: Return in about 15 weeks (around 02/01/2022) for chronic fasting, awv with Shelle Iron.  An After Visit Summary was printed and given to the patient.  Rochel Brome, MD [facility] [phone]

## 2021-10-21 DIAGNOSIS — Z79899 Other long term (current) drug therapy: Secondary | ICD-10-CM | POA: Diagnosis not present

## 2021-10-21 DIAGNOSIS — Z6824 Body mass index (BMI) 24.0-24.9, adult: Secondary | ICD-10-CM | POA: Diagnosis not present

## 2021-10-25 ENCOUNTER — Encounter: Payer: Self-pay | Admitting: Family Medicine

## 2021-10-25 NOTE — Assessment & Plan Note (Signed)
Home sleep study ordered.  I will fill out cdl following results reviewed.

## 2021-10-25 NOTE — Assessment & Plan Note (Signed)
I will ask [facility] pharmacist to do a medication review and recommendations.

## 2021-10-26 DIAGNOSIS — G4733 Obstructive sleep apnea (adult) (pediatric): Secondary | ICD-10-CM | POA: Diagnosis not present

## 2021-10-26 DIAGNOSIS — R0602 Shortness of breath: Secondary | ICD-10-CM | POA: Diagnosis not present

## 2021-10-27 DIAGNOSIS — G4733 Obstructive sleep apnea (adult) (pediatric): Secondary | ICD-10-CM | POA: Diagnosis not present

## 2021-10-27 DIAGNOSIS — R0602 Shortness of breath: Secondary | ICD-10-CM | POA: Diagnosis not present

## 2021-10-29 ENCOUNTER — Telehealth: Payer: Self-pay

## 2021-10-29 NOTE — Telephone Encounter (Signed)
Spoke with wife and patient. They VU.  Royce Macadamia, [place] 10/29/21 11:24 AM

## 2021-10-29 NOTE — Telephone Encounter (Signed)
-----   Message from Rochel Brome, MD sent at 10/28/2021 11:22 PM EDT ----- Regarding: Medications causing photosensitivity. Please call patient and let him know that chantix and gabapentin can cause photosensitivity. Recommend stop chantix. I would recommend using nicoderm patches instead. Dr. Tobie Poet  ----- Message ----- From: Lane Hacker, [facility] Sent: 10/27/2021  10:28 AM EDT To: Rochel Brome, MD  varenicline and Gabapentin could cause photosensitivity

## 2021-11-03 ENCOUNTER — Other Ambulatory Visit: Payer: Self-pay

## 2021-11-03 ENCOUNTER — Telehealth: Payer: Self-pay

## 2021-11-03 DIAGNOSIS — G4733 Obstructive sleep apnea (adult) (pediatric): Secondary | ICD-10-CM

## 2021-11-03 NOTE — Telephone Encounter (Signed)
Patient is wanting to know if he can get a letter from provider stating that he does not need a CPAP Machine, since his results was mild and a CPAP was reccommended but not needed. 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

## 2021-11-04 ENCOUNTER — Other Ambulatory Visit: Payer: Self-pay | Admitting: Family Medicine

## 2021-11-04 NOTE — Telephone Encounter (Signed)
Patient made aware to come pick up letter in the morning.

## 2021-11-04 NOTE — Telephone Encounter (Signed)
Jacob Graves called this morning to follow up on this letter. Jacob Graves would like for someone to call her back.

## 2021-11-12 ENCOUNTER — Ambulatory Visit: Payer: Medicare Other

## 2021-11-16 ENCOUNTER — Other Ambulatory Visit: Payer: Self-pay | Admitting: Family Medicine

## 2021-11-18 DIAGNOSIS — Z6824 Body mass index (BMI) 24.0-24.9, adult: Secondary | ICD-10-CM | POA: Diagnosis not present

## 2021-12-01 ENCOUNTER — Other Ambulatory Visit: Payer: Self-pay | Admitting: Family Medicine

## 2021-12-01 DIAGNOSIS — J449 Chronic obstructive pulmonary disease, unspecified: Secondary | ICD-10-CM

## 2021-12-14 ENCOUNTER — Other Ambulatory Visit: Payer: Self-pay | Admitting: Family Medicine

## 2021-12-15 ENCOUNTER — Other Ambulatory Visit: Payer: Self-pay | Admitting: Family Medicine

## 2021-12-23 DIAGNOSIS — Z79899 Other long term (current) drug therapy: Secondary | ICD-10-CM | POA: Diagnosis not present

## 2021-12-23 DIAGNOSIS — Z6824 Body mass index (BMI) 24.0-24.9, adult: Secondary | ICD-10-CM | POA: Diagnosis not present

## 2022-01-18 ENCOUNTER — Other Ambulatory Visit: Payer: Self-pay | Admitting: Family Medicine

## 2022-01-30 ENCOUNTER — Other Ambulatory Visit: Payer: Self-pay | Admitting: Family Medicine

## 2022-02-03 ENCOUNTER — Ambulatory Visit: Payer: Medicare Other | Admitting: Family Medicine

## 2022-02-04 ENCOUNTER — Other Ambulatory Visit: Payer: Medicare Other

## 2022-02-04 DIAGNOSIS — E782 Mixed hyperlipidemia: Secondary | ICD-10-CM | POA: Diagnosis not present

## 2022-02-04 DIAGNOSIS — I10 Essential (primary) hypertension: Secondary | ICD-10-CM

## 2022-02-04 DIAGNOSIS — R7303 Prediabetes: Secondary | ICD-10-CM | POA: Diagnosis not present

## 2022-02-04 DIAGNOSIS — Z1159 Encounter for screening for other viral diseases: Secondary | ICD-10-CM | POA: Diagnosis not present

## 2022-02-05 LAB — COMPREHENSIVE METABOLIC PANEL
ALT: 13 IU/L (ref 0–44)
AST: 9 IU/L (ref 0–40)
Albumin/Globulin Ratio: 1.9 (ref 1.2–2.2)
Albumin: 4.3 g/dL (ref 3.9–4.9)
Alkaline Phosphatase: 131 IU/L — ABNORMAL HIGH (ref 44–121)
BUN/Creatinine Ratio: 16 (ref 10–24)
BUN: 14 mg/dL (ref 8–27)
Bilirubin Total: 0.4 mg/dL (ref 0.0–1.2)
CO2: 30 mmol/L — ABNORMAL HIGH (ref 20–29)
Calcium: 9 mg/dL (ref 8.6–10.2)
Chloride: 99 mmol/L (ref 96–106)
Creatinine, Ser: 0.87 mg/dL (ref 0.76–1.27)
Globulin, Total: 2.3 g/dL (ref 1.5–4.5)
Glucose: 91 mg/dL (ref 70–99)
Potassium: 4.3 mmol/L (ref 3.5–5.2)
Sodium: 140 mmol/L (ref 134–144)
Total Protein: 6.6 g/dL (ref 6.0–8.5)
eGFR: 96 mL/min/{1.73_m2} (ref >59)

## 2022-02-05 LAB — CBC
Hematocrit: 40 % (ref 37.5–51.0)
Hemoglobin: 13.7 g/dL (ref 13.0–17.7)
MCH: 30 pg (ref 26.6–33.0)
MCHC: 34.3 g/dL (ref 31.5–35.7)
MCV: 88 fL (ref 79–97)
Platelets: 214 10*3/uL (ref 150–450)
RBC: 4.57 x10E6/uL (ref 4.14–5.80)
RDW: 12.1 % (ref 11.6–15.4)
WBC: 7.6 10*3/uL (ref 3.4–10.8)

## 2022-02-05 LAB — LIPID PANEL
Chol/HDL Ratio: 2.5 ratio (ref 0.0–5.0)
Cholesterol, Total: 142 mg/dL (ref 100–199)
HDL: 56 mg/dL (ref >39)
LDL Chol Calc (NIH): 72 mg/dL (ref 0–99)
Triglycerides: 68 mg/dL (ref 0–149)
VLDL Cholesterol Cal: 14 mg/dL (ref 5–40)

## 2022-02-05 LAB — CARDIOVASCULAR RISK ASSESSMENT

## 2022-02-05 LAB — TSH: TSH: 2.55 u[IU]/mL (ref 0.450–4.500)

## 2022-02-08 ENCOUNTER — Encounter: Payer: Self-pay | Admitting: Family Medicine

## 2022-02-08 ENCOUNTER — Ambulatory Visit (INDEPENDENT_AMBULATORY_CARE_PROVIDER_SITE_OTHER): Payer: Medicare Other | Admitting: Family Medicine

## 2022-02-08 VITALS — BP 106/72 | HR 78 | Temp 96.2°F | Resp 16 | Ht 70.0 in | Wt 172.6 lb

## 2022-02-08 DIAGNOSIS — R7303 Prediabetes: Secondary | ICD-10-CM | POA: Diagnosis not present

## 2022-02-08 DIAGNOSIS — I10 Essential (primary) hypertension: Secondary | ICD-10-CM | POA: Diagnosis not present

## 2022-02-08 DIAGNOSIS — Z1159 Encounter for screening for other viral diseases: Secondary | ICD-10-CM | POA: Insufficient documentation

## 2022-02-08 DIAGNOSIS — Z114 Encounter for screening for human immunodeficiency virus [HIV]: Secondary | ICD-10-CM | POA: Diagnosis not present

## 2022-02-08 DIAGNOSIS — F17219 Nicotine dependence, cigarettes, with unspecified nicotine-induced disorders: Secondary | ICD-10-CM | POA: Diagnosis not present

## 2022-02-08 DIAGNOSIS — K219 Gastro-esophageal reflux disease without esophagitis: Secondary | ICD-10-CM | POA: Diagnosis not present

## 2022-02-08 DIAGNOSIS — G894 Chronic pain syndrome: Secondary | ICD-10-CM

## 2022-02-08 DIAGNOSIS — E782 Mixed hyperlipidemia: Secondary | ICD-10-CM

## 2022-02-08 DIAGNOSIS — J432 Centrilobular emphysema: Secondary | ICD-10-CM

## 2022-02-08 DIAGNOSIS — F1120 Opioid dependence, uncomplicated: Secondary | ICD-10-CM

## 2022-02-08 DIAGNOSIS — G4733 Obstructive sleep apnea (adult) (pediatric): Secondary | ICD-10-CM

## 2022-02-08 MED ORDER — TRAZODONE HCL 50 MG PO TABS: 25.0000 mg | ORAL_TABLET | Freq: Every evening | ORAL | 3 refills | Status: DC | PRN

## 2022-02-08 NOTE — Progress Notes (Signed)
+  Subjective:  Patient ID: Jacob Graves, male    DOB: [birth year]  Age: 66 y.o. MRN: [id number]  Chief Complaint  Patient presents with   Hypertension    HPI Insomnia: waking up every 2 hours at night several months.  OSA: not wearing CPAP. Does not tolerate it.  COPD: Spiriva, proair. Breathing is good.  Hyperlipidemia: on atorvastatin 80 mg before bed.  Allergies: allegra, flonase.  Tobacco Use: Dropped from 1 ppd to 1/2 ppd on chantix 1 mg twice daily.  Hypertension: propranolol 60 mg once daily.  GERD: on omeprazole 40 mg once daily.  Chronic back pain: Sees pain management.   Constipation: Miralax once daily helps.  Current Outpatient Medications on File Prior to Visit  Medication Sig Dispense Refill   Aspirin-Acetaminophen-Caffeine 500-325-65 MG PACK Take 325 mg by mouth 2 (two) times daily as needed.     atorvastatin (LIPITOR) 80 MG tablet TAKE 1 TABLET BY MOUTH  DAILY 90 tablet 3   Buprenorphine HCl-Naloxone HCl 8-2 MG FILM Place under the tongue.     fexofenadine (ALLEGRA) 180 MG tablet Take 1 tablet (180 mg total) by mouth daily. 90 tablet 3   fluticasone (FLONASE) 50 MCG/ACT nasal spray INHALE 1 SPRAY IN EACH NOSTRIL EVERY DAY 16 g 2   gabapentin (NEURONTIN) 600 MG tablet TAKE ONE TABLET BY MOUTH EVERY MORNING AT NOON  IN THE EVENING  AND AT BEDTIME 360 tablet 3   ibuprofen (ADVIL) 800 MG tablet TAKE ONE TABLET BY MOUTH 3 TIMES A DAY 270 tablet 1   meclizine (ANTIVERT) 25 MG tablet TAKE ONE TABLET BY MOUTH 3 TIMES A DAY AS NEEDED FOR DIZZINESS 30 tablet 1   montelukast (SINGULAIR) 10 MG tablet TAKE ONE TABLET BY MOUTH EVERY DAY 90 tablet 2   omeprazole (PRILOSEC) 40 MG capsule TAKE 1 CAPSULE BY MOUTH  DAILY BEFORE A MEAL 100 capsule 2   ondansetron (ZOFRAN) 4 MG tablet TAKE ONE TABLET BY MOUTH EVERY 8 HOURS AS NEEDED FOR NAUSEA/VOMITING 60 tablet 1   polyethylene glycol powder (GLYCOLAX/MIRALAX) 17 GM/SCOOP powder MIX 17 GRAMS (1 CAPFUL) AS DIRECTED AND TAKE 2 TIMES A DAY  510 g 2   PROAIR HFA 108 (90 Base) MCG/ACT inhaler INHALE 2 PUFFS BY MOUTH EVERY 6 HOURS ASNEEDED FOR SHORT OF BREATH OR WHEEZING 8.5 g 1   propranolol (INDERAL) 60 MG tablet TAKE 1 TABLET BY MOUTH  TWICE DAILY 180 tablet 3   SPIRIVA RESPIMAT 2.5 MCG/ACT AERS INHALE 2 PUFFS BY MOUTH EVERY DAY 4 g 2   tamsulosin (FLOMAX) 0.4 MG CAPS capsule TAKE 2 CAPSULES BY MOUTH  DAILY AFTER SUPPER 180 capsule 3   varenicline (CHANTIX) 1 MG tablet TAKE ONE TABLET BY MOUTH 2 TIMES A DAY 60 tablet 3   No current facility-administered medications on file prior to visit.   Past Medical History:  Diagnosis Date   Apnea    Arthritis    Arthritis    spinal arthritis   Cancer ([place])    melanoma, basal cell   Centrilobular emphysema ([place]) 07/08/2015   Depression    Emphysema of lung ([facility])    COPD   GERD (gastroesophageal reflux disease)    Hypercholesterolemia    Hypertension    Impaired fasting glucose    Kyphosis    mild   Migraine    Neuropathy    Osteoarthritis    Other testicular dysfunction    Scoliosis    Sleep apnea    Solitary pulmonary nodule  Tinnitus    Past Surgical History:  Procedure Laterality Date   COLONOSCOPY  2016   Colon polyps. Tubular Adenoma.   TONSILLECTOMY      Family History  Problem Relation Age of Onset   Heart disease Father    Stroke Father    Breast cancer Maternal Grandmother    Leukemia Mother    Skin cancer Mother    Diabetes Mother    Stroke Mother    Hypertension Mother    Skin cancer Brother    Cancer Brother        Melanoma   Hypertension Brother    Colon cancer Neg Hx    Social History   Socioeconomic History   Marital status: Married    Spouse name: Baker Janus   Number of children: 2   Years of education: 12   Highest education level: Not on file  Occupational History   Occupation: disabled  Tobacco Use   Smoking status: Every Day    Packs/day: 0.50    Types: Cigarettes    Start date: 1974   Smokeless tobacco: Never   Tobacco  comments:    currently smoking 3/4 ppd. For years smoked 1 1/2 ppd   Vaping Use   Vaping Use: Never used  Substance and Sexual Activity   Alcohol use: No    Alcohol/week: 0.0 standard drinks of alcohol   Drug use: No   Sexual activity: Not on file  Other Topics Concern   Not on file  Social History Narrative   Lives with wife   Caffeine- coffee, 3 cups daily   Social Determinants of Health   Financial Resource Strain: Low Risk  (02/08/2022)   Overall Financial Resource Strain (CARDIA)    Difficulty of Paying Living Expenses: Not hard at all  Food Insecurity: No Food Insecurity (02/08/2022)   Hunger Vital Sign    Worried About Running Out of Food in the Last Year: Never true    Ran Out of Food in the Last Year: Never true  Transportation Needs: No Transportation Needs (07/24/2019)   PRAPARE - Hydrologist (Medical): No    Lack of Transportation (Non-Medical): No  Physical Activity: Inactive (02/08/2022)   Exercise Vital Sign    Days of Exercise per Week: 0 days    Minutes of Exercise per Session: 0 min  Stress: No Stress Concern Present (02/08/2022)   [place]    Feeling of Stress : Not at all  Social Connections: [place] (02/08/2022)   Social Connection and Isolation Panel [NHANES]    Frequency of Communication with Friends and Family: More than three times a week    Frequency of Social Gatherings with Friends and Family: Once a week    Attends Religious Services: More than 4 times per year    Active Member of [employer] or Organizations: Yes    Attends Music therapist: More than 4 times per year    Marital Status: Married    Review of Systems  Constitutional:  Negative for chills and fever.  HENT:  Negative for congestion, rhinorrhea and sore throat.   Respiratory:  Positive for shortness of breath. Negative for cough.   Cardiovascular:  Negative for  chest pain and palpitations.  Gastrointestinal:  Negative for abdominal pain, constipation, diarrhea, nausea and vomiting.  Genitourinary:  Negative for dysuria and urgency.  Musculoskeletal:  Positive for back pain. Negative for arthralgias and myalgias.  Neurological:  Negative for dizziness and headaches.  Psychiatric/Behavioral:  Negative for dysphoric mood. The patient is not nervous/anxious.      Objective:  BP 106/72   Pulse 78   Temp (!) 96.2 F (35.7 C)   Resp 16   Ht '5\' 10"'$  (1.778 m)   Wt 172 lb 9.6 oz (78.3 kg)   BMI 24.77 kg/m      02/08/2022    1:30 PM 10/19/2021    9:48 AM 08/19/2021   10:56 AM  BP/Weight  Systolic BP 599 357 017  Diastolic BP 72 60 70  Wt. (Lbs) 172.6 161.6 162  BMI 24.77 kg/m2 23.86 kg/m2 23.24 kg/m2    Physical Exam Vitals reviewed.  Constitutional:      Appearance: Normal appearance.  Neck:     Vascular: No carotid bruit.  Cardiovascular:     Rate and Rhythm: Normal rate and regular rhythm.     Pulses: Normal pulses.     Heart sounds: Normal heart sounds.  Pulmonary:     Effort: Pulmonary effort is normal.     Breath sounds: Normal breath sounds. No wheezing, rhonchi or rales.  Abdominal:     General: Bowel sounds are normal.     Palpations: Abdomen is soft.     Tenderness: There is no abdominal tenderness.  Neurological:     Mental Status: He is alert.  Psychiatric:        Mood and Affect: Mood normal.        Behavior: Behavior normal.     Diabetic Foot Exam - Simple   No data filed      Lab Results  Component Value Date   WBC 7.6 02/04/2022   HGB 13.7 02/04/2022   HCT 40.0 02/04/2022   PLT 214 02/04/2022   GLUCOSE 91 02/04/2022   CHOL 142 02/04/2022   TRIG 68 02/04/2022   HDL 56 02/04/2022   LDLCALC 72 02/04/2022   ALT 13 02/04/2022   AST 9 02/04/2022   NA 140 02/04/2022   K 4.3 02/04/2022   CL 99 02/04/2022   CREATININE 0.87 02/04/2022   BUN 14 02/04/2022   CO2 30 (H) 02/04/2022   TSH 2.550 02/04/2022    INR 1.0 01/27/2021   HGBA1C 5.9 (H) 02/04/2022      Assessment & Plan:   Problem List Items Addressed This Visit       Cardiovascular and Mediastinum   Essential hypertension, benign    Well controlled.  No changes to medicines. Continue  propranolol 60 mg once daily.  Continue to work on eating a healthy diet and exercise.  Labs drawn today.          Respiratory   Centrilobular emphysema ([place])    The current medical regimen is effective;  continue present plan and medications. Continue Spiriva, proair.       OSA (obstructive sleep apnea)    Intolerant to cpap.         Digestive   Gastroesophageal reflux disease    The current medical regimen is effective;  continue present plan and medications.         Nervous and Auditory   Cigarette nicotine dependence with nicotine-induced disorder    Encouraged to quit.  Continue chantix.         Other   Chronic pain syndrome    Management per specialist.        Relevant Medications   traZODone (DESYREL) 50 MG tablet   Uncomplicated opioid dependence ([place])  Managed by pain clinic.      Mixed hyperlipidemia    Well controlled.  No changes to medicines.  Continue to work on eating a healthy diet and exercise.  Labs drawn today.        Prediabetes - Primary    Recommend continue to work on eating healthy diet and exercise.       Relevant Orders   Hemoglobin A1c   Need for hepatitis C screening test   Relevant Orders   HCV Ab w Reflex to Quant PCR   Encounter for screening for HIV  .  Meds ordered this encounter  Medications   traZODone (DESYREL) 50 MG tablet    Sig: Take 0.5-1 tablets (25-50 mg total) by mouth at bedtime as needed for sleep.    Dispense:  30 tablet    Refill:  3    Orders Placed This Encounter  Procedures   Hemoglobin A1c   HCV Ab w Reflex to Quant PCR     Follow-up: Return in about 6 months (around 08/10/2022) for chronic fasting.  An After Visit Summary was  printed and given to the patient.  Rochel Brome, MD [facility] [phone]

## 2022-02-08 NOTE — Patient Instructions (Addendum)
  Mr. Jacob Graves , Thank you for taking time to come for your Medicare Wellness Visit. I appreciate your ongoing commitment to your health goals. Please review the following plan we discussed and let me know if I can assist you in the future.   These are the goals we discussed: Quit smoking.  Recommend on nicotine patch 14 mcg once daily.  Continue chantix.  Recommend shingrix vaccine series at the pharmacy. Make a dentist appointment.  Trial on trazodone 50 mg once daily at night for insomnia.   Things to do to keep yourself healthy  - Exercise at least 15 minutes a day, 3-4 days a week.  - Eat a low-fat diet with lots of fruits and vegetables, up to 7-9 servings per day.  - Seatbelts can save your life. Wear them always.  - Smoke detectors on every level of your home, check batteries every year.  - Eye Doctor - have an eye exam every 1-2 years  - [name]. Choose someone to speak for you if you are not able. Please bring a copy back to [place].  - Depression is common in our stressful world.If you're feeling down or losing interest in things you normally enjoy, please come in for a visit.  This is a list of the screening recommended for you and due dates:   Health Maintenance  Topic Date Due   Hepatitis C Screening: USPSTF Recommendation to screen - Ages 73-79 yo.  Never done   Zoster (Shingles) Vaccine (1 of 2) Never done   Medicare Annual Wellness Visit  11/09/2019   DTaP/Tdap/Td vaccine (2 - Td or Tdap) 10/14/2024   Colon Cancer Screening  06/03/2031   HIV Screening  Completed   HPV Vaccine  Aged Out   Flu Shot  Discontinued   COVID-19 Vaccine  Discontinued    Have a Merry Christmas!  Dr. Tobie Poet

## 2022-02-11 LAB — HEMOGLOBIN A1C
Est. average glucose Bld gHb Est-mCnc: 123 mg/dL
Hgb A1c MFr Bld: 5.9 % — ABNORMAL HIGH (ref 4.8–5.6)

## 2022-02-11 LAB — SPECIMEN STATUS REPORT

## 2022-02-11 LAB — HEPATITIS C ANTIBODY: Hep C Virus Ab: NONREACTIVE

## 2022-02-11 NOTE — Progress Notes (Signed)
Blood count normal.  Liver function normal.  Kidney function normal.  Thyroid function normal.  Cholesterol: good HBA1C: 5.9. prediabetes. Low sugar diet.  Hepatitis C negative.

## 2022-02-14 NOTE — Assessment & Plan Note (Signed)
Recommend continue to work on eating healthy diet and exercise.  

## 2022-02-14 NOTE — Assessment & Plan Note (Addendum)
The current medical regimen is effective;  continue present plan and medications. Continue Spiriva, proair.

## 2022-02-14 NOTE — Assessment & Plan Note (Signed)
Well controlled.  ?No changes to medicines.  ?Continue to work on eating a healthy diet and exercise.  ?Labs drawn today.  ?

## 2022-02-14 NOTE — Assessment & Plan Note (Signed)
Encouraged to quit.  Continue chantix.

## 2022-02-14 NOTE — Assessment & Plan Note (Signed)
Intolerant to cpap.  

## 2022-02-14 NOTE — Assessment & Plan Note (Signed)
Management per specialist. 

## 2022-02-14 NOTE — Assessment & Plan Note (Signed)
Managed by pain clinic 

## 2022-02-14 NOTE — Assessment & Plan Note (Signed)
Well controlled.  No changes to medicines. Continue  propranolol 60 mg once daily.  Continue to work on eating a healthy diet and exercise.  Labs drawn today.

## 2022-02-14 NOTE — Assessment & Plan Note (Signed)
The current medical regimen is effective;  continue present plan and medications.  

## 2022-02-15 ENCOUNTER — Other Ambulatory Visit: Payer: Self-pay | Admitting: Family Medicine

## 2022-02-23 ENCOUNTER — Other Ambulatory Visit: Payer: Self-pay | Admitting: Family Medicine

## 2022-02-23 DIAGNOSIS — J301 Allergic rhinitis due to pollen: Secondary | ICD-10-CM

## 2022-02-23 DIAGNOSIS — J0100 Acute maxillary sinusitis, unspecified: Secondary | ICD-10-CM | POA: Diagnosis not present

## 2022-02-23 DIAGNOSIS — J449 Chronic obstructive pulmonary disease, unspecified: Secondary | ICD-10-CM | POA: Diagnosis not present

## 2022-03-10 DIAGNOSIS — Z79899 Other long term (current) drug therapy: Secondary | ICD-10-CM | POA: Diagnosis not present

## 2022-03-23 ENCOUNTER — Other Ambulatory Visit: Payer: Self-pay | Admitting: Family Medicine

## 2022-03-26 ENCOUNTER — Other Ambulatory Visit: Payer: Self-pay | Admitting: Family Medicine

## 2022-03-26 DIAGNOSIS — J449 Chronic obstructive pulmonary disease, unspecified: Secondary | ICD-10-CM

## 2022-04-05 ENCOUNTER — Other Ambulatory Visit: Payer: Self-pay | Admitting: Family Medicine

## 2022-04-21 DIAGNOSIS — Z79899 Other long term (current) drug therapy: Secondary | ICD-10-CM | POA: Diagnosis not present

## 2022-04-26 ENCOUNTER — Other Ambulatory Visit: Payer: Self-pay

## 2022-04-26 ENCOUNTER — Other Ambulatory Visit: Payer: Self-pay | Admitting: Family Medicine

## 2022-04-26 MED ORDER — ALBUTEROL SULFATE HFA 108 (90 BASE) MCG/ACT IN AERS: INHALATION_SPRAY | RESPIRATORY_TRACT | 1 refills | Status: DC

## 2022-05-04 ENCOUNTER — Other Ambulatory Visit: Payer: Self-pay

## 2022-05-24 ENCOUNTER — Other Ambulatory Visit: Payer: Self-pay | Admitting: Family Medicine

## 2022-05-24 DIAGNOSIS — F17219 Nicotine dependence, cigarettes, with unspecified nicotine-induced disorders: Secondary | ICD-10-CM

## 2022-05-31 ENCOUNTER — Other Ambulatory Visit: Payer: Self-pay | Admitting: Family Medicine

## 2022-06-11 ENCOUNTER — Other Ambulatory Visit: Payer: Self-pay | Admitting: Family Medicine

## 2022-06-18 DIAGNOSIS — Z79899 Other long term (current) drug therapy: Secondary | ICD-10-CM | POA: Diagnosis not present

## 2022-06-29 ENCOUNTER — Other Ambulatory Visit: Payer: Self-pay | Admitting: Family Medicine

## 2022-07-09 ENCOUNTER — Other Ambulatory Visit: Payer: Self-pay | Admitting: Family Medicine

## 2022-07-09 DIAGNOSIS — J449 Chronic obstructive pulmonary disease, unspecified: Secondary | ICD-10-CM

## 2022-07-28 ENCOUNTER — Other Ambulatory Visit: Payer: Self-pay | Admitting: Family Medicine

## 2022-08-06 DIAGNOSIS — Z79899 Other long term (current) drug therapy: Secondary | ICD-10-CM | POA: Diagnosis not present

## 2022-08-10 ENCOUNTER — Ambulatory Visit: Payer: Medicare Other | Admitting: Family Medicine

## 2022-08-19 ENCOUNTER — Other Ambulatory Visit: Payer: Self-pay

## 2022-08-19 MED ORDER — OMEPRAZOLE 40 MG PO CPDR: DELAYED_RELEASE_CAPSULE | ORAL | 2 refills | Status: DC

## 2022-08-30 NOTE — Assessment & Plan Note (Signed)
Well controlled.  No changes to medicines. Atorvastatin 80 mg daily. Continue to work on eating a healthy diet and exercise.  Labs drawn today.

## 2022-08-30 NOTE — Progress Notes (Unsigned)
Subjective:  Patient ID: Jacob Graves, male    DOB: 05/13/1955  Age: 67 y.o. MRN: [id number]  Chief Complaint  Patient presents with   Medical Management of Chronic Issues    HPI OSA: not wearing CPAP. Does not tolerate it.   COPD: Spiriva, proair. Breathing is good.   Hyperlipidemia: d/c atorvastatin 80 mg before bed due to it being linked to dementia.  Allergies: allegra, flonase. Using afrin generic 3 times a day.   Tobacco Use: Dropped from 1 ppd to 1/2 ppd on chantix 1 mg twice daily. Has been on chantix for over 6 months.   Hypertension: propranolol 60 mg twice daily.  GERD: on omeprazole 40 mg once daily.   Chronic back pain: no longer on narcotics. In suboxone program with [facility]MA Dr. Orvan Falconer.  Constipation: Miralax once daily helps.  Declined lung cancer screening or vaccines.     08/31/2022    9:17 AM 02/08/2022    2:01 PM 08/19/2021   11:03 AM 10/22/2020    4:21 PM 12/19/2019    9:49 AM  Depression screen PHQ 2/9  Decreased Interest 3 0 0 1 1  Down, Depressed, Hopeless 0 0 0 0 1  PHQ - 2 Score 3 0 0 1 2  Altered sleeping 3   2 0  Tired, decreased energy 3   2 1   Change in appetite 0   1 0  Feeling bad or failure about yourself  0   0 0  Trouble concentrating 3   1 1   Moving slowly or fidgety/restless 0   0 1  Suicidal thoughts 0   0 0  PHQ-9 Score 12   7 5   Difficult doing work/chores Very difficult   Somewhat difficult Not difficult at all        08/31/2022    9:17 AM  Fall Risk   Falls in the past year? 0  Number falls in past yr: 0  Injury with Fall? 0  Risk for fall due to : No Fall Risks  Follow up Falls evaluation completed    Patient Care Team: Blane Ohara, MD as PCP - General (Family Medicine) Olegario Shearer, MD as Referring Physician (Dermatology) Kalman Shan, MD as Consulting Physician (Pulmonary Disease) Earvin Hansen, [facility] (Inactive) as Pharmacist (Pharmacist)   Review of Systems  Constitutional:  Negative for chills,  diaphoresis, fatigue and fever.  HENT:  Negative for congestion, ear pain and sore throat.   Respiratory:  Negative for cough and shortness of breath.   Cardiovascular:  Negative for chest pain and leg swelling.  Gastrointestinal:  Positive for constipation. Negative for abdominal pain, diarrhea, nausea and vomiting.  Genitourinary:  Negative for dysuria and urgency.  Musculoskeletal:  Negative for arthralgias and myalgias.  Neurological:  Positive for dizziness. Negative for headaches.  Psychiatric/Behavioral:  Negative for dysphoric mood.     Current Outpatient Medications on File Prior to Visit  Medication Sig Dispense Refill   albuterol (VENTOLIN HFA) 108 (90 Base) MCG/ACT inhaler INHALE 2 PUFFS BY MOUTH EVERY 6 HOURS ASNEEDED FOR SHORT OF BREATH OR WHEEZING 8.5 g 1   Aspirin-Acetaminophen-Caffeine 500-325-65 MG PACK Take 325 mg by mouth 2 (two) times daily as needed.     Buprenorphine HCl-Naloxone HCl 8-2 MG FILM Place under the tongue.     fexofenadine (ALLEGRA) 180 MG tablet Take 1 tablet (180 mg total) by mouth daily. 90 tablet 3   fluticasone (FLONASE) 50 MCG/ACT nasal spray INHALE 1 SPRAY IN EACH NOSTRIL  EVERY DAY 16 g 2   gabapentin (NEURONTIN) 600 MG tablet TAKE ONE TABLET BY MOUTH EVERY MORNING AT NOON  IN THE EVENING  AND AT BEDTIME 360 tablet 3   ibuprofen (ADVIL) 800 MG tablet TAKE ONE TABLET BY MOUTH 3 TIMES A DAY 270 tablet 1   meclizine (ANTIVERT) 25 MG tablet TAKE ONE TABLET BY MOUTH 3 TIMES A DAY AS NEEDED FOR DIZZINESS 30 tablet 3   montelukast (SINGULAIR) 10 MG tablet TAKE ONE TABLET BY MOUTH EVERY DAY 90 tablet 2   ondansetron (ZOFRAN) 4 MG tablet TAKE ONE TABLET BY MOUTH EVERY 8 HOURS AS NEEDED FOR NAUSEA/VOMITING 60 tablet 1   polyethylene glycol powder (GLYCOLAX/MIRALAX) 17 GM/SCOOP powder MIX 17 GRAMS (1 CAPFUL) AS DIRECTED AND TAKE 2 TIMES A DAY 510 g 2   propranolol (INDERAL) 60 MG tablet TAKE 1 TABLET BY MOUTH TWICE  DAILY 200 tablet 2   SPIRIVA RESPIMAT 2.5  MCG/ACT AERS INHALE 2 PUFFS BY MOUTH EVERY DAY 4 g 2   tamsulosin (FLOMAX) 0.4 MG CAPS capsule TAKE 2 CAPSULES BY MOUTH DAILY  AFTER SUPPER 200 capsule 2   traZODone (DESYREL) 50 MG tablet TAKE 1/2-1 TABLET AT BEDTIME AS NEEDED FOR SLEEP 90 tablet 3   varenicline (CHANTIX) 1 MG tablet TAKE 1 TABLET BY MOUTH TWICE A DAY 56 tablet 3   No current facility-administered medications on file prior to visit.   Past Medical History:  Diagnosis Date   Apnea    Arthritis    Arthritis    spinal arthritis   Cancer (HCC)    melanoma, basal cell   Centrilobular emphysema (HCC) 07/08/2015   Depression    Emphysema of lung (HCC)    COPD   GERD (gastroesophageal reflux disease)    Hypercholesterolemia    Hypertension    Impaired fasting glucose    Kyphosis    mild   Migraine    Neuropathy    Osteoarthritis    Other testicular dysfunction    Scoliosis    Sleep apnea    Solitary pulmonary nodule    Tinnitus    Past Surgical History:  Procedure Laterality Date   COLONOSCOPY  2016   Colon polyps. Tubular Adenoma.   TONSILLECTOMY      Family History  Problem Relation Age of Onset   Heart disease Father    Stroke Father    Breast cancer Maternal Grandmother    Leukemia Mother    Skin cancer Mother    Diabetes Mother    Stroke Mother    Hypertension Mother    Skin cancer Brother    Cancer Brother        Melanoma   Hypertension Brother    Colon cancer Neg Hx    Social History   Socioeconomic History   Marital status: Married    Spouse name: Jacob Graves   Number of children: 2   Years of education: 12   Highest education level: Not on file  Occupational History   Occupation: disabled  Tobacco Use   Smoking status: Every Day    Packs/day: .5    Types: Cigarettes    Start date: 1974   Smokeless tobacco: Never   Tobacco comments:    currently smoking 3/4 ppd. For years smoked 1 1/2 ppd   Vaping Use   Vaping Use: Never used  Substance and Sexual Activity   Alcohol use: No     Alcohol/week: 0.0 standard drinks of alcohol   Drug use: No  Sexual activity: Not on file  Other Topics Concern   Not on file  Social History Narrative   Lives with wife   Caffeine- coffee, 3 cups daily   Social Determinants of Health   Financial Resource Strain: Low Risk  (02/08/2022)   Overall Financial Resource Strain (CARDIA)    Difficulty of Paying Living Expenses: Not hard at all  Food Insecurity: No Food Insecurity (02/08/2022)   Hunger Vital Sign    Worried About Running Out of Food in the Last Year: Never true    Ran Out of Food in the Last Year: Never true  Transportation Needs: No Transportation Needs (08/31/2022)   PRAPARE - Administrator, Civil Service (Medical): No    Lack of Transportation (Non-Medical): No  Physical Activity: Inactive (08/31/2022)   Exercise Vital Sign    Days of Exercise per Week: 0 days    Minutes of Exercise per Session: 0 min  Stress: No Stress Concern Present (02/08/2022)   [employer] of Occupational Health - Occupational Stress Questionnaire    Feeling of Stress : Not at all  Social Connections: Socially Integrated (02/08/2022)   Social Connection and Isolation Panel [NHANES]    Frequency of Communication with Friends and Family: More than three times a week    Frequency of Social Gatherings with Friends and Family: Once a week    Attends Religious Services: More than 4 times per year    Active Member of [employer] or Organizations: Yes    Attends Engineer, structural: More than 4 times per year    Marital Status: Married    Objective:  BP 120/78   Pulse 70   Temp (!) 97 F (36.1 C)   Ht 5\' 10"  (1.778 m)   Wt 176 lb (79.8 kg)   SpO2 96%   BMI 25.25 kg/m      08/31/2022    9:16 AM 02/08/2022    1:30 PM 10/19/2021    9:48 AM  BP/Weight  Systolic BP 120 106 110  Diastolic BP 78 72 60  Wt. (Lbs) 176 172.6 161.6  BMI 25.25 kg/m2 24.77 kg/m2 23.86 kg/m2    Physical Exam Vitals reviewed.  Constitutional:       Appearance: Normal appearance.  HENT:     Right Ear: Tympanic membrane normal.     Left Ear: Tympanic membrane normal.     Nose: Congestion present.     Mouth/Throat:     Pharynx: No oropharyngeal exudate or posterior oropharyngeal erythema.  Neck:     Vascular: No carotid bruit.  Cardiovascular:     Rate and Rhythm: Normal rate and regular rhythm.     Pulses: Normal pulses.     Heart sounds: Normal heart sounds.  Pulmonary:     Effort: Pulmonary effort is normal.     Breath sounds: Normal breath sounds. No wheezing, rhonchi or rales.  Abdominal:     General: Bowel sounds are normal.     Palpations: Abdomen is soft.     Tenderness: There is no abdominal tenderness.  Neurological:     Mental Status: He is alert and oriented to person, place, and time.  Psychiatric:        Mood and Affect: Mood normal.        Behavior: Behavior normal.     Diabetic Foot Exam - Simple   No data filed      Lab Results  Component Value Date   WBC 7.6 02/04/2022   HGB  13.7 02/04/2022   HCT 40.0 02/04/2022   PLT 214 02/04/2022   GLUCOSE 91 02/04/2022   CHOL 142 02/04/2022   TRIG 68 02/04/2022   HDL 56 02/04/2022   LDLCALC 72 02/04/2022   ALT 13 02/04/2022   AST 9 02/04/2022   NA 140 02/04/2022   K 4.3 02/04/2022   CL 99 02/04/2022   CREATININE 0.87 02/04/2022   BUN 14 02/04/2022   CO2 30 (H) 02/04/2022   TSH 2.550 02/04/2022   INR 1.0 01/27/2021   HGBA1C 5.9 (H) 02/04/2022      Assessment & Plan:    Essential hypertension, benign Assessment & Plan: Well controlled.  No changes to medicines. Continue  propranolol 60 mg twice daily  Continue to work on eating a healthy diet and exercise.  Labs drawn today.    Orders: -     CBC with Differential/Platelet -     Comprehensive metabolic panel  Gastroesophageal reflux disease without esophagitis Assessment & Plan: The current medical regimen is effective;  continue present plan and medications. Omeprazole 40 mg  daily    Prediabetes Assessment & Plan: Recommend continue to work on eating healthy diet and exercise.   Orders: -     Hemoglobin A1c  Mixed hyperlipidemia Assessment & Plan: Await labs/testing for assessment and recommendations. Patient stopped atorvastatin due to concern about side effect of dementia.  Continue to work on eating a healthy diet and exercise.  Labs drawn today.    Orders: -     Lipid panel  Benign prostatic hyperplasia without lower urinary tract symptoms -     PSA  Adverse effect of oxymetazoline, initial encounter Assessment & Plan: Patient experiencing rebound congestion. Discontinue use of afrin. Use flonase.     Cigarette nicotine dependence with nicotine-induced disorder Assessment & Plan: Recommended cessation. Continue chantix.    Centrilobular emphysema (HCC) Assessment & Plan: The current medical regimen is effective;  continue present plan and medications. Continue Spiriva, proair.    Other orders -     Omeprazole; TWICE DAILY.  Dispense: 200 capsule; Refill: 3     Meds ordered this encounter  Medications   omeprazole (PRILOSEC) 40 MG capsule    Sig: TWICE DAILY.    Dispense:  200 capsule    Refill:  3    Please send a replace/new response with 100-Day Supply if appropriate to maximize member benefit. Requesting 1 year supply.    Orders Placed This Encounter  Procedures   CBC with Differential/Platelet   Comprehensive metabolic panel   Hemoglobin A1c   Lipid panel   PSA     Follow-up: Return in about 3 months (around 12/01/2022) for chronic, fasting.   I,Marla I Leal-Borjas,acting as a scribe for Blane Ohara, MD.,have documented all relevant documentation on the behalf of Blane Ohara, MD,as directed by  Blane Ohara, MD while in the presence of Blane Ohara, MD.   An After Visit Summary was printed and given to the patient.  Blane Ohara, MD [facility] [phone]

## 2022-08-30 NOTE — Assessment & Plan Note (Signed)
Well controlled.  No changes to medicines. Continue  propranolol 60 mg once daily.  Continue to work on eating a healthy diet and exercise.  Labs drawn today.   

## 2022-08-30 NOTE — Assessment & Plan Note (Signed)
The current medical regimen is effective;  continue present plan and medications.  Omeprazole 40 mg daily 

## 2022-08-30 NOTE — Assessment & Plan Note (Signed)
Recommend continue to work on eating healthy diet and exercise.  

## 2022-08-31 ENCOUNTER — Encounter: Payer: Self-pay | Admitting: Family Medicine

## 2022-08-31 ENCOUNTER — Ambulatory Visit (INDEPENDENT_AMBULATORY_CARE_PROVIDER_SITE_OTHER): Payer: Medicare Other | Admitting: Family Medicine

## 2022-08-31 VITALS — BP 120/78 | HR 70 | Temp 97.0°F | Ht 70.0 in | Wt 176.0 lb

## 2022-08-31 DIAGNOSIS — F17210 Nicotine dependence, cigarettes, uncomplicated: Secondary | ICD-10-CM

## 2022-08-31 DIAGNOSIS — N400 Enlarged prostate without lower urinary tract symptoms: Secondary | ICD-10-CM

## 2022-08-31 DIAGNOSIS — T485X5A Adverse effect of other anti-common-cold drugs, initial encounter: Secondary | ICD-10-CM | POA: Diagnosis not present

## 2022-08-31 DIAGNOSIS — I10 Essential (primary) hypertension: Secondary | ICD-10-CM | POA: Diagnosis not present

## 2022-08-31 DIAGNOSIS — E782 Mixed hyperlipidemia: Secondary | ICD-10-CM | POA: Diagnosis not present

## 2022-08-31 DIAGNOSIS — J432 Centrilobular emphysema: Secondary | ICD-10-CM

## 2022-08-31 DIAGNOSIS — K219 Gastro-esophageal reflux disease without esophagitis: Secondary | ICD-10-CM | POA: Diagnosis not present

## 2022-08-31 DIAGNOSIS — F17219 Nicotine dependence, cigarettes, with unspecified nicotine-induced disorders: Secondary | ICD-10-CM

## 2022-08-31 DIAGNOSIS — R7303 Prediabetes: Secondary | ICD-10-CM

## 2022-08-31 MED ORDER — OMEPRAZOLE 40 MG PO CPDR: DELAYED_RELEASE_CAPSULE | ORAL | 3 refills | Status: DC

## 2022-08-31 NOTE — Patient Instructions (Addendum)
Increase omeprazole to 40 mg twice daily.  Stop otc Afrin (oxymetolazone)

## 2022-08-31 NOTE — Assessment & Plan Note (Signed)
The current medical regimen is effective;  continue present plan and medications. Continue Spiriva, proair.  

## 2022-08-31 NOTE — Assessment & Plan Note (Signed)
Patient experiencing rebound congestion. Discontinue use of afrin. Use flonase.

## 2022-08-31 NOTE — Assessment & Plan Note (Signed)
Recommended cessation. Continue chantix.

## 2022-09-01 LAB — COMPREHENSIVE METABOLIC PANEL
ALT: 6 IU/L (ref 0–44)
AST: 8 IU/L (ref 0–40)
Albumin: 4.2 g/dL (ref 3.9–4.9)
Alkaline Phosphatase: 107 IU/L (ref 44–121)
BUN/Creatinine Ratio: 11 (ref 10–24)
BUN: 10 mg/dL (ref 8–27)
Bilirubin Total: 0.3 mg/dL (ref 0.0–1.2)
CO2: 29 mmol/L (ref 20–29)
Calcium: 8.9 mg/dL (ref 8.6–10.2)
Chloride: 98 mmol/L (ref 96–106)
Creatinine, Ser: 0.95 mg/dL (ref 0.76–1.27)
Globulin, Total: 2 g/dL (ref 1.5–4.5)
Glucose: 89 mg/dL (ref 70–99)
Potassium: 5 mmol/L (ref 3.5–5.2)
Sodium: 140 mmol/L (ref 134–144)
Total Protein: 6.2 g/dL (ref 6.0–8.5)
eGFR: 89 mL/min/{1.73_m2} (ref >59)

## 2022-09-01 LAB — CBC WITH DIFFERENTIAL/PLATELET
Basophils Absolute: 0.1 10*3/uL (ref 0.0–0.2)
Basos: 2 %
EOS (ABSOLUTE): 0.5 10*3/uL — ABNORMAL HIGH (ref 0.0–0.4)
Eos: 8 %
Hematocrit: 40.8 % (ref 37.5–51.0)
Hemoglobin: 13.5 g/dL (ref 13.0–17.7)
Immature Grans (Abs): 0 10*3/uL (ref 0.0–0.1)
Immature Granulocytes: 0 %
Lymphocytes Absolute: 2.3 10*3/uL (ref 0.7–3.1)
Lymphs: 37 %
MCH: 29.7 pg (ref 26.6–33.0)
MCHC: 33.1 g/dL (ref 31.5–35.7)
MCV: 90 fL (ref 79–97)
Monocytes Absolute: 0.5 10*3/uL (ref 0.1–0.9)
Monocytes: 8 %
Neutrophils Absolute: 2.9 10*3/uL (ref 1.4–7.0)
Neutrophils: 45 %
Platelets: 207 10*3/uL (ref 150–450)
RBC: 4.55 x10E6/uL (ref 4.14–5.80)
RDW: 12.7 % (ref 11.6–15.4)
WBC: 6.3 10*3/uL (ref 3.4–10.8)

## 2022-09-01 LAB — LIPID PANEL
Chol/HDL Ratio: 6.2 ratio — ABNORMAL HIGH (ref 0.0–5.0)
Cholesterol, Total: 228 mg/dL — ABNORMAL HIGH (ref 100–199)
HDL: 37 mg/dL — ABNORMAL LOW (ref >39)
LDL Chol Calc (NIH): 157 mg/dL — ABNORMAL HIGH (ref 0–99)
Triglycerides: 185 mg/dL — ABNORMAL HIGH (ref 0–149)
VLDL Cholesterol Cal: 34 mg/dL (ref 5–40)

## 2022-09-01 LAB — HEMOGLOBIN A1C
Est. average glucose Bld gHb Est-mCnc: 123 mg/dL
Hgb A1c MFr Bld: 5.9 % — ABNORMAL HIGH (ref 4.8–5.6)

## 2022-09-01 LAB — PSA: Prostate Specific Ag, Serum: 1.1 ng/mL (ref 0.0–4.0)

## 2022-09-14 ENCOUNTER — Other Ambulatory Visit: Payer: Self-pay | Admitting: Family Medicine

## 2022-09-22 ENCOUNTER — Other Ambulatory Visit: Payer: Self-pay | Admitting: Family Medicine

## 2022-10-04 ENCOUNTER — Other Ambulatory Visit: Payer: Self-pay | Admitting: Family Medicine

## 2022-10-04 DIAGNOSIS — F17219 Nicotine dependence, cigarettes, with unspecified nicotine-induced disorders: Secondary | ICD-10-CM

## 2022-10-08 DIAGNOSIS — Z79899 Other long term (current) drug therapy: Secondary | ICD-10-CM | POA: Diagnosis not present

## 2022-10-14 ENCOUNTER — Other Ambulatory Visit: Payer: Self-pay | Admitting: Family Medicine

## 2022-10-14 DIAGNOSIS — J449 Chronic obstructive pulmonary disease, unspecified: Secondary | ICD-10-CM

## 2022-10-20 DIAGNOSIS — Z1211 Encounter for screening for malignant neoplasm of colon: Secondary | ICD-10-CM | POA: Diagnosis not present

## 2022-10-20 DIAGNOSIS — K2100 Gastro-esophageal reflux disease with esophagitis, without bleeding: Secondary | ICD-10-CM | POA: Diagnosis not present

## 2022-10-28 ENCOUNTER — Ambulatory Visit: Payer: Medicare Other | Admitting: Family Medicine

## 2022-10-28 VITALS — BP 148/80 | HR 72 | Temp 98.6°F | Resp 16 | Ht 70.0 in | Wt 178.6 lb

## 2022-10-28 DIAGNOSIS — H8113 Benign paroxysmal vertigo, bilateral: Secondary | ICD-10-CM

## 2022-10-28 NOTE — Progress Notes (Signed)
Acute Office Visit  Subjective:    Patient ID: Jacob Graves, male    DOB: 08/17/1955, 67 y.o.   MRN: [id number]  Chief Complaint  Patient presents with   Dizziness    HPI: Patient is in today for dizziness.  Patient reports he had dizziness come on 1 to 2 days ago he was leaning over and looking up over his shoulder in his workshop.  Patient had to sit down because he became so dizzy.  He has had episodes similar to this in the past.  The dizziness has resolved now.  Patient reports that when he rolls in bed sometimes he will get the dizziness.  He does try meclizine which seems to help some.  Patient did quit smoking!  He had promised his mother that he would quit by his 67th birthday which was 1 week ago.  His mother is also my patient.  Past Medical History:  Diagnosis Date   Apnea    Arthritis    Arthritis    spinal arthritis   Cancer (HCC)    melanoma, basal cell   Centrilobular emphysema (HCC) 07/08/2015   Depression    Emphysema of lung (HCC)    COPD   GERD (gastroesophageal reflux disease)    Hypercholesterolemia    Hypertension    Impaired fasting glucose    Kyphosis    mild   Migraine    Neuropathy    Osteoarthritis    Other testicular dysfunction    Scoliosis    Sleep apnea    Solitary pulmonary nodule    Tinnitus     Past Surgical History:  Procedure Laterality Date   COLONOSCOPY  2016   Colon polyps. Tubular Adenoma.   TONSILLECTOMY      Family History  Problem Relation Age of Onset   Heart disease Father    Stroke Father    Breast cancer Maternal Grandmother    Leukemia Mother    Skin cancer Mother    Diabetes Mother    Stroke Mother    Hypertension Mother    Skin cancer Brother    Cancer Brother        Melanoma   Hypertension Brother    Colon cancer Neg Hx     Social History   Socioeconomic History   Marital status: Married    Spouse name: Dondra Spry   Number of children: 2   Years of education: 12   Highest education level: Not on  file  Occupational History   Occupation: disabled  Tobacco Use   Smoking status: Every Day    Current packs/day: 0.50    Average packs/day: 0.5 packs/day for 50.7 years (25.3 ttl pk-yrs)    Types: Cigarettes    Start date: 1974   Smokeless tobacco: Never   Tobacco comments:    currently smoking 3/4 ppd. For years smoked 1 1/2 ppd   Vaping Use   Vaping status: Never Used  Substance and Sexual Activity   Alcohol use: No    Alcohol/week: 0.0 standard drinks of alcohol   Drug use: No   Sexual activity: Not on file  Other Topics Concern   Not on file  Social History Narrative   Lives with wife   Caffeine- coffee, 3 cups daily   Social Determinants of Health   Financial Resource Strain: Low Risk  (02/08/2022)   Overall Financial Resource Strain (CARDIA)    Difficulty of Paying Living Expenses: Not hard at all  Food Insecurity: No Food Insecurity (  02/08/2022)   Hunger Vital Sign    Worried About Running Out of Food in the Last Year: Never true    Ran Out of Food in the Last Year: Never true  Transportation Needs: No Transportation Needs (08/31/2022)   PRAPARE - Administrator, Civil Service (Medical): No    Lack of Transportation (Non-Medical): No  Physical Activity: Inactive (08/31/2022)   Exercise Vital Sign    Days of Exercise per Week: 0 days    Minutes of Exercise per Session: 0 min  Stress: No Stress Concern Present (02/08/2022)   [employer] of Occupational Health - Occupational Stress Questionnaire    Feeling of Stress : Not at all  Social Connections: Unknown (10/26/2022)   [facility]   Social Network    Social Network: Not on file  Intimate Partner Violence: Unknown (10/26/2022)   Received from [facility]   HITS    Physically Hurt: Not on file    Insult or Talk Down To: Not on file    Threaten Physical Harm: Not on file    Scream or Curse: Not on file    Outpatient Medications Prior to Visit  Medication Sig Dispense Refill    albuterol (VENTOLIN HFA) 108 (90 Base) MCG/ACT inhaler INHALE 2 PUFFS BY MOUTH EVERY 6 HOURS ASNEEDED FOR SHORT OF BREATH OR WHEEZING 8.5 g 1   Aspirin-Acetaminophen-Caffeine 500-325-65 MG PACK Take 325 mg by mouth 2 (two) times daily as needed.     Buprenorphine HCl-Naloxone HCl 8-2 MG FILM Place under the tongue every other day.     fexofenadine (ALLEGRA) 180 MG tablet Take 1 tablet (180 mg total) by mouth daily. 90 tablet 3   fluticasone (FLONASE) 50 MCG/ACT nasal spray INHALE 1 SPRAY IN EACH NOSTRIL EVERY DAY 16 g 2   gabapentin (NEURONTIN) 600 MG tablet TAKE ONE TABLET BY MOUTH EVERY MORNING AT NOON  IN THE EVENING  AND AT BEDTIME 360 tablet 3   ibuprofen (ADVIL) 800 MG tablet TAKE ONE TABLET BY MOUTH 3 TIMES A DAY 270 tablet 1   meclizine (ANTIVERT) 25 MG tablet TAKE ONE TABLET BY MOUTH 3 TIMES A DAY AS NEEDED FOR DIZZINESS 30 tablet 3   montelukast (SINGULAIR) 10 MG tablet TAKE ONE TABLET BY MOUTH EVERY DAY 90 tablet 2   omeprazole (PRILOSEC) 40 MG capsule TWICE DAILY. 200 capsule 3   ondansetron (ZOFRAN) 4 MG tablet TAKE ONE TABLET BY MOUTH EVERY 8 HOURS AS NEEDED FOR NAUSEA/VOMITING 60 tablet 1   polyethylene glycol powder (GLYCOLAX/MIRALAX) 17 GM/SCOOP powder MIX 17 GRAMS (1 CAPFUL) AS DIRECTED AND TAKE 2 TIMES A DAY 510 g 2   propranolol (INDERAL) 60 MG tablet TAKE 1 TABLET BY MOUTH TWICE  DAILY 200 tablet 2   SPIRIVA RESPIMAT 2.5 MCG/ACT AERS INHALE 2 PUFFS BY MOUTH EVERY DAY 4 g 2   tamsulosin (FLOMAX) 0.4 MG CAPS capsule TAKE 2 CAPSULES BY MOUTH DAILY  AFTER SUPPER 200 capsule 2   traZODone (DESYREL) 50 MG tablet TAKE 1/2-1 TABLET AT BEDTIME AS NEEDED FOR SLEEP 90 tablet 3   varenicline (CHANTIX) 1 MG tablet TAKE 1 TABLET BY MOUTH TWICE A DAY 168 tablet 1   No facility-administered medications prior to visit.    Allergies  Allergen Reactions   Codeine Nausea Only and Nausea And Vomiting   Duloxetine     Loss of appetite   Fentanyl     Fatigue    Fluoxetine     Agitation  Ipratropium-Albuterol     Myalgia   Penicillins Itching and Other (See Comments)    As child    Zoloft [Sertraline]     Sweating     Review of Systems  Constitutional:  Negative for chills and fever.  HENT:  Positive for congestion, hearing loss (Has hearing aids) and tinnitus (For 2 years.). Negative for rhinorrhea and sore throat.   Respiratory:  Negative for shortness of breath.   Cardiovascular:  Negative for chest pain.  Gastrointestinal:  Negative for nausea.  Genitourinary:  Negative for urgency.  Musculoskeletal:  Positive for back pain. Negative for arthralgias and myalgias.  Neurological:  Positive for dizziness. Negative for headaches.  Psychiatric/Behavioral:  Negative for dysphoric mood. The patient is not nervous/anxious.        Objective:        10/28/2022    9:19 AM 10/28/2022    8:39 AM 08/31/2022    9:16 AM  Vitals with BMI  Height  5\' 10"  5\' 10"   Weight  178 lbs 10 oz 176 lbs  BMI  25.63 25.25  Systolic 148 150 644  Diastolic 80 86 78  Pulse  72 70   Orthostatic VS for the past 72 hrs (Last 3 readings):  Orthostatic BP Patient Position BP Location Cuff Size Orthostatic Pulse  10/28/22 0852 150/90 Standing Left Arm Normal 60  10/28/22 0851 152/84 Sitting Left Arm Normal 60  10/28/22 0850 172/90 Supine Left Arm Normal 72    Physical Exam Vitals reviewed.  Constitutional:      Appearance: Normal appearance.  HENT:     Right Ear: Tympanic membrane normal.     Left Ear: Tympanic membrane normal.     Nose: No congestion.     Mouth/Throat:     Pharynx: No oropharyngeal exudate or posterior oropharyngeal erythema.  Neck:     Vascular: No carotid bruit.  Cardiovascular:     Rate and Rhythm: Normal rate and regular rhythm.     Heart sounds: Normal heart sounds.  Pulmonary:     Effort: Pulmonary effort is normal.     Breath sounds: Normal breath sounds. No wheezing, rhonchi or rales.  Musculoskeletal:     Comments: Neg epley maneuver, but patient  had issues understanding my instructions so it may not have been accurate.  Neurological:     Mental Status: He is alert and oriented to person, place, and time.     Cranial Nerves: No cranial nerve deficit.     Comments: No nystagmus.  Psychiatric:        Mood and Affect: Mood normal.        Behavior: Behavior normal.     Health Maintenance Due  Topic Date Due   HIV Screening  Never done   Zoster Vaccines- Shingrix (1 of 2) Never done   Medicare Annual Wellness (AWV)  11/13/2020   Lung Cancer Screening  11/11/2021   INFLUENZA VACCINE  09/30/2022   Pneumonia Vaccine 28+ Years old (3 of 3 - PPSV23 or PCV20) 10/27/2022    There are no preventive care reminders to display for this patient.   Lab Results  Component Value Date   TSH 2.550 02/04/2022   Lab Results  Component Value Date   WBC 6.3 08/31/2022   HGB 13.5 08/31/2022   HCT 40.8 08/31/2022   MCV 90 08/31/2022   PLT 207 08/31/2022   Lab Results  Component Value Date   NA 140 08/31/2022   K 5.0 08/31/2022   CO2 29  08/31/2022   GLUCOSE 89 08/31/2022   BUN 10 08/31/2022   CREATININE 0.95 08/31/2022   BILITOT 0.3 08/31/2022   ALKPHOS 107 08/31/2022   AST 8 08/31/2022   ALT 6 08/31/2022   PROT 6.2 08/31/2022   ALBUMIN 4.2 08/31/2022   CALCIUM 8.9 08/31/2022   EGFR 89 08/31/2022   GFR 95.22 07/15/2017   Lab Results  Component Value Date   CHOL 228 (H) 08/31/2022   Lab Results  Component Value Date   HDL 37 (L) 08/31/2022   Lab Results  Component Value Date   LDLCALC 157 (H) 08/31/2022   Lab Results  Component Value Date   TRIG 185 (H) 08/31/2022   Lab Results  Component Value Date   CHOLHDL 6.2 (H) 08/31/2022   Lab Results  Component Value Date   HGBA1C 5.9 (H) 08/31/2022       Assessment & Plan:  Benign paroxysmal positional vertigo due to bilateral vestibular disorder Assessment & Plan: Sent a referral to ENT and physical therapy.  Orders: -     Ambulatory referral to Physical  Therapy -     Ambulatory referral to ENT     No orders of the defined types were placed in this encounter.   Orders Placed This Encounter  Procedures   Ambulatory referral to Physical Therapy   Ambulatory referral to ENT     Follow-up: Return in about 1 week (around 11/04/2022) for Nurse visit.  An After Visit Summary was printed and given to the patient.  Total time spent on today's visit was greater than 30 minutes, including both face-to-face time and nonface-to-face time personally spent on review of chart (labs and imaging), discussing labs and goals, discussing further work-up, treatment options, referrals to specialist if needed, reviewing outside records of pertinent, answering patient's questions, and coordinating care.  Clayborn Bigness I Leal-Borjas,acting as a scribe for Blane Ohara, MD.,have documented all relevant documentation on the behalf of Blane Ohara, MD,as directed by  Blane Ohara, MD while in the presence of Blane Ohara, MD.   Blane Ohara, MD [facility] [phone]

## 2022-10-29 ENCOUNTER — Encounter: Payer: Self-pay | Admitting: Family Medicine

## 2022-10-30 ENCOUNTER — Encounter: Payer: Self-pay | Admitting: Family Medicine

## 2022-10-30 DIAGNOSIS — H8113 Benign paroxysmal vertigo, bilateral: Secondary | ICD-10-CM | POA: Insufficient documentation

## 2022-10-30 NOTE — Assessment & Plan Note (Signed)
Sent a referral to ENT and physical therapy.

## 2022-11-04 ENCOUNTER — Ambulatory Visit: Payer: Medicare Other

## 2022-11-04 ENCOUNTER — Other Ambulatory Visit: Payer: Self-pay

## 2022-11-04 DIAGNOSIS — I10 Essential (primary) hypertension: Secondary | ICD-10-CM

## 2022-11-04 MED ORDER — VALSARTAN 80 MG PO TABS
80.0000 mg | ORAL_TABLET | Freq: Every day | ORAL | 0 refills | Status: DC
Start: 2022-11-04 — End: 2022-12-06

## 2022-11-04 NOTE — Progress Notes (Signed)
Patient is in office today for a nurse visit for Blood Pressure Check. Patient blood pressure was 130/92, Patient No chest pain, No shortness of breath, No dyspnea on exertion, No orthopnea, No paroxysmal nocturnal dyspnea, No edema, No palpitations, No syncope, After 10 minutes it was checked again 160/80 and checked again before patient left it was 150/80.

## 2022-11-10 DIAGNOSIS — I10 Essential (primary) hypertension: Secondary | ICD-10-CM | POA: Diagnosis not present

## 2022-11-10 DIAGNOSIS — Z885 Allergy status to narcotic agent status: Secondary | ICD-10-CM | POA: Diagnosis not present

## 2022-11-10 DIAGNOSIS — K219 Gastro-esophageal reflux disease without esophagitis: Secondary | ICD-10-CM | POA: Diagnosis not present

## 2022-11-10 DIAGNOSIS — G4730 Sleep apnea, unspecified: Secondary | ICD-10-CM | POA: Diagnosis not present

## 2022-11-10 DIAGNOSIS — F17210 Nicotine dependence, cigarettes, uncomplicated: Secondary | ICD-10-CM | POA: Diagnosis not present

## 2022-11-10 DIAGNOSIS — E785 Hyperlipidemia, unspecified: Secondary | ICD-10-CM | POA: Diagnosis not present

## 2022-11-10 DIAGNOSIS — K635 Polyp of colon: Secondary | ICD-10-CM | POA: Diagnosis not present

## 2022-11-10 DIAGNOSIS — K209 Esophagitis, unspecified without bleeding: Secondary | ICD-10-CM | POA: Diagnosis not present

## 2022-11-10 DIAGNOSIS — J449 Chronic obstructive pulmonary disease, unspecified: Secondary | ICD-10-CM | POA: Diagnosis not present

## 2022-11-10 DIAGNOSIS — Z880 Allergy status to penicillin: Secondary | ICD-10-CM | POA: Diagnosis not present

## 2022-11-10 DIAGNOSIS — K5732 Diverticulitis of large intestine without perforation or abscess without bleeding: Secondary | ICD-10-CM | POA: Diagnosis not present

## 2022-11-10 DIAGNOSIS — K5730 Diverticulosis of large intestine without perforation or abscess without bleeding: Secondary | ICD-10-CM | POA: Diagnosis not present

## 2022-11-10 DIAGNOSIS — F17200 Nicotine dependence, unspecified, uncomplicated: Secondary | ICD-10-CM | POA: Diagnosis not present

## 2022-11-10 DIAGNOSIS — K449 Diaphragmatic hernia without obstruction or gangrene: Secondary | ICD-10-CM | POA: Diagnosis not present

## 2022-11-10 DIAGNOSIS — K1370 Unspecified lesions of oral mucosa: Secondary | ICD-10-CM | POA: Diagnosis not present

## 2022-11-10 DIAGNOSIS — Z1211 Encounter for screening for malignant neoplasm of colon: Secondary | ICD-10-CM | POA: Diagnosis not present

## 2022-11-10 DIAGNOSIS — Z79899 Other long term (current) drug therapy: Secondary | ICD-10-CM | POA: Diagnosis not present

## 2022-11-10 DIAGNOSIS — Z86010 Personal history of colonic polyps: Secondary | ICD-10-CM | POA: Diagnosis not present

## 2022-11-10 DIAGNOSIS — K222 Esophageal obstruction: Secondary | ICD-10-CM | POA: Diagnosis not present

## 2022-11-11 DIAGNOSIS — Z1211 Encounter for screening for malignant neoplasm of colon: Secondary | ICD-10-CM | POA: Diagnosis not present

## 2022-11-11 DIAGNOSIS — K5730 Diverticulosis of large intestine without perforation or abscess without bleeding: Secondary | ICD-10-CM | POA: Diagnosis not present

## 2022-11-11 DIAGNOSIS — K219 Gastro-esophageal reflux disease without esophagitis: Secondary | ICD-10-CM | POA: Diagnosis not present

## 2022-11-18 ENCOUNTER — Encounter: Payer: Self-pay | Admitting: Family Medicine

## 2022-11-18 ENCOUNTER — Ambulatory Visit (INDEPENDENT_AMBULATORY_CARE_PROVIDER_SITE_OTHER): Payer: Medicare Other | Admitting: Family Medicine

## 2022-11-18 VITALS — BP 138/88 | HR 67 | Temp 98.2°F | Ht 70.0 in | Wt 176.0 lb

## 2022-11-18 DIAGNOSIS — I10 Essential (primary) hypertension: Secondary | ICD-10-CM | POA: Diagnosis not present

## 2022-11-18 DIAGNOSIS — R42 Dizziness and giddiness: Secondary | ICD-10-CM

## 2022-11-18 NOTE — Assessment & Plan Note (Signed)
Resolved Meclizine 25 mg by mouth THREE TIMES A DAY as needed. Varsartan 80 mg started once daily started 2 weeks ago

## 2022-11-18 NOTE — Progress Notes (Addendum)
Subjective:  Patient ID: Jacob Graves, male    DOB: 07-01-1955  Age: 67 y.o. MRN: [id number]  Chief Complaint  Patient presents with   Hypertension    HPI   Per Last note:  Patient reports he had dizziness come on 1 to 2 days ago he was leaning over and looking up over his shoulder in his workshop.  Patient had to sit down because he became so dizzy.  He has had episodes similar to this in the past.  The dizziness has resolved now.  Patient reports that when he rolls in bed sometimes he will get the dizziness.  He does try meclizine which seems to help some.   Hypertension: Today patient states since starting on Valsartan he has not felt dizzy. Varsartan 80 mg by mouth once daily. Denies chest pain, shortness of breath or lightheadedness.  Vertigo: Patient states that he isn't having dizziness like he was before. Denies spinning. He states that he hasn't had to use his meclizine since starting the Varsartan. Patient has been busy doing yard work.  Patient declined covid, flu, pneumonia and shingrix vaccines. Declined Lung CT at this time, down to 2 cigarettes/day     10/28/2022    8:43 AM 08/31/2022    9:17 AM 02/08/2022    2:01 PM 08/19/2021   11:03 AM 10/22/2020    4:21 PM  Depression screen PHQ 2/9  Decreased Interest 0 3 0 0 1  Down, Depressed, Hopeless 0 0 0 0 0  PHQ - 2 Score 0 3 0 0 1  Altered sleeping 2 3   2   Tired, decreased energy 3 3   2   Change in appetite 3 0   1  Feeling bad or failure about yourself  0 0   0  Trouble concentrating 2 3   1   Moving slowly or fidgety/restless 0 0   0  Suicidal thoughts 0 0   0  PHQ-9 Score 10 12   7   Difficult doing work/chores Somewhat difficult Very difficult   Somewhat difficult        10/28/2022    8:43 AM  Fall Risk   Falls in the past year? 1  Number falls in past yr: 0  Injury with Fall? 0  Risk for fall due to : Impaired balance/gait  Follow up Falls evaluation completed;Falls prevention discussed    Patient Care  Team: Blane Ohara, MD as PCP - General (Family Medicine) Olegario Shearer, MD as Referring Physician (Dermatology) Kalman Shan, MD as Consulting Physician (Pulmonary Disease) Earvin Hansen, [facility] (Inactive) as Pharmacist (Pharmacist)   Review of Systems  Constitutional:  Negative for chills, diaphoresis, fatigue and fever.  HENT:  Negative for congestion, ear pain and sore throat.   Respiratory:  Negative for cough and shortness of breath.   Cardiovascular:  Negative for chest pain and leg swelling.  Gastrointestinal:  Negative for abdominal pain, constipation, diarrhea, nausea and vomiting.  Genitourinary:  Negative for dysuria and urgency.  Musculoskeletal:  Negative for arthralgias and myalgias.  Neurological:  Negative for dizziness and headaches.  Psychiatric/Behavioral:  Negative for dysphoric mood.     Current Outpatient Medications on File Prior to Visit  Medication Sig Dispense Refill   albuterol (VENTOLIN HFA) 108 (90 Base) MCG/ACT inhaler INHALE 2 PUFFS BY MOUTH EVERY 6 HOURS ASNEEDED FOR SHORT OF BREATH OR WHEEZING 8.5 g 1   Aspirin-Acetaminophen-Caffeine 500-325-65 MG PACK Take 325 mg by mouth 2 (two) times daily as needed.  Buprenorphine HCl-Naloxone HCl 8-2 MG FILM Place under the tongue every other day.     fexofenadine (ALLEGRA) 180 MG tablet Take 1 tablet (180 mg total) by mouth daily. 90 tablet 3   fluticasone (FLONASE) 50 MCG/ACT nasal spray INHALE 1 SPRAY IN EACH NOSTRIL EVERY DAY 16 g 2   gabapentin (NEURONTIN) 600 MG tablet TAKE ONE TABLET BY MOUTH EVERY MORNING AT NOON  IN THE EVENING  AND AT BEDTIME 360 tablet 3   ibuprofen (ADVIL) 800 MG tablet TAKE ONE TABLET BY MOUTH 3 TIMES A DAY 270 tablet 1   meclizine (ANTIVERT) 25 MG tablet TAKE ONE TABLET BY MOUTH 3 TIMES A DAY AS NEEDED FOR DIZZINESS 30 tablet 3   montelukast (SINGULAIR) 10 MG tablet TAKE ONE TABLET BY MOUTH EVERY DAY 90 tablet 2   omeprazole (PRILOSEC) 40 MG capsule TWICE DAILY. 200 capsule 3    ondansetron (ZOFRAN) 4 MG tablet TAKE ONE TABLET BY MOUTH EVERY 8 HOURS AS NEEDED FOR NAUSEA/VOMITING 60 tablet 1   polyethylene glycol powder (GLYCOLAX/MIRALAX) 17 GM/SCOOP powder MIX 17 GRAMS (1 CAPFUL) AS DIRECTED AND TAKE 2 TIMES A DAY 510 g 2   propranolol (INDERAL) 60 MG tablet TAKE 1 TABLET BY MOUTH TWICE  DAILY 200 tablet 2   SPIRIVA RESPIMAT 2.5 MCG/ACT AERS INHALE 2 PUFFS BY MOUTH EVERY DAY 4 g 2   tamsulosin (FLOMAX) 0.4 MG CAPS capsule TAKE 2 CAPSULES BY MOUTH DAILY  AFTER SUPPER 200 capsule 2   traZODone (DESYREL) 50 MG tablet TAKE 1/2-1 TABLET AT BEDTIME AS NEEDED FOR SLEEP 90 tablet 3   valsartan (DIOVAN) 80 MG tablet Take 1 tablet (80 mg total) by mouth daily. 30 tablet 0   varenicline (CHANTIX) 1 MG tablet TAKE 1 TABLET BY MOUTH TWICE A DAY 168 tablet 1   No current facility-administered medications on file prior to visit.   Past Medical History:  Diagnosis Date   Apnea    Arthritis    Arthritis    spinal arthritis   Cancer (HCC)    melanoma, basal cell   Centrilobular emphysema (HCC) 07/08/2015   Depression    Emphysema of lung (HCC)    COPD   GERD (gastroesophageal reflux disease)    Hypercholesterolemia    Hypertension    Impaired fasting glucose    Kyphosis    mild   Migraine    Neuropathy    Osteoarthritis    Other testicular dysfunction    Scoliosis    Sleep apnea    Solitary pulmonary nodule    Tinnitus    Past Surgical History:  Procedure Laterality Date   COLONOSCOPY  2016   Colon polyps. Tubular Adenoma.   TONSILLECTOMY      Family History  Problem Relation Age of Onset   Heart disease Father    Stroke Father    Breast cancer Maternal Grandmother    Leukemia Mother    Skin cancer Mother    Diabetes Mother    Stroke Mother    Hypertension Mother    Skin cancer Brother    Cancer Brother        Melanoma   Hypertension Brother    Colon cancer Neg Hx    Social History   Socioeconomic History   Marital status: Married    Spouse  name: Dondra Spry   Number of children: 2   Years of education: 12   Highest education level: Not on file  Occupational History   Occupation: disabled  Tobacco Use  Smoking status: Every Day    Current packs/day: 0.50    Average packs/day: 0.5 packs/day for 50.7 years (25.4 ttl pk-yrs)    Types: Cigarettes    Start date: 19   Smokeless tobacco: Never   Tobacco comments:    currently smoking 3/4 ppd. For years smoked 1 1/2 ppd   Vaping Use   Vaping status: Never Used  Substance and Sexual Activity   Alcohol use: No    Alcohol/week: 0.0 standard drinks of alcohol   Drug use: No   Sexual activity: Not on file  Other Topics Concern   Not on file  Social History Narrative   Lives with wife   Caffeine- coffee, 3 cups daily   Social Determinants of Health   Financial Resource Strain: Low Risk  (02/08/2022)   Overall Financial Resource Strain (CARDIA)    Difficulty of Paying Living Expenses: Not hard at all  Food Insecurity: No Food Insecurity (02/08/2022)   Hunger Vital Sign    Worried About Running Out of Food in the Last Year: Never true    Ran Out of Food in the Last Year: Never true  Transportation Needs: No Transportation Needs (08/31/2022)   PRAPARE - Administrator, Civil Service (Medical): No    Lack of Transportation (Non-Medical): No  Physical Activity: Inactive (08/31/2022)   Exercise Vital Sign    Days of Exercise per Week: 0 days    Minutes of Exercise per Session: 0 min  Stress: No Stress Concern Present (11/10/2022)   Received from [facility] of Occupational Health - Occupational Stress Questionnaire    Feeling of Stress : Only a little  Social Connections: Socially Integrated (11/18/2022)   Social Connection and Isolation Panel [NHANES]    Frequency of Communication with Friends and Family: More than three times a week    Frequency of Social Gatherings with Friends and Family: Once a week    Attends Religious Services: More than 4  times per year    Active Member of [employer] or Organizations: Yes    Attends Engineer, structural: More than 4 times per year    Marital Status: Married    Objective:  BP 138/88   Pulse 67   Temp 98.2 F (36.8 C)   Ht 5\' 10"  (1.778 m)   Wt 176 lb (79.8 kg)   SpO2 96%   BMI 25.25 kg/m      11/18/2022   10:09 AM 11/04/2022    8:37 AM 11/04/2022    8:20 AM  BP/Weight  Systolic BP 138 150 160  Diastolic BP 88 80 80  Wt. (Lbs) 176    BMI 25.25 kg/m2      Physical Exam Constitutional:      General: He is not in acute distress.    Appearance: He is not ill-appearing.  Neck:     Vascular: No carotid bruit.  Cardiovascular:     Rate and Rhythm: Normal rate and regular rhythm.     Heart sounds: Normal heart sounds.  Pulmonary:     Effort: Pulmonary effort is normal.     Breath sounds: Normal breath sounds. No wheezing.  Skin:    General: Skin is warm.  Neurological:     Mental Status: He is alert.  Psychiatric:        Mood and Affect: Mood normal.      Lab Results  Component Value Date   WBC 6.3 08/31/2022   HGB 13.5 08/31/2022  HCT 40.8 08/31/2022   PLT 207 08/31/2022   GLUCOSE 89 08/31/2022   CHOL 228 (H) 08/31/2022   TRIG 185 (H) 08/31/2022   HDL 37 (L) 08/31/2022   LDLCALC 157 (H) 08/31/2022   ALT 6 08/31/2022   AST 8 08/31/2022   NA 140 08/31/2022   K 5.0 08/31/2022   CL 98 08/31/2022   CREATININE 0.95 08/31/2022   BUN 10 08/31/2022   CO2 29 08/31/2022   TSH 2.550 02/04/2022   INR 1.0 01/27/2021   HGBA1C 5.9 (H) 08/31/2022      Assessment & Plan:    Essential hypertension, benign Assessment & Plan: Well controlled Propranolol 60 mg by mouth TWICE A DAY and Valsartan 80 mg by mouth once daily. Continue to work on eating a healthy diet and exercise. Fu appointment in October 2024   Vertigo Assessment & Plan: Resolved Meclizine 25 mg by mouth THREE TIMES A DAY as needed. Varsartan 80 mg started once daily started 2 weeks  ago    Follow-up: No follow-ups on file.   I,Katherina A Bramblett,acting as a scribe for Renne Crigler, FNP.,have documented all relevant documentation on the behalf of Renne Crigler, FNP,as directed by  Renne Crigler, FNP while in the presence of Renne Crigler, FNP.   An After Visit Summary was printed and given to the patient.  Total time spent on today's visit was greater than 20 minutes, including both face-to-face time and nonface-to-face time personally spent on review of chart (labs and imaging), discussing labs and goals, discussing further work-up, treatment options, referrals to specialist if needed, reviewing outside records if pertinent, answering patient's questions, and coordinating care.   Renne Crigler, FNP [facility] [phone]

## 2022-11-18 NOTE — Assessment & Plan Note (Signed)
Well controlled Propranolol 60 mg by mouth TWICE A DAY and Valsartan 80 mg by mouth once daily. Continue to work on eating a healthy diet and exercise. Fu appointment in October 2024

## 2022-11-19 ENCOUNTER — Other Ambulatory Visit: Payer: Self-pay | Admitting: Family Medicine

## 2022-12-02 DIAGNOSIS — Z79899 Other long term (current) drug therapy: Secondary | ICD-10-CM | POA: Diagnosis not present

## 2022-12-06 ENCOUNTER — Other Ambulatory Visit: Payer: Self-pay | Admitting: Family Medicine

## 2022-12-06 DIAGNOSIS — I10 Essential (primary) hypertension: Secondary | ICD-10-CM

## 2022-12-13 ENCOUNTER — Other Ambulatory Visit: Payer: Self-pay | Admitting: Family Medicine

## 2022-12-16 NOTE — Assessment & Plan Note (Signed)
Recommend continue to work on eating healthy diet and exercise.

## 2022-12-16 NOTE — Assessment & Plan Note (Addendum)
The current medical regimen is effective;  continue present plan and medications. Continue otc zantac daily.

## 2022-12-16 NOTE — Assessment & Plan Note (Addendum)
Poorly controlled. Recommend start on zetia 10 mg before bed.  Continue to work on eating a healthy diet and exercise.  Labs drawn today.

## 2022-12-16 NOTE — Progress Notes (Unsigned)
Subjective:  Patient ID: Jacob Graves, male    DOB: 12-Nov-1955  Age: 67 y.o. MRN: [id number]  Chief Complaint  Patient presents with   Medical Management of Chronic Issues    HPI OSA: not wearing CPAP. Does not tolerate it.   COPD: Spiriva, proair. Breathing is good.   Hyperlipidemia: d/c atorvastatin 80 mg before bed due to it being linked to dementia.  Allergies: allegra, flonase. Using afrin generic as needed. Infrequently use.   Tobacco Use: Dropped from 1 ppd to 1/4 ppd on chantix 1 mg twice daily. Has been on chantix for over 6 months.   Hypertension: propranolol 60 mg twice daily.  GERD: Started taking zantac yesterday and d/c omeprazole due to it no longer helping. Zantac is helping more.   Chronic back pain: no longer on narcotics. In suboxone program with [facility] Dr. Orvan Falconer. Will have pain in back that causes his legs to go numb and weak.  Constipation: Miralax once daily helps.     10/28/2022    8:43 AM 08/31/2022    9:17 AM 02/08/2022    2:01 PM 08/19/2021   11:03 AM 10/22/2020    4:21 PM  Depression screen PHQ 2/9  Decreased Interest 0 3 0 0 1  Down, Depressed, Hopeless 0 0 0 0 0  PHQ - 2 Score 0 3 0 0 1  Altered sleeping 2 3   2   Tired, decreased energy 3 3   2   Change in appetite 3 0   1  Feeling bad or failure about yourself  0 0   0  Trouble concentrating 2 3   1   Moving slowly or fidgety/restless 0 0   0  Suicidal thoughts 0 0   0  PHQ-9 Score 10 12   7   Difficult doing work/chores Somewhat difficult Very difficult   Somewhat difficult        10/28/2022    8:43 AM  Fall Risk   Falls in the past year? 1  Number falls in past yr: 0  Injury with Fall? 0  Risk for fall due to : Impaired balance/gait  Follow up Falls evaluation completed;Falls prevention discussed    Patient Care Team: Blane Ohara, MD as PCP - General (Family Medicine) Olegario Shearer, MD as Referring Physician (Dermatology) Kalman Shan, MD as Consulting Physician  (Pulmonary Disease) Earvin Hansen, [facility] (Inactive) as Pharmacist (Pharmacist)   Review of Systems  Constitutional:  Negative for chills, diaphoresis, fatigue and fever.  HENT:  Negative for congestion, ear pain and sore throat.   Respiratory:  Negative for cough and shortness of breath.   Cardiovascular:  Negative for chest pain and leg swelling.  Gastrointestinal:  Positive for constipation. Negative for abdominal pain, diarrhea, nausea and vomiting.  Genitourinary:  Negative for dysuria and urgency.  Musculoskeletal:  Positive for back pain. Negative for arthralgias and myalgias.  Neurological:  Negative for dizziness and headaches.  Psychiatric/Behavioral:  Negative for dysphoric mood.     Current Outpatient Medications on File Prior to Visit  Medication Sig Dispense Refill   raNITIdine HCl (ZANTAC PO) Take by mouth.     albuterol (VENTOLIN HFA) 108 (90 Base) MCG/ACT inhaler INHALE 2 PUFFS BY MOUTH EVERY 6 HOURS ASNEEDED FOR SHORT OF BREATH OR WHEEZING 8.5 g 1   Aspirin-Acetaminophen-Caffeine 500-325-65 MG PACK Take 325 mg by mouth 2 (two) times daily as needed.     Buprenorphine HCl-Naloxone HCl 8-2 MG FILM Place under the tongue every other day.  fexofenadine (ALLEGRA) 180 MG tablet Take 1 tablet (180 mg total) by mouth daily. 90 tablet 3   fluticasone (FLONASE) 50 MCG/ACT nasal spray INHALE 1 SPRAY IN EACH NOSTRIL EVERY DAY 16 g 2   gabapentin (NEURONTIN) 600 MG tablet TAKE ONE TABLET BY MOUTH EVERY MORNING AT NOON  IN THE EVENING  AND AT BEDTIME 360 tablet 3   ibuprofen (ADVIL) 800 MG tablet TAKE ONE TABLET BY MOUTH 3 TIMES A DAY 270 tablet 1   meclizine (ANTIVERT) 25 MG tablet TAKE ONE TABLET BY MOUTH 3 TIMES A DAY AS NEEDED FOR DIZZINESS 30 tablet 3   montelukast (SINGULAIR) 10 MG tablet TAKE ONE TABLET BY MOUTH EVERY DAY 90 tablet 1   ondansetron (ZOFRAN) 4 MG tablet TAKE ONE TABLET BY MOUTH EVERY 8 HOURS AS NEEDED FOR NAUSEA/VOMITING 60 tablet 1   polyethylene glycol powder  (GLYCOLAX/MIRALAX) 17 GM/SCOOP powder MIX 17 GRAMS (1 CAPFUL) AS DIRECTED AND TAKE 2 TIMES A DAY 510 g 2   propranolol (INDERAL) 60 MG tablet TAKE 1 TABLET BY MOUTH TWICE  DAILY 200 tablet 2   SPIRIVA RESPIMAT 2.5 MCG/ACT AERS INHALE 2 PUFFS BY MOUTH EVERY DAY 4 g 2   tamsulosin (FLOMAX) 0.4 MG CAPS capsule TAKE 2 CAPSULES BY MOUTH DAILY  AFTER SUPPER 200 capsule 2   traZODone (DESYREL) 50 MG tablet TAKE 1/2-1 TABLET AT BEDTIME AS NEEDED FOR SLEEP 90 tablet 3   valsartan (DIOVAN) 80 MG tablet TAKE ONE TABLET BY MOUTH EVERY DAY 30 tablet 0   varenicline (CHANTIX) 1 MG tablet TAKE 1 TABLET BY MOUTH TWICE A DAY 168 tablet 1   No current facility-administered medications on file prior to visit.   Past Medical History:  Diagnosis Date   Apnea    Arthritis    Arthritis    spinal arthritis   Cancer (HCC)    melanoma, basal cell   Centrilobular emphysema (HCC) 07/08/2015   Depression    Emphysema of lung (HCC)    COPD   GERD (gastroesophageal reflux disease)    Hypercholesterolemia    Hypertension    Impaired fasting glucose    Kyphosis    mild   Migraine    Neuropathy    Osteoarthritis    Other testicular dysfunction    Scoliosis    Sleep apnea    Solitary pulmonary nodule    Tinnitus    Past Surgical History:  Procedure Laterality Date   COLONOSCOPY  2016   Colon polyps. Tubular Adenoma.   TONSILLECTOMY      Family History  Problem Relation Age of Onset   Heart disease Father    Stroke Father    Breast cancer Maternal Grandmother    Leukemia Mother    Skin cancer Mother    Diabetes Mother    Stroke Mother    Hypertension Mother    Skin cancer Brother    Cancer Brother        Melanoma   Hypertension Brother    Colon cancer Neg Hx    Social History   Socioeconomic History   Marital status: Married    Spouse name: Dondra Spry   Number of children: 2   Years of education: 12   Highest education level: Not on file  Occupational History   Occupation: disabled  Tobacco  Use   Smoking status: Every Day    Current packs/day: 0.50    Average packs/day: 0.5 packs/day for 50.8 years (25.4 ttl pk-yrs)    Types: Cigarettes  Start date: 1974   Smokeless tobacco: Never   Tobacco comments:    currently smoking 3/4 ppd. For years smoked 1 1/2 ppd   Vaping Use   Vaping status: Never Used  Substance and Sexual Activity   Alcohol use: No    Alcohol/week: 0.0 standard drinks of alcohol   Drug use: No   Sexual activity: Not on file  Other Topics Concern   Not on file  Social History Narrative   Lives with wife   Caffeine- coffee, 3 cups daily   Social Determinants of Health   Financial Resource Strain: Low Risk  (02/08/2022)   Overall Financial Resource Strain (CARDIA)    Difficulty of Paying Living Expenses: Not hard at all  Food Insecurity: No Food Insecurity (02/08/2022)   Hunger Vital Sign    Worried About Running Out of Food in the Last Year: Never true    Ran Out of Food in the Last Year: Never true  Transportation Needs: No Transportation Needs (08/31/2022)   PRAPARE - Administrator, Civil Service (Medical): No    Lack of Transportation (Non-Medical): No  Physical Activity: Inactive (08/31/2022)   Exercise Vital Sign    Days of Exercise per Week: 0 days    Minutes of Exercise per Session: 0 min  Stress: No Stress Concern Present (11/10/2022)   Received from [facility] of Occupational Health - Occupational Stress Questionnaire    Feeling of Stress : Only a little  Social Connections: Socially Integrated (11/18/2022)   Social Connection and Isolation Panel [NHANES]    Frequency of Communication with Friends and Family: More than three times a week    Frequency of Social Gatherings with Friends and Family: Once a week    Attends Religious Services: More than 4 times per year    Active Member of [employer] or Organizations: Yes    Attends Engineer, structural: More than 4 times per year    Marital Status:  Married    Objective:  BP 130/88   Pulse 86   Temp (!) 97.5 F (36.4 C)   Ht 5\' 10"  (1.778 m)   Wt 176 lb (79.8 kg)   SpO2 97%   BMI 25.25 kg/m      12/17/2022    9:18 AM 11/18/2022   10:09 AM 11/04/2022    8:37 AM  BP/Weight  Systolic BP 130 138 150  Diastolic BP 88 88 80  Wt. (Lbs) 176 176   BMI 25.25 kg/m2 25.25 kg/m2     Physical Exam Vitals reviewed.  Constitutional:      Appearance: Normal appearance.  Neck:     Vascular: No carotid bruit.  Cardiovascular:     Rate and Rhythm: Normal rate and regular rhythm.     Heart sounds: Normal heart sounds.  Pulmonary:     Effort: Pulmonary effort is normal.     Breath sounds: Rales (rt lung.) present. No wheezing or rhonchi.  Abdominal:     General: Bowel sounds are normal.     Palpations: Abdomen is soft.     Tenderness: There is no abdominal tenderness.  Neurological:     Mental Status: He is alert and oriented to person, place, and time.  Psychiatric:        Mood and Affect: Mood normal.        Behavior: Behavior normal.     Diabetic Foot Exam - Simple   No data filed  Lab Results  Component Value Date   WBC 7.5 12/17/2022   HGB 14.6 12/17/2022   HCT 43.3 12/17/2022   PLT 213 12/17/2022   GLUCOSE 86 12/17/2022   CHOL 238 (H) 12/17/2022   TRIG 98 12/17/2022   HDL 54 12/17/2022   LDLCALC 167 (H) 12/17/2022   ALT 9 12/17/2022   AST 7 12/17/2022   NA 143 12/17/2022   K 4.8 12/17/2022   CL 102 12/17/2022   CREATININE 0.96 12/17/2022   BUN 11 12/17/2022   CO2 29 12/17/2022   TSH 2.550 02/04/2022   INR 1.0 01/27/2021   HGBA1C 5.9 (H) 12/17/2022      Assessment & Plan:    Essential hypertension, benign Assessment & Plan: Well controlled Propranolol 60 mg by mouth TWICE A DAY and Valsartan 80 mg by mouth once daily. Continue to work on eating a healthy diet and exercise.   Orders: -     CBC with Differential/Platelet -     Comprehensive metabolic panel  Mixed  hyperlipidemia Assessment & Plan: Poorly controlled. Recommend start on zetia 10 mg before bed.  Continue to work on eating a healthy diet and exercise.  Labs drawn today.    Orders: -     Lipid panel  Prediabetes Assessment & Plan: Recommend continue to work on eating healthy diet and exercise.   Orders: -     Hemoglobin A1c  Gastroesophageal reflux disease without esophagitis Assessment & Plan: The current medical regimen is effective;  continue present plan and medications. Continue otc zantac daily.     Pneumonia of right lower lobe due to infectious organism Assessment & Plan: Order Chest Xray Sent Levaquin  Orders: -     DG Chest 2 View; Future -     levoFLOXacin; Take 1 tablet (750 mg total) by mouth daily.  Dispense: 7 tablet; Refill: 0     Meds ordered this encounter  Medications   levofloxacin (LEVAQUIN) 750 MG tablet    Sig: Take 1 tablet (750 mg total) by mouth daily.    Dispense:  7 tablet    Refill:  0    Orders Placed This Encounter  Procedures   DG Chest 2 View   CBC with Differential/Platelet   Comprehensive metabolic panel   Hemoglobin A1c   Lipid panel     Follow-up: Return in about 3 months (around 03/19/2023) for chronic follow up.   I,Marla I Leal-Borjas,acting as a scribe for Blane Ohara, MD.,have documented all relevant documentation on the behalf of Blane Ohara, MD,as directed by  Blane Ohara, MD while in the presence of Blane Ohara, MD.   An After Visit Summary was printed and given to the patient.  I attest that I have reviewed this visit and agree with the plan scribed by my staff.   Blane Ohara, MD [facility] [phone]

## 2022-12-16 NOTE — Assessment & Plan Note (Signed)
Well controlled Propranolol 60 mg by mouth TWICE A DAY and Valsartan 80 mg by mouth once daily. Continue to work on eating a healthy diet and exercise.

## 2022-12-17 ENCOUNTER — Encounter: Payer: Self-pay | Admitting: Family Medicine

## 2022-12-17 ENCOUNTER — Ambulatory Visit (INDEPENDENT_AMBULATORY_CARE_PROVIDER_SITE_OTHER): Payer: Medicare Other | Admitting: Family Medicine

## 2022-12-17 VITALS — BP 130/88 | HR 86 | Temp 97.5°F | Ht 70.0 in | Wt 176.0 lb

## 2022-12-17 DIAGNOSIS — E782 Mixed hyperlipidemia: Secondary | ICD-10-CM

## 2022-12-17 DIAGNOSIS — R7303 Prediabetes: Secondary | ICD-10-CM

## 2022-12-17 DIAGNOSIS — K219 Gastro-esophageal reflux disease without esophagitis: Secondary | ICD-10-CM | POA: Diagnosis not present

## 2022-12-17 DIAGNOSIS — I10 Essential (primary) hypertension: Secondary | ICD-10-CM

## 2022-12-17 DIAGNOSIS — J189 Pneumonia, unspecified organism: Secondary | ICD-10-CM

## 2022-12-17 LAB — LIPID PANEL
Chol/HDL Ratio: 4.4 {ratio} (ref 0.0–5.0)
Cholesterol, Total: 238 mg/dL — ABNORMAL HIGH (ref 100–199)
HDL: 54 mg/dL (ref >39)
LDL Chol Calc (NIH): 167 mg/dL — ABNORMAL HIGH (ref 0–99)
Triglycerides: 98 mg/dL (ref 0–149)
VLDL Cholesterol Cal: 17 mg/dL (ref 5–40)

## 2022-12-17 LAB — COMPREHENSIVE METABOLIC PANEL
ALT: 9 [IU]/L (ref 0–44)
AST: 7 [IU]/L (ref 0–40)
Albumin: 4.3 g/dL (ref 3.9–4.9)
Alkaline Phosphatase: 109 [IU]/L (ref 44–121)
BUN/Creatinine Ratio: 11 (ref 10–24)
BUN: 11 mg/dL (ref 8–27)
Bilirubin Total: 0.3 mg/dL (ref 0.0–1.2)
CO2: 29 mmol/L (ref 20–29)
Calcium: 9.1 mg/dL (ref 8.6–10.2)
Chloride: 102 mmol/L (ref 96–106)
Creatinine, Ser: 0.96 mg/dL (ref 0.76–1.27)
Globulin, Total: 2.2 g/dL (ref 1.5–4.5)
Glucose: 86 mg/dL (ref 70–99)
Potassium: 4.8 mmol/L (ref 3.5–5.2)
Sodium: 143 mmol/L (ref 134–144)
Total Protein: 6.5 g/dL (ref 6.0–8.5)
eGFR: 88 mL/min/{1.73_m2} (ref >59)

## 2022-12-17 LAB — CBC WITH DIFFERENTIAL/PLATELET
Basophils Absolute: 0.1 10*3/uL (ref 0.0–0.2)
Basos: 1 %
EOS (ABSOLUTE): 0.5 10*3/uL — ABNORMAL HIGH (ref 0.0–0.4)
Eos: 7 %
Hematocrit: 43.3 % (ref 37.5–51.0)
Hemoglobin: 14.6 g/dL (ref 13.0–17.7)
Immature Grans (Abs): 0 10*3/uL (ref 0.0–0.1)
Immature Granulocytes: 0 %
Lymphocytes Absolute: 2.2 10*3/uL (ref 0.7–3.1)
Lymphs: 29 %
MCH: 30.3 pg (ref 26.6–33.0)
MCHC: 33.7 g/dL (ref 31.5–35.7)
MCV: 90 fL (ref 79–97)
Monocytes Absolute: 0.6 10*3/uL (ref 0.1–0.9)
Monocytes: 8 %
Neutrophils Absolute: 4.2 10*3/uL (ref 1.4–7.0)
Neutrophils: 55 %
Platelets: 213 10*3/uL (ref 150–450)
RBC: 4.82 x10E6/uL (ref 4.14–5.80)
RDW: 12.8 % (ref 11.6–15.4)
WBC: 7.5 10*3/uL (ref 3.4–10.8)

## 2022-12-17 LAB — HEMOGLOBIN A1C
Est. average glucose Bld gHb Est-mCnc: 123 mg/dL
Hgb A1c MFr Bld: 5.9 % — ABNORMAL HIGH (ref 4.8–5.6)

## 2022-12-17 MED ORDER — LEVOFLOXACIN 750 MG PO TABS
750.0000 mg | ORAL_TABLET | Freq: Every day | ORAL | 0 refills | Status: AC
Start: 2022-12-17 — End: ?

## 2022-12-18 DIAGNOSIS — J189 Pneumonia, unspecified organism: Secondary | ICD-10-CM | POA: Insufficient documentation

## 2022-12-18 HISTORY — DX: Pneumonia, unspecified organism: J18.9

## 2022-12-19 NOTE — Assessment & Plan Note (Signed)
Order Chest Xray Sent Levaquin

## 2022-12-20 ENCOUNTER — Other Ambulatory Visit: Payer: Self-pay | Admitting: Family Medicine

## 2022-12-20 ENCOUNTER — Encounter: Payer: Self-pay | Admitting: Family Medicine

## 2022-12-22 ENCOUNTER — Encounter: Payer: Self-pay | Admitting: Family Medicine

## 2022-12-22 ENCOUNTER — Telehealth: Payer: Self-pay

## 2022-12-22 NOTE — Telephone Encounter (Signed)
Patient's wife called in concern because the my chart message she received from you was that the patient has pneumonia, and the chest xray reads Lungs clear and no consolidation. Please advise. I told the patient's wife I believe you had meant to say that no pneumonia was seen. Please advise.

## 2022-12-27 DIAGNOSIS — H903 Sensorineural hearing loss, bilateral: Secondary | ICD-10-CM | POA: Diagnosis not present

## 2022-12-27 DIAGNOSIS — H9313 Tinnitus, bilateral: Secondary | ICD-10-CM | POA: Diagnosis not present

## 2023-01-05 ENCOUNTER — Ambulatory Visit: Payer: Medicare Other

## 2023-01-06 ENCOUNTER — Other Ambulatory Visit: Payer: Self-pay | Admitting: Family Medicine

## 2023-01-07 ENCOUNTER — Other Ambulatory Visit: Payer: Self-pay | Admitting: Family Medicine

## 2023-01-07 DIAGNOSIS — I10 Essential (primary) hypertension: Secondary | ICD-10-CM

## 2023-01-20 ENCOUNTER — Other Ambulatory Visit: Payer: Self-pay

## 2023-01-20 ENCOUNTER — Other Ambulatory Visit: Payer: Self-pay | Admitting: Family Medicine

## 2023-01-20 DIAGNOSIS — F17219 Nicotine dependence, cigarettes, with unspecified nicotine-induced disorders: Secondary | ICD-10-CM

## 2023-01-20 DIAGNOSIS — J301 Allergic rhinitis due to pollen: Secondary | ICD-10-CM

## 2023-01-20 MED ORDER — VARENICLINE TARTRATE 1 MG PO TABS
1.0000 mg | ORAL_TABLET | Freq: Two times a day (BID) | ORAL | 1 refills | Status: AC
Start: 2023-01-20 — End: ?

## 2023-01-24 ENCOUNTER — Other Ambulatory Visit: Payer: Self-pay | Admitting: Family Medicine

## 2023-02-01 ENCOUNTER — Other Ambulatory Visit: Payer: Self-pay | Admitting: Family Medicine

## 2023-02-01 DIAGNOSIS — J449 Chronic obstructive pulmonary disease, unspecified: Secondary | ICD-10-CM

## 2023-02-09 DIAGNOSIS — Z79899 Other long term (current) drug therapy: Secondary | ICD-10-CM | POA: Diagnosis not present

## 2023-02-17 ENCOUNTER — Other Ambulatory Visit: Payer: Self-pay | Admitting: Family Medicine

## 2023-03-09 DIAGNOSIS — L299 Pruritus, unspecified: Secondary | ICD-10-CM | POA: Diagnosis not present

## 2023-03-09 DIAGNOSIS — L821 Other seborrheic keratosis: Secondary | ICD-10-CM | POA: Diagnosis not present

## 2023-03-09 DIAGNOSIS — D225 Melanocytic nevi of trunk: Secondary | ICD-10-CM | POA: Diagnosis not present

## 2023-03-09 DIAGNOSIS — D485 Neoplasm of uncertain behavior of skin: Secondary | ICD-10-CM | POA: Diagnosis not present

## 2023-03-21 DIAGNOSIS — F17210 Nicotine dependence, cigarettes, uncomplicated: Secondary | ICD-10-CM | POA: Diagnosis not present

## 2023-03-21 DIAGNOSIS — R1111 Vomiting without nausea: Secondary | ICD-10-CM | POA: Diagnosis not present

## 2023-03-21 DIAGNOSIS — J441 Chronic obstructive pulmonary disease with (acute) exacerbation: Secondary | ICD-10-CM | POA: Diagnosis not present

## 2023-03-21 DIAGNOSIS — K219 Gastro-esophageal reflux disease without esophagitis: Secondary | ICD-10-CM | POA: Diagnosis not present

## 2023-03-21 DIAGNOSIS — G4733 Obstructive sleep apnea (adult) (pediatric): Secondary | ICD-10-CM | POA: Diagnosis not present

## 2023-03-21 DIAGNOSIS — G2581 Restless legs syndrome: Secondary | ICD-10-CM | POA: Diagnosis not present

## 2023-03-21 DIAGNOSIS — Z1152 Encounter for screening for COVID-19: Secondary | ICD-10-CM | POA: Diagnosis not present

## 2023-03-21 DIAGNOSIS — G8929 Other chronic pain: Secondary | ICD-10-CM | POA: Diagnosis not present

## 2023-03-21 DIAGNOSIS — I10 Essential (primary) hypertension: Secondary | ICD-10-CM | POA: Diagnosis not present

## 2023-03-21 DIAGNOSIS — G4700 Insomnia, unspecified: Secondary | ICD-10-CM | POA: Diagnosis not present

## 2023-03-21 DIAGNOSIS — R000 Tachycardia, unspecified: Secondary | ICD-10-CM | POA: Diagnosis not present

## 2023-03-21 DIAGNOSIS — R509 Fever, unspecified: Secondary | ICD-10-CM | POA: Diagnosis not present

## 2023-03-21 DIAGNOSIS — R0902 Hypoxemia: Secondary | ICD-10-CM | POA: Diagnosis not present

## 2023-03-21 DIAGNOSIS — Z880 Allergy status to penicillin: Secondary | ICD-10-CM | POA: Diagnosis not present

## 2023-03-21 DIAGNOSIS — R531 Weakness: Secondary | ICD-10-CM | POA: Diagnosis not present

## 2023-03-22 DIAGNOSIS — R0902 Hypoxemia: Secondary | ICD-10-CM | POA: Diagnosis not present

## 2023-03-23 DIAGNOSIS — F17210 Nicotine dependence, cigarettes, uncomplicated: Secondary | ICD-10-CM | POA: Diagnosis not present

## 2023-03-23 DIAGNOSIS — J441 Chronic obstructive pulmonary disease with (acute) exacerbation: Secondary | ICD-10-CM | POA: Diagnosis not present

## 2023-03-23 DIAGNOSIS — R0902 Hypoxemia: Secondary | ICD-10-CM | POA: Diagnosis not present

## 2023-03-23 DIAGNOSIS — I10 Essential (primary) hypertension: Secondary | ICD-10-CM | POA: Diagnosis not present

## 2023-03-23 DIAGNOSIS — G2581 Restless legs syndrome: Secondary | ICD-10-CM | POA: Diagnosis not present

## 2023-03-23 DIAGNOSIS — Z1152 Encounter for screening for COVID-19: Secondary | ICD-10-CM | POA: Diagnosis not present

## 2023-03-23 DIAGNOSIS — G4700 Insomnia, unspecified: Secondary | ICD-10-CM | POA: Diagnosis not present

## 2023-03-23 DIAGNOSIS — Z880 Allergy status to penicillin: Secondary | ICD-10-CM | POA: Diagnosis not present

## 2023-03-23 DIAGNOSIS — F17200 Nicotine dependence, unspecified, uncomplicated: Secondary | ICD-10-CM | POA: Diagnosis not present

## 2023-03-23 DIAGNOSIS — K219 Gastro-esophageal reflux disease without esophagitis: Secondary | ICD-10-CM | POA: Diagnosis not present

## 2023-03-23 DIAGNOSIS — Z79899 Other long term (current) drug therapy: Secondary | ICD-10-CM | POA: Diagnosis not present

## 2023-03-23 DIAGNOSIS — R7303 Prediabetes: Secondary | ICD-10-CM | POA: Diagnosis not present

## 2023-03-23 DIAGNOSIS — H903 Sensorineural hearing loss, bilateral: Secondary | ICD-10-CM | POA: Diagnosis not present

## 2023-03-23 DIAGNOSIS — G4733 Obstructive sleep apnea (adult) (pediatric): Secondary | ICD-10-CM | POA: Diagnosis not present

## 2023-03-23 DIAGNOSIS — G8929 Other chronic pain: Secondary | ICD-10-CM | POA: Diagnosis not present

## 2023-03-31 DIAGNOSIS — R112 Nausea with vomiting, unspecified: Secondary | ICD-10-CM | POA: Diagnosis not present

## 2023-04-04 ENCOUNTER — Other Ambulatory Visit: Payer: Self-pay | Admitting: Family Medicine

## 2023-04-06 DIAGNOSIS — Z79899 Other long term (current) drug therapy: Secondary | ICD-10-CM | POA: Diagnosis not present

## 2023-04-07 DIAGNOSIS — C44519 Basal cell carcinoma of skin of other part of trunk: Secondary | ICD-10-CM | POA: Diagnosis not present

## 2023-04-17 DIAGNOSIS — J441 Chronic obstructive pulmonary disease with (acute) exacerbation: Secondary | ICD-10-CM | POA: Diagnosis not present

## 2023-05-09 ENCOUNTER — Other Ambulatory Visit: Payer: Self-pay | Admitting: Family Medicine

## 2023-05-12 ENCOUNTER — Other Ambulatory Visit: Payer: Self-pay | Admitting: Family Medicine

## 2023-05-12 DIAGNOSIS — I10 Essential (primary) hypertension: Secondary | ICD-10-CM

## 2023-06-01 DIAGNOSIS — Z79899 Other long term (current) drug therapy: Secondary | ICD-10-CM | POA: Diagnosis not present

## 2023-06-13 ENCOUNTER — Other Ambulatory Visit: Payer: Self-pay | Admitting: Family Medicine

## 2023-06-13 NOTE — Telephone Encounter (Signed)
 Copied from CRM [id number]. Topic: Clinical - Medication Refill >> Jun 13, 2023  4:50 PM Hobson Luna F wrote: Most Recent Primary Care Visit:  Provider: Reinhold Carbine, KIRSTEN  Department: [facility]  Visit Type: OFFICE VISIT  Date: 12/17/2022  Medication: gabapentin (NEURONTIN) 600 MG tablet [[id number]]  Has the patient contacted their pharmacy? Yes  (Agent: If yes, when and what did the pharmacy advise?) contact office   Is this the correct pharmacy for this prescription? Yes  This is the patient's preferred pharmacy:  [address] [place], [place] - [address] [place] [address] Phone: [phone] Fax: [phone]   Has the prescription been filled recently? Yes  Is the patient out of the medication? Yes  Has the patient been seen for an appointment in the last year OR does the patient have an upcoming appointment? Yes  Can we respond through MyChart? Yes  Agent: Please be advised that Rx refills may take up to 3 business days. We ask that you follow-up with your pharmacy.

## 2023-07-13 ENCOUNTER — Other Ambulatory Visit: Payer: Self-pay | Admitting: Family Medicine

## 2023-07-20 DIAGNOSIS — Z79899 Other long term (current) drug therapy: Secondary | ICD-10-CM | POA: Diagnosis not present

## 2023-07-26 ENCOUNTER — Other Ambulatory Visit: Payer: Self-pay

## 2023-07-26 ENCOUNTER — Other Ambulatory Visit: Payer: Self-pay | Admitting: Family Medicine

## 2023-07-26 DIAGNOSIS — J301 Allergic rhinitis due to pollen: Secondary | ICD-10-CM

## 2023-08-11 ENCOUNTER — Other Ambulatory Visit: Payer: Self-pay | Admitting: Family Medicine

## 2023-08-11 DIAGNOSIS — I10 Essential (primary) hypertension: Secondary | ICD-10-CM

## 2023-08-16 ENCOUNTER — Ambulatory Visit: Admitting: Family Medicine

## 2023-08-16 ENCOUNTER — Encounter: Payer: Self-pay | Admitting: Family Medicine

## 2023-08-16 VITALS — BP 110/64 | HR 97 | Temp 98.2°F | Ht 70.0 in | Wt 178.0 lb

## 2023-08-16 DIAGNOSIS — J432 Centrilobular emphysema: Secondary | ICD-10-CM | POA: Diagnosis not present

## 2023-08-16 DIAGNOSIS — R7303 Prediabetes: Secondary | ICD-10-CM

## 2023-08-16 DIAGNOSIS — K219 Gastro-esophageal reflux disease without esophagitis: Secondary | ICD-10-CM

## 2023-08-16 DIAGNOSIS — E782 Mixed hyperlipidemia: Secondary | ICD-10-CM | POA: Diagnosis not present

## 2023-08-16 DIAGNOSIS — N3941 Urge incontinence: Secondary | ICD-10-CM

## 2023-08-16 DIAGNOSIS — G4733 Obstructive sleep apnea (adult) (pediatric): Secondary | ICD-10-CM

## 2023-08-16 DIAGNOSIS — N400 Enlarged prostate without lower urinary tract symptoms: Secondary | ICD-10-CM

## 2023-08-16 DIAGNOSIS — R350 Frequency of micturition: Secondary | ICD-10-CM

## 2023-08-16 DIAGNOSIS — I10 Essential (primary) hypertension: Secondary | ICD-10-CM

## 2023-08-16 LAB — POCT URINALYSIS DIP (CLINITEK)
Blood, UA: NEGATIVE
Glucose, UA: NEGATIVE mg/dL
Ketones, POC UA: NEGATIVE mg/dL
Leukocytes, UA: NEGATIVE
Nitrite, UA: NEGATIVE
POC PROTEIN,UA: NEGATIVE
Spec Grav, UA: >=1.03 — AB (ref 1.010–1.025)
Urobilinogen, UA: 0.2 U/dL
pH, UA: 6 (ref 5.0–8.0)

## 2023-08-16 MED ORDER — OXYBUTYNIN CHLORIDE ER 5 MG PO TB24: 5.0000 mg | ORAL_TABLET | Freq: Every day | ORAL | 2 refills | Status: DC

## 2023-08-16 NOTE — Patient Instructions (Signed)
 Start on ditropan xl 5 mg daily.  Refer to urology.

## 2023-08-16 NOTE — Progress Notes (Unsigned)
 Subjective:  Patient ID: Jacob Graves, male    DOB: 1955-07-14  Age: 68 y.o. MRN: [id number]  Chief Complaint  Patient presents with   Medical Management of Chronic Issues    HPI: OSA: not wearing CPAP because unable to tolerate it.    COPD: Not using Spiriva  regularly. Uses occasional proair . Breathing is good with out inhalers.    Hyperlipidemia: d/c atorvastatin  80 mg before bed due to it being linked to dementia.   Allergies: allegra , flonase .    Tobacco Use: Stopped. Quit in January when was sick from the flu. He lost control of his bowels and his oxygen dropped and they had to call the EMS. Took him to [facility] and was diagnosed with flu.   Hypertension: d/c propranolol  60 mg twice daily. BP doing well.    GERD: takes omeprazole  40 mg daily.   Chronic back pain: no longer on narcotics. In suboxone program with [facility]MA Dr. Elaine. Will have pain in back that causes his legs to go numb and weak at times. This has been going on for 2 years. Patient was seeing [facility], but since off narcotics has not returned. ON gabapentin  and ibuprofen .  Using Sentiva thc gummie bears also.    Constipation: Miracle Moo - bovine colostrum which has helped.   Urgency and enuresis (without any premonition.) Patient held tamsulosin  and it seemed to help. Symptoms have been going on for a couple of months. No dysuria. No hematuria.   Taking isopure protein supplement in a smoothie.      10/28/2022    8:43 AM 08/31/2022    9:17 AM 02/08/2022    2:01 PM 08/19/2021   11:03 AM 10/22/2020    4:21 PM  Depression screen PHQ 2/9  Decreased Interest 0 3 0 0 1  Down, Depressed, Hopeless 0 0 0 0 0  PHQ - 2 Score 0 3 0 0 1  Altered sleeping 2 3   2   Tired, decreased energy 3 3   2   Change in appetite 3 0   1  Feeling bad or failure about yourself  0 0   0  Trouble concentrating 2 3   1   Moving slowly or fidgety/restless 0 0   0  Suicidal thoughts 0 0   0  PHQ-9  Score 10 12   7   Difficult doing work/chores Somewhat difficult Very difficult   Somewhat difficult        10/28/2022    8:43 AM  Fall Risk   Falls in the past year? 1  Number falls in past yr: 0  Injury with Fall? 0  Risk for fall due to : Impaired balance/gait  Follow up Falls evaluation completed;Falls prevention discussed    Patient Care Team: Sherre Clapper, MD as PCP - General (Family Medicine) Trudy Lynwood DASEN, MD as Referring Physician (Dermatology) Geronimo Amel, MD as Consulting Physician (Pulmonary Disease) Delores Lauraine NOVAK, [facility] (Inactive) as Pharmacist (Pharmacist)   Review of Systems  Constitutional:  Negative for chills, diaphoresis, fatigue and fever.  HENT:  Negative for congestion, ear pain and sore throat.   Respiratory:  Negative for cough and shortness of breath.   Cardiovascular:  Positive for chest pain (cramps in chest lasts about 30 minutes.). Negative for leg swelling.  Gastrointestinal:  Negative for abdominal pain, constipation, diarrhea, nausea and vomiting.  Genitourinary:  Positive for difficulty urinating, enuresis and urgency. Negative for dysuria.       Night time urinary  incon.  Musculoskeletal:  Negative for arthralgias and myalgias.  Neurological:  Negative for dizziness and headaches.  Psychiatric/Behavioral:  Negative for dysphoric mood. The patient is not nervous/anxious.     Current Outpatient Medications on File Prior to Visit  Medication Sig Dispense Refill   Buprenorphine HCl-Naloxone HCl 8-2 MG FILM Place under the tongue every other day. (Patient taking differently: Place under the tongue every other day. Taking 1/2 tablet every day)     GINSENG PO Take by mouth.     MAGNESIUM ASPARTATE PO Take by mouth.     NON FORMULARY Miracle Moo     albuterol  (VENTOLIN  HFA) 108 (90 Base) MCG/ACT inhaler INHALE 2 PUFFS BY MOUTH EVERY 6 HOURS ASNEEDED FOR SHORT OF BREATH OR WHEEZING 8.5 g 1   Aspirin-Acetaminophen-Caffeine 500-325-65 MG PACK Take  325 mg by mouth 2 (two) times daily as needed.     fexofenadine  (ALLEGRA ) 180 MG tablet Take 1 tablet (180 mg total) by mouth daily. 90 tablet 3   fluticasone  (FLONASE ) 50 MCG/ACT nasal spray INHALE 1 SPRAY IN EACH NOSTRIL EVERY DAY 16 g 2   gabapentin  (NEURONTIN ) 600 MG tablet TAKE ONE TABLET BY MOUTH EVERY MORNING AT NOON  IN THE EVENING  AND AT BEDTIME 360 tablet 3   ibuprofen  (ADVIL ) 800 MG tablet TAKE ONE TABLET BY MOUTH 3 TIMES A DAY 270 tablet 1   montelukast  (SINGULAIR ) 10 MG tablet TAKE ONE TABLET BY MOUTH EVERY DAY 90 tablet 1   omeprazole  (PRILOSEC) 40 MG capsule TAKE 1 CAPSULE BY MOUTH DAILY  BEFORE A MEAL 100 capsule 2   ondansetron  (ZOFRAN ) 4 MG tablet TAKE ONE TABLET BY MOUTH EVERY 8 HOURS AS NEEDED FOR NAUSEA/VOMITING 60 tablet 1   raNITIdine HCl (ZANTAC PO) Take by mouth.     SPIRIVA  RESPIMAT 2.5 MCG/ACT AERS INHALE 2 PUFFS BY MOUTH EVERY DAY 4 g 2   traZODone  (DESYREL ) 50 MG tablet TAKE 1/2-1 TABLET AT BEDTIME AS NEEDED FOR SLEEP 90 tablet 1   valsartan  (DIOVAN ) 80 MG tablet TAKE ONE TABLET BY MOUTH EVERY DAY 30 tablet 2   No current facility-administered medications on file prior to visit.   Past Medical History:  Diagnosis Date   Apnea    Arthritis    Arthritis    spinal arthritis   Cancer (HCC)    melanoma, basal cell   Centrilobular emphysema (HCC) 07/08/2015   Depression    Emphysema of lung (HCC)    COPD   GERD (gastroesophageal reflux disease)    Hypercholesterolemia    Hypertension    Impaired fasting glucose    Kyphosis    mild   Migraine    Neuropathy    Osteoarthritis    Other testicular dysfunction    Scoliosis    Sleep apnea    Solitary pulmonary nodule    Tinnitus    Past Surgical History:  Procedure Laterality Date   COLONOSCOPY  2016   Colon polyps. Tubular Adenoma.   TONSILLECTOMY      Family History  Problem Relation Age of Onset   Heart disease Father    Stroke Father    Breast cancer Maternal Grandmother    Leukemia Mother     Skin cancer Mother    Diabetes Mother    Stroke Mother    Hypertension Mother    Skin cancer Brother    Cancer Brother        Melanoma   Hypertension Brother    Colon cancer Neg Hx  Social History   Socioeconomic History   Marital status: Married    Spouse name: Alisa   Number of children: 2   Years of education: 12   Highest education level: Not on file  Occupational History   Occupation: disabled  Tobacco Use   Smoking status: Former    Average packs/day: 0.5 packs/day for 51.1 years (25.5 ttl pk-yrs)    Types: Cigarettes    Start date: 1974   Smokeless tobacco: Never   Tobacco comments:    Stopped smoking 03/2023    For years smoked 1 1/2 ppd   Vaping Use   Vaping status: Never Used  Substance and Sexual Activity   Alcohol use: No    Alcohol/week: 0.0 standard drinks of alcohol   Drug use: No   Sexual activity: Not Currently  Other Topics Concern   Not on file  Social History Narrative   Lives with wife   Caffeine- coffee, 3 cups daily   Social Drivers of Health   Financial Resource Strain: Low Risk  (03/22/2023)   Received [facility]   Overall Financial Resource Strain (CARDIA)    Difficulty of Paying Living Expenses: Not hard at all  Food Insecurity: No Food Insecurity (03/22/2023)   Received from [facility]   Hunger Vital Sign    Within the past 12 months, you worried that your food would run out before you got the money to buy more.: Never true    Within the past 12 months, the food you bought just didn't last and you didn't have money to get more.: Never true  Transportation Needs: No Transportation Needs (03/22/2023)   Received from [facility]   PRAPARE - Transportation    Lack of Transportation (Medical): No    Lack of Transportation (Non-Medical): No  Physical Activity: Inactive (08/31/2022)   Exercise Vital Sign    Days of Exercise per Week: 0 days    Minutes of Exercise per Session: 0 min  Stress: No Stress Concern Present  (11/10/2022)   Received from [facility] of Occupational Health - Occupational Stress Questionnaire    Feeling of Stress : Only a little  Social Connections: Socially Integrated (11/18/2022)   Social Connection and Isolation Panel    Frequency of Communication with Friends and Family: More than three times a week    Frequency of Social Gatherings with Friends and Family: Once a week    Attends Religious Services: More than 4 times per year    Active Member of [employer] or Organizations: Yes    Attends Engineer, structural: More than 4 times per year    Marital Status: Married    Objective:  BP 110/64   Pulse 97   Temp 98.2 F (36.8 C)   Ht 5' 10 (1.778 m)   Wt 178 lb (80.7 kg)   SpO2 97%   BMI 25.54 kg/m      08/16/2023    2:17 PM 12/17/2022    9:18 AM 11/18/2022   10:09 AM  BP/Weight  Systolic BP 110 130 138  Diastolic BP 64 88 88  Wt. (Lbs) 178 176 176  BMI 25.54 kg/m2 25.25 kg/m2 25.25 kg/m2    Physical Exam Vitals reviewed.  Constitutional:      Appearance: Normal appearance. He is normal weight.  Neck:     Vascular: No carotid bruit.   Cardiovascular:     Rate and Rhythm: Normal rate and regular rhythm.  Pulses: Normal pulses.     Heart sounds: Normal heart sounds. No murmur heard. Pulmonary:     Effort: Pulmonary effort is normal.     Breath sounds: Normal breath sounds. No wheezing, rhonchi or rales.  Abdominal:     General: Abdomen is flat. Bowel sounds are normal.     Palpations: Abdomen is soft.     Tenderness: There is no abdominal tenderness.  Genitourinary:    Prostate: Enlarged. Not tender and no nodules present.     Rectum: External hemorrhoid present.   Neurological:     Mental Status: He is alert and oriented to person, place, and time.   Psychiatric:        Mood and Affect: Mood normal.        Behavior: Behavior normal.     {Perform Simple Foot Exam  Perform Detailed exam:1} {Insert foot Exam  (Optional):30965}   Lab Results  Component Value Date   WBC 7.0 08/16/2023   HGB 13.3 08/16/2023   HCT 41.1 08/16/2023   PLT 189 08/16/2023   GLUCOSE 106 (H) 08/16/2023   CHOL 252 (H) 08/16/2023   TRIG 219 (H) 08/16/2023   HDL 45 08/16/2023   LDLCALC 166 (H) 08/16/2023   ALT 9 08/16/2023   AST 8 08/16/2023   NA 143 08/16/2023   K 4.7 08/16/2023   CL 102 08/16/2023   CREATININE 0.89 08/16/2023   BUN 21 08/16/2023   CO2 26 08/16/2023   TSH 1.280 08/16/2023   INR 1.0 01/27/2021   HGBA1C 5.7 (H) 08/16/2023      Assessment & Plan:  Urinary frequency Assessment & Plan: Check UA Referring to urologist.  Orders: -     Ambulatory referral to Urology -     POCT URINALYSIS DIP (CLINITEK)  Essential hypertension, benign Assessment & Plan: Well controlled D/C Propranolol  60 mg by mouth TWICE A DAY. Continue Valsartan  80 mg by mouth once daily. Continue to work on eating a healthy diet and exercise. Labs drawn today.   Orders: -     Comprehensive metabolic panel with GFR -     TSH  Mixed hyperlipidemia Assessment & Plan: Poorly controlled. d/c atorvastatin  80 mg before bed due to it being linked to dementia.  Continue to work on eating a healthy diet and exercise.  Labs drawn today.    Orders: -     Lipid panel  Prediabetes Assessment & Plan: Recommend continue to work on eating healthy diet and exercise.   Orders: -     CBC with Differential/Platelet -     Hemoglobin A1c  Gastroesophageal reflux disease without esophagitis Assessment & Plan: The current medical regimen is effective;  continue present plan and medications. Continue taking omeprazole  40 mg daily.     Benign prostatic hyperplasia without lower urinary tract symptoms -     PSA  Centrilobular emphysema (HCC) Assessment & Plan: The current medical regimen is effective;  continue present plan and medications. Continue Spiriva , proair .    Urge incontinence Assessment & Plan: Start  on ditropan xl 5 mg daily.  Refer to urology.   Orders: -     oxyBUTYnin Chloride ER; Take 1 tablet (5 mg total) by mouth at bedtime.  Dispense: 30 tablet; Refill: 2     Meds ordered this encounter  Medications   oxybutynin (DITROPAN XL) 5 MG 24 hr tablet    Sig: Take 1 tablet (5 mg total) by mouth at bedtime.    Dispense:  30 tablet  Refill:  2    Orders Placed This Encounter  Procedures   CBC with Differential/Platelet   Comprehensive metabolic panel with GFR   Lipid panel   TSH   PSA   Hemoglobin A1c   Ambulatory referral to Urology   POCT URINALYSIS DIP (CLINITEK)     Follow-up: Return in about 3 months (around 11/16/2023) for chronic fasting.   I,Katherina A Bramblett,acting as a scribe for Abigail Free, MD.,have documented all relevant documentation on the behalf of Abigail Free, MD,as directed by  Abigail Free, MD while in the presence of Abigail Free, MD.    LILLETTE Kato I Leal-Borjas,acting as a scribe for Abigail Free, MD.,have documented all relevant documentation on the behalf of Abigail Free, MD,as directed by  Abigail Free, MD while in the presence of Abigail Free, MD.   An After Visit Summary was printed and given to the patient.  Abigail Free, MD [facility] [phone]

## 2023-08-17 LAB — LIPID PANEL
Chol/HDL Ratio: 5.6 ratio — ABNORMAL HIGH (ref 0.0–5.0)
Cholesterol, Total: 252 mg/dL — ABNORMAL HIGH (ref 100–199)
HDL: 45 mg/dL (ref >39)
LDL Chol Calc (NIH): 166 mg/dL — ABNORMAL HIGH (ref 0–99)
Triglycerides: 219 mg/dL — ABNORMAL HIGH (ref 0–149)
VLDL Cholesterol Cal: 41 mg/dL — ABNORMAL HIGH (ref 5–40)

## 2023-08-17 LAB — CBC WITH DIFFERENTIAL/PLATELET
Basophils Absolute: 0.1 10*3/uL (ref 0.0–0.2)
Basos: 1 %
EOS (ABSOLUTE): 0.3 10*3/uL (ref 0.0–0.4)
Eos: 4 %
Hematocrit: 41.1 % (ref 37.5–51.0)
Hemoglobin: 13.3 g/dL (ref 13.0–17.7)
Immature Grans (Abs): 0 10*3/uL (ref 0.0–0.1)
Immature Granulocytes: 0 %
Lymphocytes Absolute: 2.1 10*3/uL (ref 0.7–3.1)
Lymphs: 30 %
MCH: 29.7 pg (ref 26.6–33.0)
MCHC: 32.4 g/dL (ref 31.5–35.7)
MCV: 92 fL (ref 79–97)
Monocytes Absolute: 0.5 10*3/uL (ref 0.1–0.9)
Monocytes: 8 %
Neutrophils Absolute: 4 10*3/uL (ref 1.4–7.0)
Neutrophils: 57 %
Platelets: 189 10*3/uL (ref 150–450)
RBC: 4.48 x10E6/uL (ref 4.14–5.80)
RDW: 13 % (ref 11.6–15.4)
WBC: 7 10*3/uL (ref 3.4–10.8)

## 2023-08-17 LAB — COMPREHENSIVE METABOLIC PANEL WITH GFR
ALT: 9 IU/L (ref 0–44)
AST: 8 IU/L (ref 0–40)
Albumin: 4.2 g/dL (ref 3.9–4.9)
Alkaline Phosphatase: 90 IU/L (ref 44–121)
BUN/Creatinine Ratio: 24 (ref 10–24)
BUN: 21 mg/dL (ref 8–27)
Bilirubin Total: <0.2 mg/dL (ref 0.0–1.2)
CO2: 26 mmol/L (ref 20–29)
Calcium: 8.9 mg/dL (ref 8.6–10.2)
Chloride: 102 mmol/L (ref 96–106)
Creatinine, Ser: 0.89 mg/dL (ref 0.76–1.27)
Globulin, Total: 2.2 g/dL (ref 1.5–4.5)
Glucose: 106 mg/dL — ABNORMAL HIGH (ref 70–99)
Potassium: 4.7 mmol/L (ref 3.5–5.2)
Sodium: 143 mmol/L (ref 134–144)
Total Protein: 6.4 g/dL (ref 6.0–8.5)
eGFR: 95 mL/min/{1.73_m2} (ref >59)

## 2023-08-17 LAB — PSA: Prostate Specific Ag, Serum: 1.6 ng/mL (ref 0.0–4.0)

## 2023-08-17 LAB — HEMOGLOBIN A1C
Est. average glucose Bld gHb Est-mCnc: 117 mg/dL
Hgb A1c MFr Bld: 5.7 % — ABNORMAL HIGH (ref 4.8–5.6)

## 2023-08-17 LAB — TSH: TSH: 1.28 u[IU]/mL (ref 0.450–4.500)

## 2023-08-18 ENCOUNTER — Other Ambulatory Visit: Payer: Self-pay

## 2023-08-18 ENCOUNTER — Other Ambulatory Visit: Payer: Self-pay | Admitting: Family Medicine

## 2023-08-18 ENCOUNTER — Ambulatory Visit: Payer: Self-pay | Admitting: Family Medicine

## 2023-08-18 DIAGNOSIS — N400 Enlarged prostate without lower urinary tract symptoms: Secondary | ICD-10-CM

## 2023-08-18 DIAGNOSIS — N3941 Urge incontinence: Secondary | ICD-10-CM

## 2023-08-20 DIAGNOSIS — N3941 Urge incontinence: Secondary | ICD-10-CM | POA: Insufficient documentation

## 2023-08-20 DIAGNOSIS — R350 Frequency of micturition: Secondary | ICD-10-CM | POA: Insufficient documentation

## 2023-08-20 NOTE — Assessment & Plan Note (Signed)
 Start on ditropan xl 5 mg daily.  Refer to urology.

## 2023-08-20 NOTE — Assessment & Plan Note (Signed)
 Recommend continue to work on eating healthy diet and exercise.

## 2023-08-20 NOTE — Assessment & Plan Note (Signed)
 Poorly controlled. d/c atorvastatin  80 mg before bed due to it being linked to dementia.  Continue to work on eating a healthy diet and exercise.  Labs drawn today.

## 2023-08-20 NOTE — Assessment & Plan Note (Signed)
 Check UA Referring to urologist.

## 2023-08-20 NOTE — Assessment & Plan Note (Signed)
 The current medical regimen is effective;  continue present plan and medications. Continue taking omeprazole  40 mg daily.

## 2023-08-20 NOTE — Assessment & Plan Note (Signed)
The current medical regimen is effective;  continue present plan and medications. Continue Spiriva, proair.  

## 2023-08-20 NOTE — Assessment & Plan Note (Signed)
 Well controlled D/C Propranolol  60 mg by mouth TWICE A DAY. Continue Valsartan  80 mg by mouth once daily. Continue to work on eating a healthy diet and exercise. Labs drawn today.

## 2023-08-24 DIAGNOSIS — R3914 Feeling of incomplete bladder emptying: Secondary | ICD-10-CM | POA: Diagnosis not present

## 2023-08-24 DIAGNOSIS — R3915 Urgency of urination: Secondary | ICD-10-CM | POA: Diagnosis not present

## 2023-09-09 DIAGNOSIS — R3914 Feeling of incomplete bladder emptying: Secondary | ICD-10-CM | POA: Diagnosis not present

## 2023-09-15 ENCOUNTER — Other Ambulatory Visit: Payer: Self-pay | Admitting: Family Medicine

## 2023-09-27 ENCOUNTER — Ambulatory Visit: Admitting: Podiatry

## 2023-09-27 ENCOUNTER — Encounter: Payer: Self-pay | Admitting: Podiatry

## 2023-09-27 DIAGNOSIS — M2142 Flat foot [pes planus] (acquired), left foot: Secondary | ICD-10-CM

## 2023-09-27 DIAGNOSIS — M2041 Other hammer toe(s) (acquired), right foot: Secondary | ICD-10-CM | POA: Diagnosis not present

## 2023-09-27 DIAGNOSIS — M79674 Pain in right toe(s): Secondary | ICD-10-CM

## 2023-09-27 DIAGNOSIS — B351 Tinea unguium: Secondary | ICD-10-CM

## 2023-09-27 DIAGNOSIS — M79675 Pain in left toe(s): Secondary | ICD-10-CM

## 2023-09-27 DIAGNOSIS — M2141 Flat foot [pes planus] (acquired), right foot: Secondary | ICD-10-CM

## 2023-09-27 DIAGNOSIS — M2042 Other hammer toe(s) (acquired), left foot: Secondary | ICD-10-CM

## 2023-09-27 NOTE — Progress Notes (Unsigned)
 Chief Complaint  Patient presents with   RFC    RFC with callous. He also stated that he broke his toe in his 61s and it healed crocked. He also stated that his right foot will turn out when he walks, unless he really focuses on keeping it straight. He doe have hammertoes on the right foot. He would like recommendations on elevating the pain in the right foot. Not diabetic and takes ASA 325    HPI: 68 y.o. male presents today for treatment of painful, thickened, dystrophic toenails.  He has difficulty maintaining them himself due to their dystrophic nature.  They do cause pain with direct pressure and with shoe gear.  He also complains of some mild bilateral foot pain and notices that his feet externally rotate when he is walking.  He does complain of some hammertoes that did cause him pain at times.  Reports breaking his right first toe in his 20s that healed in an angulated position angled laterally.  Past Medical History:  Diagnosis Date   Apnea    Arthritis    Arthritis    spinal arthritis   Cancer (HCC)    melanoma, basal cell   Centrilobular emphysema (HCC) 07/08/2015   Depression    Emphysema of lung (HCC)    COPD   GERD (gastroesophageal reflux disease)    Hypercholesterolemia    Hypertension    Impaired fasting glucose    Kyphosis    mild   Migraine    Neuropathy    Osteoarthritis    Other testicular dysfunction    Scoliosis    Sleep apnea    Solitary pulmonary nodule    Tinnitus     Past Surgical History:  Procedure Laterality Date   COLONOSCOPY  2016   Colon polyps. Tubular Adenoma.   TONSILLECTOMY      Allergies  Allergen Reactions   Codeine Nausea Only and Nausea And Vomiting   Duloxetine     Loss of appetite   Fentanyl     Fatigue    Fluoxetine     Agitation   Ipratropium-Albuterol      Myalgia   Penicillins Itching and Other (See Comments)    As child    Zoloft [Sertraline]     Sweating     ROS denies any nausea, vomiting, fever,  chills, chest pain, shortness of breath   Physical Exam: There were no vitals filed for this visit.  General: The patient is alert and oriented x3 in no acute distress.  Dermatology: Skin is warm, dry and supple bilateral lower extremities. Interspaces are clear of maceration and debris.  Nail plates x 10 are thickened, elongated, dystrophic with yellow discoloration and subungual debris.  To have mycotic appearance.  They are tender on direct dorsal palpation.  Vascular: Palpable pedal pulses bilaterally. Capillary refill within normal limits.  +1 pitting edema present bilaterally.   Neurological: Light touch sensation grossly intact bilateral feet.   Musculoskeletal Exam: Mild pes planus foot type noted bilaterally.  Increased forefoot abduction noted with gait right greater than left.  Left foot muscle strength 5/5 in dorsiflexion, plantarflexion, inversion, eversion.  Right foot muscle strength 5/5 in dorsiflexion, plantarflexion, 4/5 for inversion and eversion.  Mild pain on palpation of right PT tendon at insertion site.  Hammertoe contractures of lesser digits noted that are reducible bilaterally.  Right foot bunion deformity present.  Assessment/Plan of Care: 1. Pes planus of both feet   2. Pain due to  onychomycosis of toenails of both feet   3. Acquired hammertoes of both feet      No orders of the defined types were placed in this encounter.  None  Discussed clinical findings with patient today.  # Pes planus of both feet # Hammertoes of both feet Discussed the etiology, pathomechanics and treatment options in detail with the patient.We discussed how pes planus deformity without pain or functional limitation is quite common and often does not require any treatment.  However when pain or functional limitation arises, treatment with nonsurgical therapy is our first line with stretching, physical therapy, and supportive orthoses.  Also discussed that when these treatments fail  patients often do well with surgical treatment of these deformities.  We also discussed the impact of the deformity on soft tissue and the joints and development of arthritis over time.  Today we did discuss custom orthotics.  I believe he would benefit from this and would be medically necessary to alter patient's biomechanics, increased gait stability, decreased out-toeing, prevent progressive deformity and help alleviate pain to plantar medial arches.  He is potentially interested in this, he will need to discuss this with his wife going forward.  Patient can call office he would like to proceed with this and we can put order as well as arrange for fitting appointment -I certify that this diagnosis represents a distinct and separate diagnosis that requires evaluation and treatment separate from other procedures or diagnosis  #Onychomycosis with pain  -Nails palliatively debrided as below. -Educated on self-care  Procedure: Nail Debridement Rationale: Pain Type of Debridement: manual, sharp debridement. Instrumentation: Nail nipper, rotary burr. Number of Nails: 10  Follow-up in 3 months for routine footcare, follow-up as needed for pes planus/hammertoes   Skylier Kretschmer L. Lamount MAUL, AACFAS [facility]     [address] [place], [place] [address]                Office [phone]  Fax [phone]

## 2023-09-28 ENCOUNTER — Ambulatory Visit: Admitting: Family Medicine

## 2023-09-28 VITALS — BP 122/82 | HR 83 | Temp 98.0°F | Ht 70.0 in | Wt 173.0 lb

## 2023-09-28 DIAGNOSIS — K219 Gastro-esophageal reflux disease without esophagitis: Secondary | ICD-10-CM

## 2023-09-28 DIAGNOSIS — K429 Umbilical hernia without obstruction or gangrene: Secondary | ICD-10-CM

## 2023-09-28 MED ORDER — PANTOPRAZOLE SODIUM 40 MG PO TBEC: 40.0000 mg | DELAYED_RELEASE_TABLET | Freq: Every day | ORAL | 1 refills | Status: DC

## 2023-09-28 NOTE — Progress Notes (Unsigned)
 Acute Office Visit  Subjective:    Patient ID: Jacob Graves, male    DOB: [birth year], 68 y.o.   MRN: [id number]  No chief complaint on file.   HPI: Patient is in today for lump above umbilical area, noticed it a few months ago, non painful,nothing makes it worse or better.  Patient is complaining of some burning in his throat.  This is happening daily.  He is concerned his omeprazole  is not working anymore.  He denies any infectious symptoms.  Past Medical History:  Diagnosis Date   Apnea    Arthritis    Arthritis    spinal arthritis   Cancer (HCC)    melanoma, basal cell   Centrilobular emphysema (HCC) 07/08/2015   Depression    Emphysema of lung (HCC)    COPD   GERD (gastroesophageal reflux disease)    Hypercholesterolemia    Hypertension    Impaired fasting glucose    Kyphosis    mild   Migraine    Neuropathy    Osteoarthritis    Other testicular dysfunction    Scoliosis    Sleep apnea    Solitary pulmonary nodule    Tinnitus     Past Surgical History:  Procedure Laterality Date   COLONOSCOPY  2016   Colon polyps. Tubular Adenoma.   TONSILLECTOMY      Family History  Problem Relation Age of Onset   Heart disease Father    Stroke Father    Breast cancer Maternal Grandmother    Leukemia Mother    Skin cancer Mother    Diabetes Mother    Stroke Mother    Hypertension Mother    Skin cancer Brother    Cancer Brother        Melanoma   Hypertension Brother    Colon cancer Neg Hx     Social History   Socioeconomic History   Marital status: Married    Spouse name: Alisa   Number of children: 2   Years of education: 12   Highest education level: Not on file  Occupational History   Occupation: disabled  Tobacco Use   Smoking status: Former    Average packs/day: 0.5 packs/day for 51.1 years (25.5 ttl pk-yrs)    Types: Cigarettes    Start date: 1974   Smokeless tobacco: Never   Tobacco comments:    Stopped smoking 03/2023    For years smoked 1  1/2 ppd   Vaping Use   Vaping status: Never Used  Substance and Sexual Activity   Alcohol use: No    Alcohol/week: 0.0 standard drinks of alcohol   Drug use: No   Sexual activity: Not Currently  Other Topics Concern   Not on file  Social History Narrative   Lives with wife   Caffeine- coffee, 3 cups daily   Social Drivers of Health   Financial Resource Strain: Low Risk  (03/22/2023)   Received from [facility]   Overall Financial Resource Strain (CARDIA)    Difficulty of Paying Living Expenses: Not hard at all  Food Insecurity: No Food Insecurity (03/22/2023)   Received from [facility]   Hunger Vital Sign    Within the past 12 months, you worried that your food would run out before you got the money to buy more.: Never true    Within the past 12 months, the food you bought just didn't last and you didn't have money to get more.: Never true  Transportation Needs:  No Transportation Needs (03/22/2023)   Received from [facility] - Transportation    Lack of Transportation (Medical): No    Lack of Transportation (Non-Medical): No  Physical Activity: Inactive (08/31/2022)   Exercise Vital Sign    Days of Exercise per Week: 0 days    Minutes of Exercise per Session: 0 min  Stress: No Stress Concern Present (11/10/2022)   Received from [facility] - Occupational Stress Questionnaire    Feeling of Stress : Only a little  Social Connections: Socially Integrated (11/18/2022)   Social Connection and Isolation Panel    Frequency of Communication with Friends and Family: More than three times a week    Frequency of Social Gatherings with Friends and Family: Once a week    Attends Religious Services: More than 4 times per year    Active Member of [employer] or Organizations: Yes    Attends Engineer, structural: More than 4 times per year    Marital Status: Married  Catering manager Violence: Not At Risk (03/22/2023)    Received from [facility]   Humiliation, Afraid, Rape, and Kick questionnaire    Within the last year, have you been afraid of your partner or ex-partner?: No    Within the last year, have you been humiliated or emotionally abused in other ways by your partner or ex-partner?: No    Within the last year, have you been kicked, hit, slapped, or otherwise physically hurt by your partner or ex-partner?: No    Within the last year, have you been raped or forced to have any kind of sexual activity by your partner or ex-partner?: No    Outpatient Medications Prior to Visit  Medication Sig Dispense Refill   tamsulosin  (FLOMAX ) 0.4 MG CAPS capsule Take 0.4 mg by mouth.     albuterol  (VENTOLIN  HFA) 108 (90 Base) MCG/ACT inhaler INHALE 2 PUFFS BY MOUTH EVERY 6 HOURS ASNEEDED FOR SHORT OF BREATH OR WHEEZING 8.5 g 1   Aspirin-Acetaminophen-Caffeine 500-325-65 MG PACK Take 325 mg by mouth 2 (two) times daily as needed.     Buprenorphine HCl-Naloxone HCl 8-2 MG FILM Place under the tongue every other day. (Patient taking differently: Place under the tongue every other day. Taking 1/2 tablet every day)     fexofenadine  (ALLEGRA ) 180 MG tablet Take 1 tablet (180 mg total) by mouth daily. 90 tablet 3   fluticasone  (FLONASE ) 50 MCG/ACT nasal spray INHALE 1 SPRAY IN EACH NOSTRIL EVERY DAY 16 g 2   gabapentin  (NEURONTIN ) 600 MG tablet TAKE ONE TABLET BY MOUTH EVERY MORNING AT NOON  IN THE EVENING  AND AT BEDTIME 360 tablet 3   GINSENG PO Take by mouth.     ibuprofen  (ADVIL ) 800 MG tablet TAKE ONE TABLET BY MOUTH 3 TIMES A DAY 270 tablet 1   MAGNESIUM ASPARTATE PO Take by mouth.     meclizine  (ANTIVERT ) 25 MG tablet TAKE ONE TABLET BY MOUTH 3 TIMES A DAY AS NEEDED FOR DIZZINESS 30 tablet 3   montelukast  (SINGULAIR ) 10 MG tablet TAKE ONE TABLET BY MOUTH EVERY DAY 90 tablet 1   NON FORMULARY Miracle Moo     ondansetron  (ZOFRAN ) 4 MG tablet TAKE ONE TABLET BY MOUTH EVERY 8 HOURS AS NEEDED FOR NAUSEA/VOMITING 60  tablet 1   oxybutynin  (DITROPAN  XL) 5 MG 24 hr tablet Take 1 tablet (5 mg total) by mouth at bedtime. 30 tablet 2  raNITIdine HCl (ZANTAC PO) Take by mouth.     SPIRIVA  RESPIMAT 2.5 MCG/ACT AERS INHALE 2 PUFFS BY MOUTH EVERY DAY 4 g 2   traZODone  (DESYREL ) 50 MG tablet TAKE 1/2-1 TABLET AT BEDTIME AS NEEDED FOR SLEEP 90 tablet 1   valsartan  (DIOVAN ) 80 MG tablet TAKE ONE TABLET BY MOUTH EVERY DAY 30 tablet 2   omeprazole  (PRILOSEC) 40 MG capsule TAKE 1 CAPSULE BY MOUTH DAILY  BEFORE A MEAL 100 capsule 2   No facility-administered medications prior to visit.    Allergies  Allergen Reactions   Codeine Nausea Only and Nausea And Vomiting   Duloxetine     Loss of appetite   Fentanyl     Fatigue    Fluoxetine     Agitation   Ipratropium-Albuterol      Myalgia   Penicillins Itching and Other (See Comments)    As child    Zoloft [Sertraline]     Sweating     Review of Systems  Constitutional:  Negative for chills, diaphoresis, fatigue and fever.  HENT:  Negative for congestion, ear pain and sore throat.   Respiratory:  Negative for cough and shortness of breath.   Cardiovascular:  Negative for chest pain and leg swelling.  Gastrointestinal:  Positive for constipation. Negative for abdominal pain, diarrhea, nausea and vomiting.  Genitourinary:  Negative for dysuria and urgency.  Musculoskeletal:  Negative for arthralgias and myalgias.  Neurological:  Negative for dizziness and headaches.  Psychiatric/Behavioral:  Negative for dysphoric mood.        Objective:        09/28/2023   10:23 AM 08/16/2023    2:17 PM 12/17/2022    9:18 AM  Vitals with BMI  Height 5' 10 5' 10 5' 10  Weight 173 lbs 178 lbs 176 lbs  BMI 24.82 25.54 25.25  Systolic 122 110 869  Diastolic 82 64 88  Pulse 83 97 86    No data found.   Physical Exam Vitals reviewed.  Constitutional:      Appearance: Normal appearance.  HENT:     Mouth/Throat:     Pharynx: Posterior oropharyngeal erythema  (mild) present.  Cardiovascular:     Rate and Rhythm: Normal rate and regular rhythm.     Pulses: Normal pulses.     Heart sounds: Normal heart sounds.  Pulmonary:     Effort: Pulmonary effort is normal.     Breath sounds: Normal breath sounds. No wheezing, rhonchi or rales.  Abdominal:     General: Bowel sounds are normal.     Palpations: Abdomen is soft.     Tenderness: There is no abdominal tenderness.     Hernia: A hernia (umbilical 2 cm) is present.  Lymphadenopathy:     Cervical: No cervical adenopathy.     Upper Body:     Right upper body: No supraclavicular adenopathy.     Left upper body: No supraclavicular adenopathy.  Neurological:     Mental Status: He is alert.  Psychiatric:        Mood and Affect: Mood normal.        Behavior: Behavior normal.     Health Maintenance Due  Topic Date Due   HIV Screening  Never done   Zoster Vaccines- Shingrix (1 of 2) Never done   Medicare Annual Wellness (AWV)  11/13/2020   Lung Cancer Screening  11/11/2021    There are no preventive care reminders to display for this patient.   Lab Results  Component Value Date   TSH 1.280 08/16/2023   Lab Results  Component Value Date   WBC 7.0 08/16/2023   HGB 13.3 08/16/2023   HCT 41.1 08/16/2023   MCV 92 08/16/2023   PLT 189 08/16/2023   Lab Results  Component Value Date   NA 143 08/16/2023   K 4.7 08/16/2023   CO2 26 08/16/2023   GLUCOSE 106 (H) 08/16/2023   BUN 21 08/16/2023   CREATININE 0.89 08/16/2023   BILITOT <0.2 08/16/2023   ALKPHOS 90 08/16/2023   AST 8 08/16/2023   ALT 9 08/16/2023   PROT 6.4 08/16/2023   ALBUMIN 4.2 08/16/2023   CALCIUM  8.9 08/16/2023   EGFR 95 08/16/2023   GFR 95.22 07/15/2017   Lab Results  Component Value Date   CHOL 252 (H) 08/16/2023   Lab Results  Component Value Date   HDL 45 08/16/2023   Lab Results  Component Value Date   LDLCALC 166 (H) 08/16/2023   Lab Results  Component Value Date   TRIG 219 (H) 08/16/2023    Lab Results  Component Value Date   CHOLHDL 5.6 (H) 08/16/2023   Lab Results  Component Value Date   HGBA1C 5.7 (H) 08/16/2023       Assessment & Plan:  Umbilical hernia without obstruction and without gangrene  Gastroesophageal reflux disease without esophagitis Assessment & Plan: The current medical regimen is effective;  continue present plan and medications. Continue taking omeprazole  40 mg daily.     Other orders -     Pantoprazole  Sodium; Take 1 tablet (40 mg total) by mouth daily.  Dispense: 90 tablet; Refill: 1     Meds ordered this encounter  Medications   pantoprazole  (PROTONIX ) 40 MG tablet    Sig: Take 1 tablet (40 mg total) by mouth daily.    Dispense:  90 tablet    Refill:  1    No orders of the defined types were placed in this encounter.    Follow-up: No follow-ups on file.  An After Visit Summary was printed and given to the patient.   LILLETTE Kato I Leal-Borjas,acting as a scribe for Abigail Free, MD.,have documented all relevant documentation on the behalf of Abigail Free, MD,as directed by  Abigail Free, MD while in the presence of Abigail Free, MD.   Abigail Free, MD [facility] [phone]

## 2023-09-28 NOTE — Assessment & Plan Note (Signed)
 The current medical regimen is effective;  continue present plan and medications. Continue taking omeprazole  40 mg daily.

## 2023-09-28 NOTE — Patient Instructions (Signed)
 TAKE OMEPRAZOLE  40 MG TWICE A DAY  THEN START ON PANTOPRAZOLE  40 MG every day.

## 2023-09-29 ENCOUNTER — Encounter: Payer: Self-pay | Admitting: Family Medicine

## 2023-09-29 NOTE — Assessment & Plan Note (Signed)
 Reassurance given.  Education given.

## 2023-10-04 ENCOUNTER — Other Ambulatory Visit: Payer: Self-pay | Admitting: Family Medicine

## 2023-10-12 DIAGNOSIS — J439 Emphysema, unspecified: Secondary | ICD-10-CM | POA: Diagnosis not present

## 2023-10-17 DIAGNOSIS — R3915 Urgency of urination: Secondary | ICD-10-CM | POA: Diagnosis not present

## 2023-10-17 DIAGNOSIS — R3914 Feeling of incomplete bladder emptying: Secondary | ICD-10-CM | POA: Diagnosis not present

## 2023-10-28 ENCOUNTER — Other Ambulatory Visit: Payer: Self-pay | Admitting: Family Medicine

## 2023-11-03 ENCOUNTER — Telehealth: Payer: Self-pay

## 2023-11-03 NOTE — Telephone Encounter (Signed)
 Copied from CRM [id number]. Topic: Clinical - Medication Question >> Nov 03, 2023  2:29 PM Marissa P wrote: Reason for CRM: Saddie pharmacist with [employer]  called to see if doctor can lower dosage for meclizine  (ANTIVERT ) 25 MG tablet and oxybutynin  (DITROPAN  XL) 5 MG 24 hr tablet or an alternate cb# [phone]

## 2023-11-04 NOTE — Telephone Encounter (Signed)
 They recommended to change myrbetriq because it did not give sedation or confusion to the patient and it is recommended for his age.

## 2023-11-07 ENCOUNTER — Other Ambulatory Visit: Payer: Self-pay | Admitting: Family Medicine

## 2023-11-07 MED ORDER — MIRABEGRON ER 25 MG PO TB24: 25.0000 mg | ORAL_TABLET | Freq: Every day | ORAL | 2 refills | Status: DC

## 2023-11-08 ENCOUNTER — Other Ambulatory Visit: Payer: Self-pay

## 2023-11-08 NOTE — Telephone Encounter (Signed)
 Error

## 2023-11-14 ENCOUNTER — Other Ambulatory Visit: Payer: Self-pay | Admitting: Family Medicine

## 2023-11-14 DIAGNOSIS — I10 Essential (primary) hypertension: Secondary | ICD-10-CM

## 2023-11-15 ENCOUNTER — Other Ambulatory Visit: Payer: Self-pay | Admitting: Family Medicine

## 2023-11-15 DIAGNOSIS — N3941 Urge incontinence: Secondary | ICD-10-CM

## 2023-11-24 ENCOUNTER — Encounter: Payer: Self-pay | Admitting: Family Medicine

## 2023-11-24 NOTE — Assessment & Plan Note (Addendum)
 Prediabetes with improved A1c. Lifestyle modifications aiding glycemic control. - Continue current dietary habits to maintain glycemic control. Orders:   POCT glycosylated hemoglobin (Hb A1C)

## 2023-11-24 NOTE — Assessment & Plan Note (Addendum)
 Well controlled Continue Valsartan  80 mg by mouth once daily. Continue to work on eating a healthy diet and exercise.

## 2023-11-24 NOTE — Assessment & Plan Note (Addendum)
 COPD managed with Spiriva  and albuterol . Educated on regular Spiriva  use benefits. - Continue albuterol  as needed for rescue.

## 2023-11-24 NOTE — Assessment & Plan Note (Addendum)
 Improved lipid profile with lifestyle modifications. No statin therapy due to patient preference. - Continue lifestyle modifications to further improve lipid profile. Orders:   POCT Lipid Panel

## 2023-11-24 NOTE — Progress Notes (Unsigned)
 Subjective:  Patient ID: [name], male    DOB: 1956-02-11  Age: 68 y.o. MRN: [id number]  Chief Complaint  Patient presents with   Medical Management of Chronic Issues    HPI: Discussed the use of AI scribe software for clinical note transcription with the patient, who gave verbal consent to proceed.  History of Present Illness Jacob Graves is a 68 year old male with COPD and hypertension who presents for a follow-up visit.  Chronic obstructive pulmonary disease (copd) symptoms and management - Uses Spiriva  occasionally for maintenance therapy - Carries albuterol  inhaler as a rescue medication during outdoor work - No significant shortness of breath with walking - Quit smoking in January and has not restarted - No chest pain or breathing problems  Hypertension and cardiovascular risk factors - Takes valsartan  80 mg once daily for blood pressure control - No chest pain - Working on dietary improvements, including reducing sugar intake to mainly sweet tea and coffee - LDL cholesterol improved from 166 to 136 - Triglycerides improved from 219 to 100 - Total cholesterol improved from 252 to 212 - A1c improved from 5.7 to 5.5  Chronic back pain and sciatica - Takes gabapentin  600 mg four times daily for back pain - Occasionally uses ibuprofen  for pain relief - History of sciatica with right leg numbness since prior nerve ablation - Recent episode of right leg numbness after weed eating, resolved with rest and ibuprofen  - Back pain and leg numbness worsen with prolonged walking - Requires focus on each step to prevent falls - Uses a staff for walking in the yard to prevent falls - No falls reported  Gastrointestinal symptoms and management - Takes pantoprazole  once daily for gastrointestinal symptoms - Stopped using ranitidine - Takes Dulcolax three times a day as a stool softener, which is effective - No abdominal pain, nausea, vomiting, diarrhea, or  constipation  Dizziness - Occasionally uses meclizine  for dizziness  Sleep disturbance - Takes trazodone  for sleep  Opioid dependence and withdrawal symptoms - Currently weaning off Suboxone, taking half a tablet daily - Experiences withdrawal symptoms when attempting to stop completely  Musculoskeletal soreness - Experiencing soreness due to increased physical activity while preparing for son's visit  Mood and psychiatric symptoms - No depression or anxiety       08/21/2023    4:35 PM 10/28/2022    8:43 AM 08/31/2022    9:17 AM 02/08/2022    2:01 PM 08/19/2021   11:03 AM  Depression screen PHQ 2/9  Decreased Interest 0 0 3 0 0  Down, Depressed, Hopeless 0 0 0 0 0  PHQ - 2 Score 0 0 3 0 0  Altered sleeping  2 3    Tired, decreased energy  3 3    Change in appetite  3 0    Feeling bad or failure about yourself   0 0    Trouble concentrating  2 3    Moving slowly or fidgety/restless  0 0    Suicidal thoughts  0 0    PHQ-9 Score  10 12    Difficult doing work/chores  Somewhat difficult Very difficult          10/28/2022    8:43 AM  Fall Risk   Falls in the past year? 1  Number falls in past yr: 0  Injury with Fall? 0  Risk for fall due to : Impaired balance/gait  Follow up Falls evaluation completed;Falls prevention discussed  Patient Care Team: Sherre Clapper, MD as PCP - General (Family Medicine) Trudy Lynwood DASEN, MD as Referring Physician (Dermatology) Geronimo Amel, MD as Consulting Physician (Pulmonary Disease) Delores Lauraine NOVAK, [facility] (Inactive) as Pharmacist (Pharmacist)   Review of Systems  Constitutional:  Negative for appetite change, fatigue and fever.  HENT:  Negative for congestion, ear pain, sinus pressure and sore throat.   Respiratory:  Negative for cough, chest tightness, shortness of breath and wheezing.   Cardiovascular:  Negative for chest pain and palpitations.  Gastrointestinal:  Negative for abdominal pain, constipation, diarrhea, nausea and  vomiting.  Genitourinary:  Negative for dysuria and hematuria.  Musculoskeletal:  Negative for arthralgias, back pain, joint swelling and myalgias.  Skin:  Negative for rash.  Neurological:  Negative for dizziness, weakness and headaches.  Psychiatric/Behavioral:  Negative for dysphoric mood. The patient is not nervous/anxious.     Current Outpatient Medications on File Prior to Visit  Medication Sig Dispense Refill   albuterol  (VENTOLIN  HFA) 108 (90 Base) MCG/ACT inhaler INHALE 2 PUFFS BY MOUTH EVERY 6 HOURS ASNEEDED FOR SHORT OF BREATH OR WHEEZING 8.5 g 1   Aspirin-Acetaminophen-Caffeine 500-325-65 MG PACK Take 325 mg by mouth 2 (two) times daily as needed.     Buprenorphine HCl-Naloxone HCl 8-2 MG FILM Place under the tongue every other day. (Patient taking differently: Place under the tongue every other day. Taking 1/2 tablet every day)     fexofenadine  (ALLEGRA ) 180 MG tablet Take 1 tablet (180 mg total) by mouth daily. 90 tablet 3   fluticasone  (FLONASE ) 50 MCG/ACT nasal spray INHALE 1 SPRAY IN EACH NOSTRIL EVERY DAY 16 g 2   gabapentin  (NEURONTIN ) 600 MG tablet TAKE ONE TABLET BY MOUTH EVERY MORNING AT NOON  IN THE EVENING  AND AT BEDTIME 360 tablet 3   GINSENG PO Take by mouth.     ibuprofen  (ADVIL ) 800 MG tablet TAKE ONE TABLET BY MOUTH 3 TIMES A DAY 270 tablet 1   MAGNESIUM ASPARTATE PO Take by mouth.     meclizine  (ANTIVERT ) 25 MG tablet TAKE ONE TABLET BY MOUTH 3 TIMES A DAY AS NEEDED FOR DIZZINESS 30 tablet 3   mirabegron  ER (MYRBETRIQ ) 25 MG TB24 tablet Take 1 tablet (25 mg total) by mouth daily. 30 tablet 2   montelukast  (SINGULAIR ) 10 MG tablet TAKE ONE TABLET BY MOUTH EVERY DAY 90 tablet 1   NON FORMULARY Miracle Moo     ondansetron  (ZOFRAN ) 4 MG tablet TAKE ONE TABLET BY MOUTH EVERY 8 HOURS AS NEEDED FOR NAUSEA/VOMITING 60 tablet 1   pantoprazole  (PROTONIX ) 40 MG tablet Take 1 tablet (40 mg total) by mouth daily. 90 tablet 1   SPIRIVA  RESPIMAT 2.5 MCG/ACT AERS INHALE 2  PUFFS BY MOUTH EVERY DAY 4 g 2   traZODone  (DESYREL ) 50 MG tablet TAKE 1/2-1 TABLET AT BEDTIME AS NEEDED FOR SLEEP 90 tablet 1   valsartan  (DIOVAN ) 80 MG tablet TAKE ONE TABLET BY MOUTH EVERY DAY 30 tablet 2   No current facility-administered medications on file prior to visit.   Past Medical History:  Diagnosis Date   Apnea    Arthritis    Arthritis    spinal arthritis   Cancer (HCC)    melanoma, basal cell   Centrilobular emphysema (HCC) 07/08/2015   Depression    Emphysema of lung (HCC)    COPD   GERD (gastroesophageal reflux disease)    Hypercholesterolemia    Hypertension    Impaired fasting glucose    Kyphosis  mild   Migraine    Neuropathy    Osteoarthritis    Other testicular dysfunction    Pneumonia of right lower lobe due to infectious organism 12/18/2022   Scoliosis    Sleep apnea    Solitary pulmonary nodule    Tinnitus    Past Surgical History:  Procedure Laterality Date   COLONOSCOPY  2016   Colon polyps. Tubular Adenoma.   TONSILLECTOMY      Family History  Problem Relation Age of Onset   Heart disease Father    Stroke Father    Breast cancer Maternal Grandmother    Leukemia Mother    Skin cancer Mother    Diabetes Mother    Stroke Mother    Hypertension Mother    Skin cancer Brother    Cancer Brother        Melanoma   Hypertension Brother    Colon cancer Neg Hx    Social History   Socioeconomic History   Marital status: Married    Spouse name: Alisa   Number of children: 2   Years of education: 12   Highest education level: Not on file  Occupational History   Occupation: disabled  Tobacco Use   Smoking status: Former    Average packs/day: 0.5 packs/day for 51.1 years (25.5 ttl pk-yrs)    Types: Cigarettes    Start date: 1974   Smokeless tobacco: Never   Tobacco comments:    Stopped smoking 03/2023    For years smoked 1 1/2 ppd   Vaping Use   Vaping status: Never Used  Substance and Sexual Activity   Alcohol use: No     Alcohol/week: 0.0 standard drinks of alcohol   Drug use: No   Sexual activity: Not Currently  Other Topics Concern   Not on file  Social History Narrative   Lives with wife   Caffeine- coffee, 3 cups daily   Social Drivers of Health   Financial Resource Strain: Low Risk  (03/22/2023)   [facility]   Overall Financial Resource Strain (CARDIA)    Difficulty of Paying Living Expenses: Not hard at all  Food Insecurity: No Food Insecurity (03/22/2023)   Received from [facility]   Hunger Vital Sign    Within the past 12 months, you worried that your food would run out before you got the money to buy more.: Never true    Within the past 12 months, the food you bought just didn't last and you didn't have money to get more.: Never true  Transportation Needs: No Transportation Needs (03/22/2023)   Received from [facility]   PRAPARE - Transportation    Lack of Transportation (Medical): No    Lack of Transportation (Non-Medical): No  Physical Activity: Inactive (08/31/2022)   Exercise Vital Sign    Days of Exercise per Week: 0 days    Minutes of Exercise per Session: 0 min  Stress: No Stress Concern Present (11/10/2022)   Received from [facility] Occupational [facility] - Occupational Stress Questionnaire    Feeling of Stress : Only a little  Social Connections: Socially Integrated (11/18/2022)   Social Connection and Isolation Panel    Frequency of Communication with Friends and Family: More than three times a week    Frequency of Social Gatherings with Friends and Family: Once a week    Attends Religious Services: More than 4 times per year    Active Member of [employer]  or Organizations: Yes    Attends Engineer, structural: More than 4 times per year    Marital Status: Married    Objective:  BP 138/80   Pulse 65   Temp 98.2 F (36.8 C) (Temporal)   Resp 18   Ht 5' 10 (1.778 m)   Wt 173 lb 6.4 oz (78.7 kg)   SpO2 96%   BMI  24.88 kg/m      11/25/2023    8:54 AM 09/28/2023   10:23 AM 08/16/2023    2:17 PM  BP/Weight  Systolic BP 138 122 110  Diastolic BP 80 82 64  Wt. (Lbs) 173.4 173 178  BMI 24.88 kg/m2 24.82 kg/m2 25.54 kg/m2    Physical Exam Vitals reviewed.  Constitutional:      Appearance: Normal appearance.  Neck:     Vascular: No carotid bruit.  Cardiovascular:     Rate and Rhythm: Normal rate and regular rhythm.     Pulses: Normal pulses.     Heart sounds: Normal heart sounds.  Pulmonary:     Effort: Pulmonary effort is normal.     Breath sounds: Normal breath sounds. No wheezing, rhonchi or rales.  Abdominal:     General: Bowel sounds are normal.     Palpations: Abdomen is soft.     Tenderness: There is no abdominal tenderness.     Hernia: A hernia (UMBILICAL) is present.  Musculoskeletal:     Comments: NEG SLR BL.  Neurological:     Mental Status: He is alert.  Psychiatric:        Mood and Affect: Mood normal.        Behavior: Behavior normal.     {Perform Simple Foot Exam  Perform Detailed exam:1} {Insert foot Exam (Optional):30965}   Lab Results  Component Value Date   WBC 7.0 08/16/2023   HGB 13.3 08/16/2023   HCT 41.1 08/16/2023   PLT 189 08/16/2023   GLUCOSE 106 (H) 08/16/2023   CHOL 252 (H) 08/16/2023   TRIG 219 (H) 08/16/2023   HDL 45 08/16/2023   LDLCALC 166 (H) 08/16/2023   ALT 9 08/16/2023   AST 8 08/16/2023   NA 143 08/16/2023   K 4.7 08/16/2023   CL 102 08/16/2023   CREATININE 0.89 08/16/2023   BUN 21 08/16/2023   CO2 26 08/16/2023   TSH 1.280 08/16/2023   INR 1.0 01/27/2021   HGBA1C 5.5 11/25/2023    Results for orders placed or performed in visit on 11/25/23  POCT glycosylated hemoglobin (Hb A1C)   Collection Time: 11/25/23  9:09 AM  Result Value Ref Range   Hemoglobin A1C     HbA1c POC (<> result, manual entry) 5.5 4.0 - 5.6 %   HbA1c, POC (prediabetic range)     HbA1c, POC (controlled diabetic range)    POCT Lipid Panel   Collection  Time: 11/25/23  9:09 AM  Result Value Ref Range   TC 212    HDL 56    TRG 100    LDL 136    Non-HDL 156    TC/HDL    .  Assessment & Plan:   Assessment & Plan Essential hypertension, benign Well controlled Continue Valsartan  80 mg by mouth once daily. Continue to work on eating a healthy diet and exercise.     Prediabetes Prediabetes with improved A1c. Lifestyle modifications aiding glycemic control. - Continue current dietary habits to maintain glycemic control. Orders:   POCT glycosylated hemoglobin (Hb A1C)  Mixed hyperlipidemia  Improved lipid profile with lifestyle modifications. No statin therapy due to patient preference. - Continue lifestyle modifications to further improve lipid profile. Orders:   POCT Lipid Panel  OSA (obstructive sleep apnea) Intolerant to cpap.      Centrilobular emphysema (HCC) COPD managed with Spiriva  and albuterol . Educated on regular Spiriva  use benefits. - Continue albuterol  as needed for rescue.    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 Nerve ablation performed. Recommended core exercises. - Advise ibuprofen  three times a day during flare-ups. - Provide exercises for sciatica management.    History of tobacco abuse Recommended to quit smoking. Orders:   CT CHEST LUNG CANCER SCREENING LOW DOSE WO CONTRAST  Encounter for monitoring Suboxone maintenance therapy Opioid dependence managed with Suboxone. Experiencing withdrawal symptoms during tapering. - Continue current Suboxone regimen while attempting gradual tapering.      Body mass index is 24.88 kg/m.  Assessment & Plan   No orders of the defined types were placed in this encounter.   Orders Placed This Encounter  Procedures   CT CHEST LUNG CANCER SCREENING LOW DOSE WO CONTRAST   POCT glycosylated hemoglobin (Hb A1C)   POCT Lipid Panel     I,Marla I Leal-Borjas,acting as a scribe for Abigail Free, MD.,have documented all relevant documentation on the behalf of Abigail Free, MD,as directed by  Abigail Free, MD while in the presence of Abigail Free, MD.    Follow-up: Return in about 4 months (around 03/26/2024) for chronic follow up.  An After Visit Summary was printed and given to the patient.  Abigail Free, MD [facility] [phone]

## 2023-11-24 NOTE — Assessment & Plan Note (Addendum)
Intolerant to cpap.  

## 2023-11-25 ENCOUNTER — Ambulatory Visit (INDEPENDENT_AMBULATORY_CARE_PROVIDER_SITE_OTHER): Admitting: Family Medicine

## 2023-11-25 VITALS — BP 138/80 | HR 65 | Temp 98.2°F | Resp 18 | Ht 70.0 in | Wt 173.4 lb

## 2023-11-25 DIAGNOSIS — R7303 Prediabetes: Secondary | ICD-10-CM | POA: Diagnosis not present

## 2023-11-25 DIAGNOSIS — G8929 Other chronic pain: Secondary | ICD-10-CM | POA: Diagnosis not present

## 2023-11-25 DIAGNOSIS — Z87891 Personal history of nicotine dependence: Secondary | ICD-10-CM | POA: Diagnosis not present

## 2023-11-25 DIAGNOSIS — G4733 Obstructive sleep apnea (adult) (pediatric): Secondary | ICD-10-CM | POA: Diagnosis not present

## 2023-11-25 DIAGNOSIS — Z79891 Long term (current) use of opiate analgesic: Secondary | ICD-10-CM | POA: Diagnosis not present

## 2023-11-25 DIAGNOSIS — I10 Essential (primary) hypertension: Secondary | ICD-10-CM

## 2023-11-25 DIAGNOSIS — J432 Centrilobular emphysema: Secondary | ICD-10-CM | POA: Diagnosis not present

## 2023-11-25 DIAGNOSIS — M5442 Lumbago with sciatica, left side: Secondary | ICD-10-CM | POA: Diagnosis not present

## 2023-11-25 DIAGNOSIS — E782 Mixed hyperlipidemia: Secondary | ICD-10-CM

## 2023-11-25 DIAGNOSIS — Z5181 Encounter for therapeutic drug level monitoring: Secondary | ICD-10-CM | POA: Diagnosis not present

## 2023-11-25 LAB — POCT LIPID PANEL
HDL: 56
LDL: 136
Non-HDL: 156
TC: 212
TRG: 100

## 2023-11-25 LAB — POCT GLYCOSYLATED HEMOGLOBIN (HGB A1C): HbA1c POC (<> result, manual entry): 5.5 % (ref 4.0–5.6)

## 2023-11-25 NOTE — Patient Instructions (Signed)
  VISIT SUMMARY: You had a follow-up visit to discuss your COPD, hypertension, back pain, gastrointestinal symptoms, dizziness, sleep disturbance, opioid dependence, musculoskeletal soreness, and mood. Your COPD and hypertension are well-managed, and your cholesterol and blood sugar levels have improved. We discussed continuing your current medications and lifestyle modifications.  YOUR PLAN: COPD: Your COPD is being managed with Spiriva  and albuterol . -Continue using Spiriva  regularly for maintenance and albuterol  as needed for rescue.  CHRONIC LOW BACK PAIN WITH SCIATICA: You have chronic low back pain with sciatica, which worsens with prolonged walking. -Take ibuprofen  three times a day during flare-ups. -Perform recommended core exercises to manage sciatica.  OPIOID DEPENDENCE: You are currently weaning off Suboxone and experiencing withdrawal symptoms. -Continue your current Suboxone regimen and attempt gradual tapering.  HYPERTENSION: Your hypertension is managed with valsartan  80 mg daily. -Continue taking valsartan  80 mg once daily.  MIXED HYPERLIPIDEMIA: Your cholesterol levels have improved with lifestyle modifications. -Continue with your current lifestyle modifications to further improve your lipid profile.  PREDIABETES: Your A1c has improved with lifestyle modifications. -Continue your current dietary habits to maintain glycemic control.  ORDERED LUNG CT SCAN LOW DOSE FOR LUNG CANCER SCREENING AT THE [facility].  [facility] [address] [place], [place] [address] [phone]  IMAGING 8-5 PM MONDAY THROUGH FRIDAY.                      Contains text generated by [employer].                                 Contains text generated by [employer]bridge.

## 2023-11-26 DIAGNOSIS — G8929 Other chronic pain: Secondary | ICD-10-CM | POA: Insufficient documentation

## 2023-11-26 DIAGNOSIS — Z5181 Encounter for therapeutic drug level monitoring: Secondary | ICD-10-CM | POA: Insufficient documentation

## 2023-11-26 NOTE — Assessment & Plan Note (Addendum)
 Recommended to quit smoking. Orders:   CT CHEST LUNG CANCER SCREENING LOW DOSE WO CONTRAST

## 2023-11-26 NOTE — Assessment & Plan Note (Signed)
 Opioid dependence managed with Suboxone. Experiencing withdrawal symptoms during tapering. - Continue current Suboxone regimen while attempting gradual tapering.

## 2023-11-26 NOTE — Assessment & Plan Note (Signed)
 Chronic low back pain with left-sided sciatica. Nerve ablation performed. Recommended core exercises. - Advise ibuprofen  three times a day during flare-ups. - Provide exercises for sciatica management.

## 2023-11-27 ENCOUNTER — Encounter: Payer: Self-pay | Admitting: Family Medicine

## 2023-12-06 ENCOUNTER — Other Ambulatory Visit: Payer: Self-pay | Admitting: Family Medicine

## 2023-12-07 ENCOUNTER — Ambulatory Visit (INDEPENDENT_AMBULATORY_CARE_PROVIDER_SITE_OTHER)

## 2023-12-07 VITALS — Ht 70.0 in | Wt 174.0 lb

## 2023-12-07 DIAGNOSIS — Z0000 Encounter for general adult medical examination without abnormal findings: Secondary | ICD-10-CM | POA: Diagnosis not present

## 2023-12-07 NOTE — Patient Instructions (Signed)
 Jacob Graves , Thank you for taking time to come for your Medicare Wellness Visit. I appreciate your ongoing commitment to your health goals. Please review the following plan we discussed and let me know if I can assist you in the future.    This is a list of the screening recommended for you and due dates:  Health Maintenance  Topic Date Due   Zoster (Shingles) Vaccine (1 of 2) Never done   Screening for Lung Cancer  11/11/2021   Flu Shot  05/29/2024*   Pneumococcal Vaccine for age over 79 (3 of 3 - PCV20 or PCV21) 11/24/2024*   DTaP/Tdap/Td vaccine (2 - Td or Tdap) 10/14/2024   Medicare Annual Wellness Visit  12/06/2024   Colon Cancer Screening  06/03/2031   Hepatitis C Screening  Completed   Meningitis B Vaccine  Aged Out   COVID-19 Vaccine  Discontinued  *Topic was postponed. The date shown is not the original due date.    Advanced directives: patient stated not at this time  Next appointment: Follow up in one year for your annual wellness visit. October 14th, 2026 at 10:00.  Preventive Care 100 Years and Older, Male  Preventive care refers to lifestyle choices and visits with your health care provider that can promote health and wellness. What does preventive care include? A yearly physical exam. This is also called an annual well check. Dental exams once or twice a year. Routine eye exams. Ask your health care provider how often you should have your eyes checked. Personal lifestyle choices, including: Daily care of your teeth and gums. Regular physical activity. Eating a healthy diet. Avoiding tobacco and drug use. Limiting alcohol use. Practicing safe sex. Taking low doses of aspirin every day. Taking vitamin and mineral supplements as recommended by your health care provider. What happens during an annual well check? The services and screenings done by your health care provider during your annual well check will depend on your age, overall health, lifestyle risk factors,  and family history of disease. Counseling  Your health care provider may ask you questions about your: Alcohol use. Tobacco use. Drug use. Emotional well-being. Home and relationship well-being. Sexual activity. Eating habits. History of falls. Memory and ability to understand (cognition). Work and work Astronomer. Screening  You may have the following tests or measurements: Height, weight, and BMI. Blood pressure. Lipid and cholesterol levels. These may be checked every 5 years, or more frequently if you are over 33 years old. Skin check. Lung cancer screening. You may have this screening every year starting at age 76 if you have a 30-pack-year history of smoking and currently smoke or have quit within the past 15 years. Fecal occult blood test (FOBT) of the stool. You may have this test every year starting at age 58. Flexible sigmoidoscopy or colonoscopy. You may have a sigmoidoscopy every 5 years or a colonoscopy every 10 years starting at age 32. Prostate cancer screening. Recommendations will vary depending on your family history and other risks. Hepatitis C blood test. Hepatitis B blood test. Sexually transmitted disease (STD) testing. Diabetes screening. This is done by checking your blood sugar (glucose) after you have not eaten for a while (fasting). You may have this done every 1-3 years. Abdominal aortic aneurysm (AAA) screening. You may need this if you are a current or former smoker. Osteoporosis. You may be screened starting at age 66 if you are at high risk. Talk with your health care provider about your test results, treatment  options, and if necessary, the need for more tests. Vaccines  Your health care provider may recommend certain vaccines, such as: Influenza vaccine. This is recommended every year. Tetanus, diphtheria, and acellular pertussis (Tdap, Td) vaccine. You may need a Td booster every 10 years. Zoster vaccine. You may need this after age  25. Pneumococcal 13-valent conjugate (PCV13) vaccine. One dose is recommended after age 37. Pneumococcal polysaccharide (PPSV23) vaccine. One dose is recommended after age 23. Talk to your health care provider about which screenings and vaccines you need and how often you need them. This information is not intended to replace advice given to you by your health care provider. Make sure you discuss any questions you have with your health care provider. Document Released: 03/14/2015 Document Revised: 11/05/2015 Document Reviewed: 12/17/2014 Elsevier Interactive Patient Education  2017 [employer].  Fall Prevention in the Home Falls can cause injuries. They can happen to people of all ages. There are many things you can do to make your home safe and to help prevent falls. What can I do on the outside of my home? Regularly fix the edges of walkways and driveways and fix any cracks. Remove anything that might make you trip as you walk through a door, such as a raised step or threshold. Trim any bushes or trees on the path to your home. Use bright outdoor lighting. Clear any walking paths of anything that might make someone trip, such as rocks or tools. Regularly check to see if handrails are loose or broken. Make sure that both sides of any steps have handrails. Any raised decks and porches should have guardrails on the edges. Have any leaves, snow, or ice cleared regularly. Use sand or salt on walking paths during winter. Clean up any spills in your garage right away. This includes oil or grease spills. What can I do in the bathroom? Use night lights. Install grab bars by the toilet and in the tub and shower. Do not use towel bars as grab bars. Use non-skid mats or decals in the tub or shower. If you need to sit down in the shower, use a plastic, non-slip stool. Keep the floor dry. Clean up any water that spills on the floor as soon as it happens. Remove soap buildup in the tub or shower  regularly. Attach bath mats securely with double-sided non-slip rug tape. Do not have throw rugs and other things on the floor that can make you trip. What can I do in the bedroom? Use night lights. Make sure that you have a light by your bed that is easy to reach. Do not use any sheets or blankets that are too big for your bed. They should not hang down onto the floor. Have a firm chair that has side arms. You can use this for support while you get dressed. Do not have throw rugs and other things on the floor that can make you trip. What can I do in the kitchen? Clean up any spills right away. Avoid walking on wet floors. Keep items that you use a lot in easy-to-reach places. If you need to reach something above you, use a strong step stool that has a grab bar. Keep electrical cords out of the way. Do not use floor polish or wax that makes floors slippery. If you must use wax, use non-skid floor wax. Do not have throw rugs and other things on the floor that can make you trip. What can I do with my stairs? Do not  leave any items on the stairs. Make sure that there are handrails on both sides of the stairs and use them. Fix handrails that are broken or loose. Make sure that handrails are as long as the stairways. Check any carpeting to make sure that it is firmly attached to the stairs. Fix any carpet that is loose or worn. Avoid having throw rugs at the top or bottom of the stairs. If you do have throw rugs, attach them to the floor with carpet tape. Make sure that you have a light switch at the top of the stairs and the bottom of the stairs. If you do not have them, ask someone to add them for you. What else can I do to help prevent falls? Wear shoes that: Do not have high heels. Have rubber bottoms. Are comfortable and fit you well. Are closed at the toe. Do not wear sandals. If you use a stepladder: Make sure that it is fully opened. Do not climb a closed stepladder. Make sure that  both sides of the stepladder are locked into place. Ask someone to hold it for you, if possible. Clearly mark and make sure that you can see: Any grab bars or handrails. First and last steps. Where the edge of each step is. Use tools that help you move around (mobility aids) if they are needed. These include: Canes. Walkers. Scooters. Crutches. Turn on the lights when you go into a dark area. Replace any light bulbs as soon as they burn out. Set up your furniture so you have a clear path. Avoid moving your furniture around. If any of your floors are uneven, fix them. If there are any pets around you, be aware of where they are. Review your medicines with your doctor. Some medicines can make you feel dizzy. This can increase your chance of falling. Ask your doctor what other things that you can do to help prevent falls. This information is not intended to replace advice given to you by your health care provider. Make sure you discuss any questions you have with your health care provider. Document Released: 12/12/2008 Document Revised: 07/24/2015 Document Reviewed: 03/22/2014 Elsevier Interactive Patient Education  2017 [employer].

## 2023-12-07 NOTE — Progress Notes (Signed)
 Subjective:   Jacob Graves is a 68 y.o. male who presents for an Initial Medicare Annual Wellness Visit.  Visit Complete: Virtual I connected with  Jacob Graves on 12/07/23 by a audio enabled telemedicine application and verified that I am speaking with the correct person using two identifiers.  Patient Location: Home  Provider Location: Office/Clinic  I discussed the limitations of evaluation and management by telemedicine. The patient expressed understanding and agreed to proceed.  Vital Signs: Because this visit was a virtual/telehealth visit, some criteria may be missing or patient reported. Any vitals not documented were not able to be obtained and vitals that have been documented are patient reported.   Cardiac Risk Factors include: advanced age (>58men, >80 women);dyslipidemia;male gender;smoking/ tobacco exposure;sedentary lifestyle     Objective:    Today's Vitals   12/07/23 0856 12/07/23 0857  Weight: 174 lb (78.9 kg)   Height: 5' 10 (1.778 m)   PainSc:  0-No pain   Body mass index is 24.97 kg/m.     12/07/2023    9:01 AM 02/08/2022    2:04 PM 11/16/2019    7:13 PM  Advanced Directives  Does Patient Have a Medical Advance Directive? No No Yes  Type of Advance Directive   Healthcare Power of Attorney  Would patient like information on creating a medical advance directive? No - Patient declined Yes (MAU/Ambulatory/Procedural Areas - Information given)     Current Medications (verified) Outpatient Encounter Medications as of 12/07/2023  Medication Sig   albuterol  (VENTOLIN  HFA) 108 (90 Base) MCG/ACT inhaler INHALE 2 PUFFS BY MOUTH EVERY 6 HOURS ASNEEDED FOR SHORT OF BREATH OR WHEEZING   Aspirin-Acetaminophen-Caffeine 500-325-65 MG PACK Take 325 mg by mouth 2 (two) times daily as needed.   Buprenorphine HCl-Naloxone HCl 8-2 MG FILM Place under the tongue every other day. (Patient taking differently: Place under the tongue every other day. Taking 1/2 tablet every  day)   fexofenadine  (ALLEGRA ) 180 MG tablet Take 1 tablet (180 mg total) by mouth daily.   fluticasone  (FLONASE ) 50 MCG/ACT nasal spray INHALE 1 SPRAY IN EACH NOSTRIL EVERY DAY   gabapentin  (NEURONTIN ) 600 MG tablet TAKE ONE TABLET BY MOUTH EVERY MORNING AT NOON  IN THE EVENING  AND AT BEDTIME   GINSENG PO Take by mouth.   ibuprofen  (ADVIL ) 800 MG tablet TAKE ONE TABLET BY MOUTH 3 TIMES A DAY   MAGNESIUM ASPARTATE PO Take by mouth.   meclizine  (ANTIVERT ) 25 MG tablet TAKE ONE TABLET BY MOUTH 3 TIMES A DAY AS NEEDED FOR DIZZINESS   mirabegron  ER (MYRBETRIQ ) 25 MG TB24 tablet Take 1 tablet (25 mg total) by mouth daily.   montelukast  (SINGULAIR ) 10 MG tablet TAKE ONE TABLET BY MOUTH EVERY DAY   NON FORMULARY Miracle Moo   ondansetron  (ZOFRAN ) 4 MG tablet TAKE ONE TABLET BY MOUTH EVERY 8 HOURS AS NEEDED FOR NAUSEA/VOMITING   pantoprazole  (PROTONIX ) 40 MG tablet TAKE 1 TABLET BY MOUTH DAILY   SPIRIVA  RESPIMAT 2.5 MCG/ACT AERS INHALE 2 PUFFS BY MOUTH EVERY DAY   traZODone  (DESYREL ) 50 MG tablet TAKE 1/2-1 TABLET AT BEDTIME AS NEEDED FOR SLEEP   valsartan  (DIOVAN ) 80 MG tablet TAKE ONE TABLET BY MOUTH EVERY DAY   No facility-administered encounter medications on file as of 12/07/2023.    Allergies (verified) Codeine, Duloxetine, Fentanyl, Fluoxetine, Ipratropium-albuterol , Zoloft [sertraline], and Penicillins   History: Past Medical History:  Diagnosis Date   Apnea    Arthritis    Arthritis  spinal arthritis   Cancer (HCC)    melanoma, basal cell   Centrilobular emphysema (HCC) 07/08/2015   Depression    Emphysema of lung (HCC)    COPD   GERD (gastroesophageal reflux disease)    Hypercholesterolemia    Hypertension    Impaired fasting glucose    Kyphosis    mild   Migraine    Neuropathy    Osteoarthritis    Other testicular dysfunction    Pneumonia of right lower lobe due to infectious organism 12/18/2022   Scoliosis    Sleep apnea    Solitary pulmonary nodule    Tinnitus     Past Surgical History:  Procedure Laterality Date   COLONOSCOPY  2016   Colon polyps. Tubular Adenoma.   TONSILLECTOMY     Family History  Problem Relation Age of Onset   Heart disease Father    Stroke Father    Breast cancer Maternal Grandmother    Leukemia Mother    Skin cancer Mother    Diabetes Mother    Stroke Mother    Hypertension Mother    Skin cancer Brother    Cancer Brother        Melanoma   Hypertension Brother    Colon cancer Neg Hx    Social History   Socioeconomic History   Marital status: Married    Spouse name: Alisa   Number of children: 2   Years of education: 12   Highest education level: Not on file  Occupational History   Occupation: disabled  Tobacco Use   Smoking status: Former    Average packs/day: 0.5 packs/day for 51.1 years (25.5 ttl pk-yrs)    Types: Cigarettes    Start date: 1974   Smokeless tobacco: Never   Tobacco comments:    Stopped smoking 03/2023    For years smoked 1 1/2 ppd   Vaping Use   Vaping status: Never Used  Substance and Sexual Activity   Alcohol use: No    Alcohol/week: 0.0 standard drinks of alcohol   Drug use: No   Sexual activity: Not Currently  Other Topics Concern   Not on file  Social History Narrative   Lives with wife   Caffeine- coffee, 3 cups daily   Social Drivers of Health   Financial Resource Strain: Low Risk  (03/22/2023)   [facility]   Overall Financial Resource Strain (CARDIA)    Difficulty of Paying Living Expenses: Not hard at all  Food Insecurity: No Food Insecurity (03/22/2023)   Received [facility]   Hunger Vital Sign    Within the past 12 months, you worried that your food would run out before you got the money to buy more.: Never true    Within the past 12 months, the food you bought just didn't last and you didn't have money to get more.: Never true  Transportation Needs: No Transportation Needs (03/22/2023)   [facility]   PRAPARE -  Transportation    Lack of Transportation (Medical): No    Lack of Transportation (Non-Medical): No  Physical Activity: Inactive (08/31/2022)   Exercise Vital Sign    Days of Exercise per Week: 0 days    Minutes of Exercise per Session: 0 min  Stress: No Stress Concern Present (11/10/2022)   Received from [facility] Occupational [facility] - Occupational Stress Questionnaire    Feeling of Stress : Only a little  Social Connections:  Socially Integrated (11/18/2022)   Social Connection and Isolation Panel    Frequency of Communication with Friends and Family: More than three times a week    Frequency of Social Gatherings with Friends and Family: Once a week    Attends Religious Services: More than 4 times per year    Active Member of [employer] or Organizations: Yes    Attends Engineer, structural: More than 4 times per year    Marital Status: Married    Tobacco Counseling Counseling given: Not Answered Tobacco comments: Stopped smoking 03/2023 For years smoked 1 1/2 ppd    Clinical Intake:  Pre-visit preparation completed: No  Pain : No/denies pain Pain Score: 0-No pain     BMI - recorded: 24.97 Nutritional Status: BMI of 19-24  Normal Nutritional Risks: None Diabetes: No  How often do you need to have someone help you when you read instructions, pamphlets, or other written materials from your doctor or pharmacy?: 1 - Never What is the last grade level you completed in school?: some college  Interpreter Needed?: No      Activities of Daily Living    12/07/2023    9:02 AM  In your present state of health, do you have any difficulty performing the following activities:  Hearing? 0  Comment wears hearing aids  Vision? 0  Comment wears glasses  Difficulty concentrating or making decisions? 0  Walking or climbing stairs? 0  Dressing or bathing? 0  Doing errands, shopping? 0  Preparing Food and eating ? N  Using the Toilet? N  In the past  six months, have you accidently leaked urine? --  Comment patient refused  Do you have problems with loss of bowel control? --  Comment patient refused  Managing your Medications? --  Comment patient refused  Managing your Finances? --  Comment patient refused  Housekeeping or managing your Housekeeping? --  Comment patient refused    Patient Care Team: Sherre Clapper, MD as PCP - General (Family Medicine) Trudy Lynwood DASEN, MD as Referring Physician (Dermatology) Geronimo Amel, MD as Consulting Physician (Pulmonary Disease) Delores Lauraine NOVAK, [facility] (Inactive) as Pharmacist (Pharmacist)  Indicate any recent Medical Services you may have received from other than [facility] providers in the past year (date may be approximate).     Assessment:   This is a routine wellness examination for Jacob Graves.  Hearing/Vision screen No results found.   Goals Addressed               This Visit's Progress     Patient Stated (pt-stated)        Patient stated his goal is to make it through another year.       Depression Screen    12/07/2023    9:01 AM 08/21/2023    4:35 PM 10/28/2022    8:43 AM 08/31/2022    9:17 AM 02/08/2022    2:01 PM 08/19/2021   11:03 AM 10/22/2020    4:21 PM  PHQ 2/9 Scores  PHQ - 2 Score 0 0 0 3 0 0 1  PHQ- 9 Score 1  10 12   7     Fall Risk    12/07/2023    9:01 AM 10/28/2022    8:43 AM 08/31/2022    9:17 AM 02/08/2022    2:01 PM 08/19/2021   11:03 AM  Fall Risk   Falls in the past year? 0 1 0 0 1  Number falls in past yr: 0 0 0 0  1  Injury with Fall? 0 0 0 0 1  Risk for fall due to : No Fall Risks Impaired balance/gait No Fall Risks  Impaired mobility  Follow up Falls evaluation completed Falls evaluation completed;Falls prevention discussed Falls evaluation completed Falls evaluation completed       Data saved with a previous flowsheet row definition     MEDICARE RISK AT HOME: Medicare Risk at Home Any stairs in or around the home?:  (patient refused) If so,  are there any without handrails?:  (patient refused) Home free of loose throw rugs in walkways, pet beds, electrical cords, etc?:  (patient refused) Adequate lighting in your home to reduce risk of falls?:  (patient refused) Life alert?:  (patient refused) Use of a cane, walker or w/c?:  (patient refused) Grab bars in the bathroom?:  (patient refused) Shower chair or bench in shower?:  (patient refused) Elevated toilet seat or a handicapped toilet?:  (patient refused)   Cognitive Function:    11/14/2019   10:00 AM  MMSE - Mini Mental State Exam  Orientation to time 5  Orientation to Place 5  Registration 3  Attention/ Calculation 4  Recall 2  Language- name 2 objects 2  Language- repeat 1  Language- follow 3 step command 3  Language- read & follow direction 1  Write a sentence 1  Copy design 1  Total score 28        12/07/2023    9:07 AM  6CIT Screen  What Year? --  What month? --  What time? --  Count back from 20 --  Months in reverse --  Repeat phrase --    Immunizations Immunization History  Administered Date(s) Administered   Influenza,inj,Quad PF,6+ Mos 09/30/2014   Pneumococcal Conjugate-13 07/17/2015   Pneumococcal Polysaccharide-23 10/26/2017   Tdap 10/15/2014    TDAP status: Up to date  Flu Vaccine status: Declined, Education has been provided regarding the importance of this vaccine but patient still declined. Advised may receive this vaccine at local pharmacy or Health Dept. Aware to provide a copy of the vaccination record if obtained from local pharmacy or Health Dept. Verbalized acceptance and understanding.  Pneumococcal vaccine status: Up to date  Covid-19 vaccine status: Declined, Education has been provided regarding the importance of this vaccine but patient still declined. Advised may receive this vaccine at local pharmacy or Health Dept.or vaccine clinic. Aware to provide a copy of the vaccination record if obtained from local pharmacy or  Health Dept. Verbalized acceptance and understanding.  Qualifies for Shingles Vaccine? No   Zostavax completed No   Shingrix Completed?: No.    Education has been provided regarding the importance of this vaccine. Patient has been advised to call insurance company to determine out of pocket expense if they have not yet received this vaccine. Advised may also receive vaccine at local pharmacy or Health Dept. Verbalized acceptance and understanding.  Screening Tests Health Maintenance  Topic Date Due   Zoster Vaccines- Shingrix (1 of 2) Never done   Lung Cancer Screening  11/11/2021   Influenza Vaccine  05/29/2024 (Originally 09/30/2023)   Pneumococcal Vaccine: 50+ Years (3 of 3 - PCV20 or PCV21) 11/24/2024 (Originally 10/27/2022)   DTaP/Tdap/Td (2 - Td or Tdap) 10/14/2024   Medicare Annual Wellness (AWV)  12/06/2024   Colonoscopy  06/03/2031   Hepatitis C Screening  Completed   Meningococcal B Vaccine  Aged Out   COVID-19 Vaccine  Discontinued    Health Maintenance  Health Maintenance Due  Topic Date Due   Zoster Vaccines- Shingrix (1 of 2) Never done   Lung Cancer Screening  11/11/2021    Colorectal cancer screening: Type of screening: Colonoscopy. Completed 06/02/2021. Repeat every 5 years  Lung Cancer Screening: (Low Dose CT Chest recommended if Age 20-80 years, 20 pack-year currently smoking OR have quit w/in 15years.) does qualify.   Lung Cancer Screening to be done 10.15.2025 at [facility]  Additional Screening:  Vision Screening: Recommended annual ophthalmology exams for early detection of glaucoma and other disorders of the eye. Is the patient up to date with their annual eye exam?  patient refused to answer.   Dental Screening: Recommended annual dental exams for proper oral hygiene    Plan:     I have personally reviewed and noted the following in the patient's chart:   Medical and social history Use of alcohol, tobacco or illicit drugs  Current  medications and supplements including opioid prescriptions. Patient is currently taking opioid prescriptions. Information provided to patient regarding non-opioid alternatives. Patient advised to discuss non-opioid treatment plan with their provider. Functional ability and status Nutritional status Physical activity Advanced directives List of other physicians Hospitalizations, surgeries, and ER visits in previous 12 months Vitals Screenings to include cognitive, depression, and falls Referrals and appointments  In addition, I have reviewed and discussed with patient certain preventive protocols, quality metrics, and best practice recommendations. A written personalized care plan for preventive services as well as general preventive health recommendations were provided to patient.     Jacob Graves, [place]   12/07/2023   After Visit Summary: (Declined) Due to this being a telephonic visit, with patients personalized plan was offered to patient but patient Declined AVS at this time   Nurse Notes: I spent 8 minutes on the phone with this patient and he answered some questions but when I got to the last couple sections the patient refused and stated you don't need to ask me anymore questions, I'm fine and everything is fine here.

## 2023-12-09 ENCOUNTER — Other Ambulatory Visit: Payer: Self-pay | Admitting: Family Medicine

## 2023-12-09 ENCOUNTER — Telehealth: Payer: Self-pay | Admitting: Family Medicine

## 2023-12-09 NOTE — Telephone Encounter (Signed)
 Copied from CRM [id number]. Topic: Clinical - Medication Refill >> Dec 09, 2023 11:59 AM Tiffini S wrote: Medication: gabapentin  (NEURONTIN ) 600 MG tablet- Patient is out of medication and is trying to pick up medication today   Has the patient contacted their pharmacy? Yes (Agent: If no, request that the patient contact the pharmacy for the refill. If patient does not wish to contact the pharmacy document the reason why and proceed with request.) (Agent: If yes, when and what did the pharmacy advise?)  This is the patient's preferred pharmacy:  [address] [place], [place] - [address] [place] [address] Phone: [phone] Fax: [phone]   Is this the correct pharmacy for this prescription? Yes If no, delete pharmacy and type the correct one.   Has the prescription been filled recently? Yes  Is the patient out of the medication? Yes  Has the patient been seen for an appointment in the last year OR does the patient have an upcoming appointment? Yes  Can we respond through MyChart? No, please call at [phone]  Agent: Please be advised that Rx refills may take up to 3 business days. We ask that you follow-up with your pharmacy.

## 2023-12-09 NOTE — Telephone Encounter (Unsigned)
 Copied from CRM [id number]. Topic: Clinical - Prescription Issue >> Dec 09, 2023 11:50 AM Tiffini S wrote: Reason for CRM: Patient spouse Alisa called about the medication for gabapentin  (NEURONTIN ) 600 MG tablet- he wants to pick up the medication from [facility] but insurance states that prescription will not be refillable until 02/13/24 and they will not pay for the medicine. He is completely out of the tablets.  Please call Alisa at [phone] to an update.

## 2023-12-12 MED ORDER — GABAPENTIN 600 MG PO TABS: 600.0000 mg | ORAL_TABLET | Freq: Three times a day (TID) | ORAL | 1 refills | Status: AC

## 2023-12-12 NOTE — Telephone Encounter (Signed)
 Pharmacy said that patient pick up the medication on 12/09/23.

## 2023-12-14 ENCOUNTER — Ambulatory Visit (HOSPITAL_BASED_OUTPATIENT_CLINIC_OR_DEPARTMENT_OTHER)
Admission: RE | Admit: 2023-12-14 | Discharge: 2023-12-14 | Disposition: A | Source: Ambulatory Visit | Attending: Family Medicine | Admitting: Family Medicine

## 2023-12-14 ENCOUNTER — Other Ambulatory Visit: Payer: Self-pay | Admitting: Family Medicine

## 2023-12-14 DIAGNOSIS — Z87891 Personal history of nicotine dependence: Secondary | ICD-10-CM | POA: Diagnosis not present

## 2023-12-19 ENCOUNTER — Ambulatory Visit: Payer: Self-pay | Admitting: Family Medicine

## 2023-12-19 DIAGNOSIS — R918 Other nonspecific abnormal finding of lung field: Secondary | ICD-10-CM

## 2024-01-06 ENCOUNTER — Other Ambulatory Visit: Payer: Self-pay | Admitting: Family Medicine

## 2024-01-10 ENCOUNTER — Telehealth: Payer: Self-pay | Admitting: Family Medicine

## 2024-01-10 ENCOUNTER — Other Ambulatory Visit: Payer: Self-pay

## 2024-01-10 DIAGNOSIS — K219 Gastro-esophageal reflux disease without esophagitis: Secondary | ICD-10-CM

## 2024-01-10 MED ORDER — PANTOPRAZOLE SODIUM 40 MG PO TBEC: 40.0000 mg | DELAYED_RELEASE_TABLET | Freq: Two times a day (BID) | ORAL | 1 refills | Status: AC

## 2024-01-10 NOTE — Telephone Encounter (Signed)
 [facility] [phone]

## 2024-01-13 ENCOUNTER — Other Ambulatory Visit: Payer: Self-pay | Admitting: Family Medicine

## 2024-02-07 ENCOUNTER — Telehealth: Payer: Self-pay

## 2024-02-07 DIAGNOSIS — I10 Essential (primary) hypertension: Secondary | ICD-10-CM

## 2024-02-07 NOTE — Progress Notes (Signed)
 Pharmacy Quality Measure Review  This patient is appearing on a report for being at risk of failing the adherence measure for hypertension (ACEi/ARB) medications this calendar year.   Medication: Valsartan  80mg  Last fill date: 09/13 for 30 day supply  Spoke with patient and his wife. States that patient takes daily and has sufficient refills. No issues currently with this medication. All questions were answered.  Janel Beane, PharmD [facility] Pharmacist

## 2024-03-02 ENCOUNTER — Other Ambulatory Visit: Payer: Self-pay | Admitting: Family Medicine

## 2024-03-20 ENCOUNTER — Other Ambulatory Visit: Payer: Self-pay | Admitting: Family Medicine

## 2024-03-28 ENCOUNTER — Ambulatory Visit: Admitting: Family Medicine

## 2024-04-02 ENCOUNTER — Encounter: Payer: Self-pay | Admitting: Family Medicine

## 2024-04-02 ENCOUNTER — Telehealth: Admitting: Family Medicine

## 2024-04-02 ENCOUNTER — Other Ambulatory Visit: Payer: Self-pay | Admitting: Family Medicine

## 2024-04-02 DIAGNOSIS — T402X5A Adverse effect of other opioids, initial encounter: Secondary | ICD-10-CM

## 2024-04-02 DIAGNOSIS — I10 Essential (primary) hypertension: Secondary | ICD-10-CM

## 2024-04-02 DIAGNOSIS — M609 Myositis, unspecified: Secondary | ICD-10-CM | POA: Diagnosis not present

## 2024-04-02 DIAGNOSIS — N3281 Overactive bladder: Secondary | ICD-10-CM | POA: Diagnosis not present

## 2024-04-02 DIAGNOSIS — K5903 Drug induced constipation: Secondary | ICD-10-CM

## 2024-04-02 DIAGNOSIS — K219 Gastro-esophageal reflux disease without esophagitis: Secondary | ICD-10-CM

## 2024-04-02 DIAGNOSIS — J301 Allergic rhinitis due to pollen: Secondary | ICD-10-CM

## 2024-04-02 DIAGNOSIS — E782 Mixed hyperlipidemia: Secondary | ICD-10-CM

## 2024-04-02 DIAGNOSIS — J432 Centrilobular emphysema: Secondary | ICD-10-CM | POA: Diagnosis not present

## 2024-04-02 DIAGNOSIS — G4733 Obstructive sleep apnea (adult) (pediatric): Secondary | ICD-10-CM

## 2024-04-02 DIAGNOSIS — R7303 Prediabetes: Secondary | ICD-10-CM

## 2024-04-02 DIAGNOSIS — T466X5A Adverse effect of antihyperlipidemic and antiarteriosclerotic drugs, initial encounter: Secondary | ICD-10-CM

## 2024-04-02 MED ORDER — BUDESONIDE-FORMOTEROL FUMARATE 160-4.5 MCG/ACT IN AERO: 2.0000 | INHALATION_SPRAY | Freq: Two times a day (BID) | RESPIRATORY_TRACT | 3 refills | Status: AC

## 2024-04-02 MED ORDER — FLUTICASONE PROPIONATE 50 MCG/ACT NA SUSP: 2.0000 | Freq: Every day | NASAL | 2 refills | Status: AC

## 2024-04-02 MED ORDER — OXYBUTYNIN CHLORIDE ER 5 MG PO TB24: 5.0000 mg | ORAL_TABLET | Freq: Every day | ORAL | 0 refills | Status: AC

## 2024-04-02 NOTE — Assessment & Plan Note (Signed)
 COPD management adjusted due to insurance coverage issues with Spiriva . Symbicort  covered by insurance. - Prescribed Symbicort  inhaler, 90-day supply, to [facility].

## 2024-04-02 NOTE — Assessment & Plan Note (Signed)
 Experiences daily acid reflux. Discussed dietary modifications. Omeprazole  was ineffective. - Continue pantoprazole  40 mg twice daily. - Reduce caffeine intake and consider decaffeinated beverages. - Monitor symptoms and adjust treatment as needed.

## 2024-04-02 NOTE — Assessment & Plan Note (Signed)
 A1c at 5.5, indicating good control. Managed through diet and exercise. - Continue current diet and exercise regimen. - Scheduled follow-up blood work to monitor A1c and other parameters.

## 2024-04-02 NOTE — Assessment & Plan Note (Signed)
 Continue flonase .  Orders:   fluticasone  (FLONASE ) 50 MCG/ACT nasal spray; Place 2 sprays into both nostrils daily.

## 2024-04-02 NOTE — Assessment & Plan Note (Signed)
 Well controlled Continue Valsartan  80 mg by mouth once daily. Continue to work on eating a healthy diet and exercise.

## 2024-04-02 NOTE — Assessment & Plan Note (Signed)
Intolerant to cpap.  

## 2024-04-10 ENCOUNTER — Ambulatory Visit

## 2024-06-15 ENCOUNTER — Encounter (HOSPITAL_BASED_OUTPATIENT_CLINIC_OR_DEPARTMENT_OTHER): Admitting: Radiology

## 2024-12-12 ENCOUNTER — Ambulatory Visit
